# Patient Record
Sex: Male | Born: 1935 | Race: Black or African American | Hispanic: No | Marital: Married | Smoking: Former smoker
Health system: Southern US, Community
[De-identification: ages and names within clinical notes are randomized; demographics above are authoritative.]

## PROBLEM LIST (undated history)

## (undated) DIAGNOSIS — J309 Allergic rhinitis, unspecified: Secondary | ICD-10-CM

## (undated) DIAGNOSIS — K6389 Other specified diseases of intestine: Secondary | ICD-10-CM

## (undated) DIAGNOSIS — J439 Emphysema, unspecified: Secondary | ICD-10-CM

## (undated) DIAGNOSIS — I10 Essential (primary) hypertension: Secondary | ICD-10-CM

## (undated) DIAGNOSIS — G629 Polyneuropathy, unspecified: Secondary | ICD-10-CM

## (undated) DIAGNOSIS — R269 Unspecified abnormalities of gait and mobility: Secondary | ICD-10-CM

## (undated) DIAGNOSIS — I4510 Unspecified right bundle-branch block: Secondary | ICD-10-CM

## (undated) HISTORY — DX: Essential (primary) hypertension: I10

## (undated) HISTORY — DX: Polyneuropathy, unspecified: G62.9

## (undated) HISTORY — DX: Unspecified abnormalities of gait and mobility: R26.9

---
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
## 2011-08-25 ENCOUNTER — Encounter (HOSPITAL_COMMUNITY): Payer: Self-pay | Admitting: Emergency Medicine

## 2011-08-25 ENCOUNTER — Emergency Department (INDEPENDENT_AMBULATORY_CARE_PROVIDER_SITE_OTHER)
Admission: EM | Admit: 2011-08-25 | Discharge: 2011-08-25 | Disposition: A | Discharge status: Home Health | Payer: Medicare Other | Source: Home / Self Care | Attending: Family Medicine | Admitting: Family Medicine

## 2011-08-25 DIAGNOSIS — H6980 Other specified disorders of Eustachian tube, unspecified ear: Secondary | ICD-10-CM

## 2011-08-25 DIAGNOSIS — J309 Allergic rhinitis, unspecified: Secondary | ICD-10-CM

## 2011-08-25 MED ORDER — LORATADINE 10 MG PO TABS: 10.0000 mg | ORAL_TABLET | Freq: Every day | ORAL | Status: DC

## 2011-08-25 MED ORDER — FLUTICASONE PROPIONATE 50 MCG/ACT NA SUSP: 2.0000 | Freq: Every day | NASAL | Status: DC

## 2011-08-25 NOTE — ED Provider Notes (Signed)
History     CSN: [id number]  Arrival date & time 08/25/11  0904   First MD Initiated Contact with Patient 08/25/11 1003      Chief Complaint  Patient presents with  . Dizziness  . Allergies    (Consider location/radiation/quality/duration/timing/severity/associated sxs/prior treatment) HPI Comments: Darryl Johns presents for evaluation of nasal congestion, right ear fullness, and red, watery eyes. He reports a symptoms over the last month they have worsened over the last week. He denies any history of seasonal allergies. But under further review. He does admit to be symptoms recurring with changes of season or temperature. He denies any fever. He denies any cough.  Patient is a 77 y.o. male presenting with URI. The history is provided by the patient.  URI The primary symptoms include headaches and ear pain. Primary symptoms do not include fever, sore throat, cough, nausea or vomiting. The current episode started more than 1 week ago. The problem has not changed since onset. The ear pain began more than 2 days ago. Ear pain is a new problem. The right ear is affected.  Symptoms associated with the illness include plugged ear sensation, sinus pressure and congestion. The illness is not associated with chills.    History reviewed. No pertinent past medical history.  History reviewed. No pertinent past surgical history.  No family history on file.  History  Substance Use Topics  . Smoking status: Current Everyday Smoker  . Smokeless tobacco: Not on file  . Alcohol Use: Yes      Review of Systems  Constitutional: Negative.  Negative for fever and chills.  HENT: Positive for ear pain, congestion and sinus pressure. Negative for hearing loss and sore throat.   Eyes: Positive for redness.  Respiratory: Negative.  Negative for cough.   Cardiovascular: Negative.   Gastrointestinal: Negative.  Negative for nausea and vomiting.  Genitourinary: Negative.   Musculoskeletal: Negative.     Skin: Negative.   Neurological: Positive for headaches.    Allergies  Review of patient's allergies indicates no known allergies.  Home Medications   Current Outpatient Rx  Name Route Sig Dispense Refill  . FLUTICASONE PROPIONATE 50 MCG/ACT NA SUSP Nasal Place 2 sprays into the nose daily. 16 g 2  . LORATADINE 10 MG PO TABS Oral Take 1 tablet (10 mg total) by mouth daily. 30 tablet 0    BP 146/67  Pulse 84  Temp(Src) 97.5 F (36.4 C) (Oral)  Resp 16  SpO2 100%  Physical Exam  Nursing note and vitals reviewed. Constitutional: He is oriented to person, place, and time. He appears well-developed and well-nourished.  HENT:  Head: Normocephalic and atraumatic.  Right Ear: Tympanic membrane is retracted.  Left Ear: Tympanic membrane is retracted.  Mouth/Throat: Uvula is midline, oropharynx is clear and moist and mucous membranes are normal.  Eyes: EOM are normal.  Neck: Normal range of motion.  Cardiovascular: Normal rate and regular rhythm.   Pulmonary/Chest: Effort normal and breath sounds normal. He has no wheezes. He has no rales.  Musculoskeletal: Normal range of motion.  Neurological: He is alert and oriented to person, place, and time.  Skin: Skin is warm and dry.  Psychiatric: His behavior is normal.    ED Course  Procedures (including critical care time)  Labs Reviewed - No data to display No results found.   1. Eustachian tube dysfunction   2. Allergic rhinitis       MDM  rx given for fluticasone and loratadine  Richardo Priest, MD 08/25/11 1123

## 2011-08-25 NOTE — ED Notes (Signed)
PT HERE WITH SX RIGHT EAR THROB,SHARP PAINS WITH WATERY RED EYES AND DIZZINESS THAT HAS BEEN ONGOING BUT HAS  WORSENED IN THE LAST WEEK.DENIES N/V.PT WENT TO PCP 6 MNTHS AGO AND HAD EAR IRRIGATION.Marland Kitchen

## 2011-08-25 NOTE — Discharge Instructions (Signed)
Use the prescribed nasal spray as directed and continue loratadine daily for the next week or so. Use an over the counter saline solution or eye moisturizer such as Natural Tears. Also, stay hydrated with clear liquids. Return to care should your symptoms not improve, or worsen in any way, such as fever.

## 2014-12-15 ENCOUNTER — Telehealth: Payer: Self-pay | Admitting: *Deleted

## 2014-12-15 ENCOUNTER — Telehealth: Payer: Self-pay | Admitting: Internal Medicine

## 2014-12-15 NOTE — Telephone Encounter (Signed)
Received records from [facility] for appointment with Dr Debara Pickett on 12/22/14.  Records given to [facility] (medical records) for Dr Lysbeth Penner schedule on 12/22/14. lp

## 2014-12-15 NOTE — Telephone Encounter (Signed)
Incoming call from Dr. Kristie Cowman for new patient referral.  New patient for Dr. Ronnald Ramp, presented today w/ c/o dizziness upon standing. Pt has no medical history.   Dr. Ronnald Ramp noted concern for elevated BP (172/72). Did not initiate any meds today - also concerned for abnormal EKG and wanted to have cardiology evaluate. Requested visit here w/in 1 week if possible.  Reached out to patient, confirmed name/DOB/address/contact info - noted already has chart w/in our system d/t historical urgent care visit.  Spoke w/ Eliezer Lofts and Abe People, patient was added to Dr. Lysbeth Penner calendar for 6/27 for new patient visit.  Fax received today from [facility], will bring to medical records for scanning to electronic record.

## 2014-12-22 ENCOUNTER — Encounter: Payer: Self-pay | Admitting: Internal Medicine

## 2014-12-22 ENCOUNTER — Ambulatory Visit (INDEPENDENT_AMBULATORY_CARE_PROVIDER_SITE_OTHER): Payer: Medicare HMO | Admitting: Internal Medicine

## 2014-12-22 VITALS — BP 173/79 | HR 96 | Ht 74.0 in | Wt 173.0 lb

## 2014-12-22 DIAGNOSIS — I10 Essential (primary) hypertension: Secondary | ICD-10-CM | POA: Diagnosis not present

## 2014-12-22 DIAGNOSIS — R42 Dizziness and giddiness: Secondary | ICD-10-CM

## 2014-12-22 DIAGNOSIS — I4510 Unspecified right bundle-branch block: Secondary | ICD-10-CM

## 2014-12-22 MED ORDER — VALSARTAN-HYDROCHLOROTHIAZIDE 160-12.5 MG PO TABS: 1.0000 | ORAL_TABLET | Freq: Every day | ORAL | Status: DC

## 2014-12-22 NOTE — Patient Instructions (Addendum)
Your physician has requested that you have an echocardiogram. Echocardiography is a painless test that uses sound waves to create images of your heart. It provides your doctor with information about the size and shape of your heart and how well your heart's chambers and valves are working. This procedure takes approximately one hour. There are no restrictions for this procedure.  Your physician has recommended you make the following change in your medication: START valsartan-hctz - for blood pressure - take in the morning  Your physician recommends that you schedule a follow-up appointment after your test.

## 2014-12-23 ENCOUNTER — Encounter: Payer: Self-pay | Admitting: Internal Medicine

## 2014-12-23 DIAGNOSIS — R42 Dizziness and giddiness: Secondary | ICD-10-CM | POA: Insufficient documentation

## 2014-12-23 DIAGNOSIS — I10 Essential (primary) hypertension: Secondary | ICD-10-CM | POA: Insufficient documentation

## 2014-12-23 DIAGNOSIS — I4510 Unspecified right bundle-branch block: Secondary | ICD-10-CM | POA: Insufficient documentation

## 2014-12-23 NOTE — Progress Notes (Signed)
OFFICE NOTE  Chief Complaint:  Dizziness, abnormal EKG  Primary Care Physician: Gennette Pac, MD  HPI:  Darryl Johns is a pleasant 80 year old male who is coming referred for evaluation of an abnormal EKG and dizziness. Recently he's been noted to be hypertensive with blood pressures the 833A to 250N systolic. He is also reported dizziness when standing and difficulty with a normal gait 1 walking. He reports he is actually more dizzy when laying down in bed at night rather than sitting up. Orthostatic blood pressures were obtained and they were 189/94 sitting, 194/77 lying and 173/79 standing. There is no significant difference in pulse change. He did not become dizzy when sitting up. He does report however standing up and walking for a few steps he becomes dizzy. He denies any chest pain or worsening shortness of breath. He occasionally has headache and does take Advil. He was also noted on a recent EKG to have an RSR pattern which is suggestive of right bundle branch block.  PMHx:  Past Medical History  Diagnosis Date  . Hypertension     No past surgical history on file.  FAMHx:  Family History  Problem Relation Age of Onset  . Stroke Mother     SOCHx:   reports that he has been smoking.  He does not have any smokeless tobacco history on file. He reports that he drinks alcohol. He reports that he does not use illicit drugs.  ALLERGIES:  No Known Allergies  ROS: A comprehensive review of systems was negative except for: Neurological: positive for dizziness and gait problems  HOME MEDS: Current Outpatient Prescriptions  Medication Sig Dispense Refill  . Ibuprofen (ADVIL) 200 MG CAPS Take by mouth as needed.    . valsartan-hydrochlorothiazide (DIOVAN-HCT) 160-12.5 MG per tablet Take 1 tablet by mouth daily. 30 tablet 6   No current facility-administered medications for this visit.    LABS/IMAGING: No results found for this or any previous visit (from the past  48 hour(s)). No results found.  WEIGHTS: Wt Readings from Last 3 Encounters:  12/22/14 173 lb (78.472 kg)    VITALS: BP 173/79 mmHg  Pulse 96  Ht 6\' 2"  (1.88 m)  Wt 173 lb (78.472 kg)  BMI 22.20 kg/m2  EXAM: General appearance: alert and no distress Neck: no carotid bruit and no JVD Lungs: clear to auscultation bilaterally Heart: regular rate and rhythm, S1, S2 normal, no murmur, click, rub or gallop Abdomen: soft, non-tender; bowel sounds normal; no masses,  no organomegaly Extremities: extremities normal, atraumatic, no cyanosis or edema Pulses: 2+ and symmetric Skin: Skin color, texture, turgor normal. No rashes or lesions Neurologic: Mental status: Alert, oriented, thought content appropriate Coordination: Romberg test abnormal Gait: Antalgic Psych: Pleasant  EKG: Normal sinus rhythm at 88, right bundle branch block  ASSESSMENT: 1. Abnormal gait and dizziness with an abnormal Romberg test, suggesting possible posterior column symptoms/neuropathy as a cause 2. History of long-term moderate alcohol use 3. Right bundle branch block 4. Uncontrolled hypertension  PLAN: 1.   Mr. Teo has uncontrolled hypertension which could be playing a role in some of his symptoms. I recommend starting him on Diovan HCTZ 160/12.5 mg daily. Neurologically, his exam is consistent with a possible posterior column or neuropathic cause of his dizziness. He also reports difficulty with gait. He does have a history of moderate alcohol use and may have some neuropathy that is leading to his imbalance. I suggest a neurologic evaluation for his dizziness and gait instability. He  is not describing orthostatic hypotension, in fact he is more dizzy when laying down rather than sitting up. His orthostatics in the office were negative. I've also recommend an echocardiogram to rule out a cardiomyopathy, especially given a history of alcohol use and his abnormal EKG.  Plan to see him back to discuss the  results of the echo and recheck his blood pressure and I will adjust his medication accordingly. Thanks as always for the kind referral.  Pixie Casino, MD, [facility] Attending Cardiologist [place] 12/23/2014, 6:19 PM

## 2015-01-01 ENCOUNTER — Ambulatory Visit (HOSPITAL_COMMUNITY)
Admission: RE | Admit: 2015-01-01 | Discharge: 2015-01-01 | Disposition: A | Discharge status: Home / Self Care | Payer: Medicare HMO | Source: Ambulatory Visit | Attending: Cardiology | Admitting: Cardiology

## 2015-01-01 DIAGNOSIS — I4510 Unspecified right bundle-branch block: Secondary | ICD-10-CM | POA: Diagnosis not present

## 2015-01-01 DIAGNOSIS — I10 Essential (primary) hypertension: Secondary | ICD-10-CM | POA: Diagnosis not present

## 2015-01-01 DIAGNOSIS — R42 Dizziness and giddiness: Secondary | ICD-10-CM

## 2015-01-16 ENCOUNTER — Ambulatory Visit (INDEPENDENT_AMBULATORY_CARE_PROVIDER_SITE_OTHER): Payer: Medicare HMO | Admitting: Internal Medicine

## 2015-01-16 ENCOUNTER — Encounter: Payer: Self-pay | Admitting: Internal Medicine

## 2015-01-16 VITALS — BP 148/74 | HR 88 | Ht 74.0 in | Wt 174.3 lb

## 2015-01-16 DIAGNOSIS — I4510 Unspecified right bundle-branch block: Secondary | ICD-10-CM

## 2015-01-16 DIAGNOSIS — I10 Essential (primary) hypertension: Secondary | ICD-10-CM | POA: Diagnosis not present

## 2015-01-16 DIAGNOSIS — R42 Dizziness and giddiness: Secondary | ICD-10-CM

## 2015-01-16 MED ORDER — VALSARTAN-HYDROCHLOROTHIAZIDE 160-12.5 MG PO TABS: 1.0000 | ORAL_TABLET | Freq: Every day | ORAL | Status: DC

## 2015-01-16 NOTE — Patient Instructions (Signed)
Your physician recommends that you schedule a follow-up appointment as needed  

## 2015-01-16 NOTE — Progress Notes (Signed)
OFFICE NOTE  Chief Complaint:  Dizziness, abnormal EKG  Primary Care Physician: Andria Frames, MD  HPI:  Darryl Johns is a pleasant 80 year old male who is coming referred for evaluation of an abnormal EKG and dizziness. Recently he's been noted to be hypertensive with blood pressures the 335K to 562B systolic. He is also reported dizziness when standing and difficulty with a normal gait 1 walking. He reports he is actually more dizzy when laying down in bed at night rather than sitting up. Orthostatic blood pressures were obtained and they were 189/94 sitting, 194/77 lying and 173/79 standing. There is no significant difference in pulse change. He did not become dizzy when sitting up. He does report however standing up and walking for a few steps he becomes dizzy. He denies any chest pain or worsening shortness of breath. He occasionally has headache and does take Advil. He was also noted on a recent EKG to have an RSR pattern which is suggestive of right bundle branch block.  Darryl Johns returns today with an improvement in his blood pressure on Diovan HCTZ. The blood pressure is 148/74. He's had no chest pain or shortness of breath. He still has some positional dizziness. There is no evidence for orthostatic hypotension. Echocardiogram shows normal systolic function mild diastolic dysfunction without any significant valvular disease. I suspect that his dizziness may be neurologic in origin.  PMHx:  Past Medical History  Diagnosis Date  . Hypertension     No past surgical history on file.  FAMHx:  Family History  Problem Relation Age of Onset  . Stroke Mother     SOCHx:   reports that he has been smoking.  He does not have any smokeless tobacco history on file. He reports that he drinks alcohol. He reports that he does not use illicit drugs.  ALLERGIES:  No Known Allergies  ROS: A comprehensive review of systems was negative except for: Neurological: positive for  dizziness and gait problems  HOME MEDS: Current Outpatient Prescriptions  Medication Sig Dispense Refill  . Ibuprofen (ADVIL) 200 MG CAPS Take by mouth as needed.    . valsartan-hydrochlorothiazide (DIOVAN-HCT) 160-12.5 MG per tablet Take 1 tablet by mouth daily. 30 tablet 11   No current facility-administered medications for this visit.    LABS/IMAGING: No results found for this or any previous visit (from the past 48 hour(s)). No results found.  WEIGHTS: Wt Readings from Last 3 Encounters:  01/16/15 174 lb 4.8 oz (79.062 kg)  12/22/14 173 lb (78.472 kg)    VITALS: BP 148/74 mmHg  Pulse 88  Ht 6\' 2"  (1.88 m)  Wt 174 lb 4.8 oz (79.062 kg)  BMI 22.37 kg/m2  EXAM: Deferred  EKG: Deferred  ASSESSMENT: 1. Abnormal gait and dizziness with an abnormal Romberg test, suggesting possible posterior column symptoms/neuropathy as a cause 2. History of long-term moderate alcohol use 3. Right bundle branch block - normal LVEF on echo with mild diastolic dysfunction 4. Hypertension - controlled  PLAN: 1.   Darryl Johns has normal systolic function mild diastolic dysfunction on his echo. There is no evidence for ischemia and he said no chest pain. His blood pressure now is better controlled on Diovan HCTZ. We have given a prescription to continue this. Follow-up with [place] can be on an as-needed basis. His primary care provider could continue to prescribe him his high blood pressure medication. He may likely need a referral to neurology for dizziness and I offered to do that today however  he would like Dr. Ronnald Ramp to do that form.  Thanks again for allowing me to participate in his care. I'm happy to see him back as necessary.  Pixie Casino, MD, [facility] Attending Cardiologist [place] 01/16/2015, 3:59 PM

## 2016-01-18 ENCOUNTER — Other Ambulatory Visit: Payer: Self-pay | Admitting: Internal Medicine

## 2016-12-19 ENCOUNTER — Other Ambulatory Visit: Payer: Self-pay | Admitting: Internal Medicine

## 2016-12-19 NOTE — Telephone Encounter (Signed)
REFILL 

## 2017-01-21 ENCOUNTER — Other Ambulatory Visit: Payer: Self-pay | Admitting: Internal Medicine

## 2017-01-23 MED ORDER — IRBESARTAN 150 MG PO TABS: 150.0000 mg | ORAL_TABLET | Freq: Every day | ORAL | 1 refills | Status: DC

## 2017-01-23 MED ORDER — HYDROCHLOROTHIAZIDE 12.5 MG PO CAPS: 12.5000 mg | ORAL_CAPSULE | Freq: Every day | ORAL | 3 refills | Status: DC

## 2017-01-23 NOTE — Telephone Encounter (Signed)
Due to recall pt changed to irbesartan 150mg  HCTZ 12.5

## 2017-01-23 NOTE — Telephone Encounter (Signed)
Pt notified refill sent Patient does not have home cuff, patient come into office in 2 weeks for a BP check- 8-14 nurse visit pt notified appt scheduled

## 2017-01-24 ENCOUNTER — Telehealth: Payer: Self-pay | Admitting: Internal Medicine

## 2017-01-24 NOTE — Telephone Encounter (Signed)
Left message for pt to call back-BP appt and called pharmacy to cancel rx and informed them to have him call PCP since he is no longer a pt here.

## 2017-01-24 NOTE — Telephone Encounter (Signed)
New Message  Darryl Johns call from [facility] stating they did not receive the new prescription for pts Valsartan. Please call back to discuss

## 2017-01-25 NOTE — Telephone Encounter (Signed)
S/w pt notified to call PCP and discuss valsartan since last OV note states f/u PRN.

## 2017-02-07 ENCOUNTER — Ambulatory Visit: Payer: Self-pay

## 2017-11-24 ENCOUNTER — Telehealth: Payer: Self-pay

## 2017-11-24 NOTE — Telephone Encounter (Signed)
Phone call placed to patient's daughter, Jonelle Sidle to offer to schedule a visit with Palliative care. VM left.

## 2018-05-14 ENCOUNTER — Encounter (HOSPITAL_COMMUNITY): Payer: Self-pay | Admitting: Emergency Medicine

## 2018-05-14 ENCOUNTER — Ambulatory Visit (HOSPITAL_COMMUNITY)
Admission: EM | Admit: 2018-05-14 | Discharge: 2018-05-14 | Disposition: A | Discharge status: Home / Self Care | Payer: Medicare HMO | Attending: Emergency Medicine | Admitting: Emergency Medicine

## 2018-05-14 DIAGNOSIS — I4510 Unspecified right bundle-branch block: Secondary | ICD-10-CM

## 2018-05-14 DIAGNOSIS — R42 Dizziness and giddiness: Secondary | ICD-10-CM | POA: Diagnosis not present

## 2018-05-14 DIAGNOSIS — I951 Orthostatic hypotension: Secondary | ICD-10-CM | POA: Diagnosis not present

## 2018-05-14 DIAGNOSIS — Z8679 Personal history of other diseases of the circulatory system: Secondary | ICD-10-CM

## 2018-05-14 LAB — POCT I-STAT, CHEM 8
BUN: 16 mg/dL (ref 8–23)
CREATININE: 1.2 mg/dL (ref 0.61–1.24)
Calcium, Ion: 1.29 mmol/L (ref 1.15–1.40)
Chloride: 99 mmol/L (ref 98–111)
Glucose, Bld: 142 mg/dL — ABNORMAL HIGH (ref 70–99)
HEMATOCRIT: 47 % (ref 39.0–52.0)
HEMOGLOBIN: 16 g/dL (ref 13.0–17.0)
Potassium: 4.4 mmol/L (ref 3.5–5.1)
SODIUM: 137 mmol/L (ref 135–145)
TCO2: 31 mmol/L (ref 22–32)

## 2018-05-14 NOTE — ED Triage Notes (Signed)
Pt here for dizziness x greater than a month; pt has seen PCP and was to follow up with neurologist which pt didn't keep appt due to his brother passing away

## 2018-05-14 NOTE — Discharge Instructions (Signed)
Please call and make appointment with your cardiologist for follow up on your Orthostatic Hypotension and tachycardia.  Increase how much fluids you are drinking as dehydration can contribute.  Please follow up with your primary care provider for continued management.  If develop worsening of symptoms, worsening of dizziness, headache, chest pain , palpitations, feel like you are going to pass out please go to the Er.

## 2018-05-14 NOTE — ED Provider Notes (Signed)
[facility]    CSN: [id number] Arrival date & time: 05/14/18  1309     History   Chief Complaint Chief Complaint  Patient presents with  . Dizziness    HPI Darryl Johns is a 83 y.o. male.   Darryl Johns presents with complaints of dizziness. Worse if he transitions from laying to sitting or sitting to standing. Once he is up ambulating the symptoms improve. Doesn't have a spinning sensation but just feels dizzy. Symptoms started at least in September, saw his PCP and was referred to neurology. He missed that appointment as he was caring for his twin brother who ultimately passed away. His symptoms had improved. Over the past week the symptoms have returned, although have somewhat improved. No fall, no head injury. Denies  Any URI symptoms. No nausea, vomiting or diarrhea. Sometimes has right ear pain. No chest pain  Or palpitations. He does take allergy medication and a blood pressure medication. No other medications for symptoms. Never has symptoms if laying flat and minimal symptoms when sitting at rest. Hx of htn, RBBB.     ROS per HPI.      Past Medical History:  Diagnosis Date  . Hypertension     Patient Active Problem List   Diagnosis Date Noted  . Essential hypertension 12/23/2014  . RBBB 12/23/2014  . Dizziness 12/23/2014    History reviewed. No pertinent surgical history.     Home Medications    Prior to Admission medications   Medication Sig Start Date End Date Taking? Authorizing Provider  Ibuprofen (ADVIL) 200 MG CAPS Take by mouth as needed.    [provider]    Family History Family History  Problem Relation Age of Onset  . Stroke Mother     Social History Social History   Tobacco Use  . Smoking status: Current Every Day Smoker  Substance Use Topics  . Alcohol use: Yes  . Drug use: No     Allergies   Patient has no known allergies.   Review of Systems Review of Systems   Physical Exam Triage Vital  Signs ED Triage Vitals [05/14/18 1417]  Enc Vitals Group     BP (!) 156/78     Pulse Rate 94     Resp 18     Temp 98.2 F (36.8 C)     Temp Source Oral     SpO2 99 %     Weight      Height      Head Circumference      Peak Flow      Pain Score 0     Pain Loc      Pain Edu?      Excl. in [place]?    Orthostatic VS for the past 24 hrs:  BP- Lying Pulse- Lying BP- Sitting Pulse- Sitting BP- Standing at 0 minutes Pulse- Standing at 0 minutes  05/14/18 1600 168/89 104 142/66 103 139/75 115    Updated Vital Signs BP (!) 156/78 (BP Location: Left Arm)   Pulse 94   Temp 98.2 F (36.8 C) (Oral)   Resp 18   SpO2 99%    Physical Exam  Constitutional: He is oriented to person, place, and time. He appears well-developed and well-nourished.  HENT:  Head: Normocephalic and atraumatic.  Right Ear: Tympanic membrane, external ear and ear canal normal.  Left Ear: Tympanic membrane, external ear and ear canal normal.  Nose: Nose normal. Right sinus exhibits no maxillary sinus tenderness and no  frontal sinus tenderness. Left sinus exhibits no maxillary sinus tenderness and no frontal sinus tenderness.  Mouth/Throat: Uvula is midline, oropharynx is clear and moist and mucous membranes are normal.  Eyes: Pupils are equal, round, and reactive to light. Conjunctivae are normal.  Neck: Normal range of motion.  Cardiovascular: Regular rhythm. Tachycardia present.  Pulmonary/Chest: Effort normal and breath sounds normal.  Lymphadenopathy:    He has no cervical adenopathy.  Neurological: He is alert and oriented to person, place, and time. No cranial nerve deficit or sensory deficit. Coordination normal.  Skin: Skin is warm and dry.  Vitals reviewed.  EKG with RBBB and sinus tach, reviewed with Dr. Meda Coffee and compared to previous EKG from 11/2014.  UC Treatments / Results  Labs (all labs ordered are listed, but only abnormal results are displayed) Labs Reviewed  POCT I-STAT, CHEM 8 - Abnormal;  Notable for the following components:      Result Value   Glucose, Bld 142 (*)    All other components within normal limits    EKG None  Radiology No results found.  Procedures Procedures (including critical care time)  Medications Ordered in UC Medications - No data to display  Initial Impression / Assessment and Plan / UC Course  I have reviewed the triage vital signs and the nursing notes.  Pertinent labs & imaging results that were available during my care of the patient were reviewed by me and considered in my medical decision making (see chart for details).     Chem 8 reassuring. Physical exam is reassuring. No acute neurological findings. ekg and orthostatics with tachycardia and orthostatic hypotension noted. Pharmacy contacted and patient does take losartan hct, question some dehydration? Patient states he sucks on hard candies regularly. Encouraged increased fluid intake. No chest pain, states that symptoms have been stable if not improving for the past month. Hx of RBBB noted on previous EKGs as well. Has not seen cardiology since 2016. Per chart review did have somewhat similar symptoms even then. Missed neurology appointment. Encouraged close follow up with PCP and cardiology. Strict return precautions provided. Patient and wife verbalized understanding and agreeable to plan.  Ambulatory out of clinic without difficulty.   Final Clinical Impressions(s) / UC Diagnoses   Final diagnoses:  Orthostatic hypotension  RBBB  Dizziness     Discharge Instructions     Please call and make appointment with your cardiologist for follow up on your Orthostatic Hypotension and tachycardia.  Increase how much fluids you are drinking as dehydration can contribute.  Please follow up with your primary care provider for continued management.  If develop worsening of symptoms, worsening of dizziness, headache, chest pain , palpitations, feel like you are going to pass out please go to  the Er.     ED Prescriptions    None     Controlled Substance Prescriptions [place] Controlled Substance Registry consulted? Not Applicable   Zigmund Gottron, NP 05/14/18 1650

## 2018-05-17 ENCOUNTER — Encounter: Payer: Self-pay | Admitting: Internal Medicine

## 2018-05-17 ENCOUNTER — Ambulatory Visit: Payer: Medicare HMO | Admitting: Internal Medicine

## 2018-05-17 VITALS — BP 135/71 | HR 89 | Wt 169.0 lb

## 2018-05-17 DIAGNOSIS — I4510 Unspecified right bundle-branch block: Secondary | ICD-10-CM | POA: Diagnosis not present

## 2018-05-17 DIAGNOSIS — I951 Orthostatic hypotension: Secondary | ICD-10-CM | POA: Diagnosis not present

## 2018-05-17 DIAGNOSIS — I959 Hypotension, unspecified: Secondary | ICD-10-CM | POA: Insufficient documentation

## 2018-05-17 MED ORDER — LOSARTAN POTASSIUM 50 MG PO TABS: 50.0000 mg | ORAL_TABLET | Freq: Every day | ORAL | 3 refills | Status: AC

## 2018-05-17 NOTE — Progress Notes (Signed)
OFFICE NOTE  Chief Complaint:  Dizziness, orthostatic hypotension  Primary Care Physician: Kristie Cowman, MD  HPI:  Darryl Johns is a pleasant 83 year old male who is coming referred for evaluation of an abnormal EKG and dizziness. Recently he's been noted to be hypertensive with blood pressures the 474Q to 595G systolic. He is also reported dizziness when standing and difficulty with a normal gait 1 walking. He reports he is actually more dizzy when laying down in bed at night rather than sitting up. Orthostatic blood pressures were obtained and they were 189/94 sitting, 194/77 lying and 173/79 standing. There is no significant difference in pulse change. He did not become dizzy when sitting up. He does report however standing up and walking for a few steps he becomes dizzy. He denies any chest pain or worsening shortness of breath. He occasionally has headache and does take Advil. He was also noted on a recent EKG to have an RSR pattern which is suggestive of right bundle branch block.  01/16/2015 Darryl Johns returns today with an improvement in his blood pressure on Diovan HCTZ. The blood pressure is 148/74. He's had no chest pain or shortness of breath. He still has some positional dizziness. There is no evidence for orthostatic hypotension. Echocardiogram shows normal systolic function mild diastolic dysfunction without any significant valvular disease. I suspect that his dizziness may be neurologic in origin.  05/17/2018 Darryl Johns returns today for follow-up.  I last saw him more than 3 years ago therefore is considered a new patient.  He was seen at the time for orthostatic hypotension and dizziness.  He had a newly found right bundle branch block.  He was also hypertensive and had been started on Diovan HCTZ.  Subsequently switched probably to losartan HCTZ due to recalls.  He has had some worsening orthostasis and recently was seen in the ER for this.  He was orthostatic positive.   It could be that is related to his diuretic.  There may also be some autonomic dysfunction.  He is apparently going to be sent to neurology.  I had also noted the time he had abnormal gait and a positive Romberg test.  PMHx:  Past Medical History:  Diagnosis Date  . Hypertension     No past surgical history on file.  FAMHx:  Family History  Problem Relation Age of Onset  . Stroke Mother     SOCHx:   reports that he has been smoking. He has never used smokeless tobacco. He reports that he drinks alcohol. He reports that he does not use drugs.  ALLERGIES:  No Known Allergies  ROS: Pertinent items noted in HPI and remainder of comprehensive ROS otherwise negative.  HOME MEDS: Current Outpatient Medications  Medication Sig Dispense Refill  . Ibuprofen (ADVIL) 200 MG CAPS Take by mouth as needed.    Marland Kitchen losartan-hydrochlorothiazide (HYZAAR) 50-12.5 MG tablet Take 1 tablet by mouth daily.  0   No current facility-administered medications for this visit.     LABS/IMAGING: No results found for this or any previous visit (from the past 48 hour(s)). No results found.  WEIGHTS: Wt Readings from Last 3 Encounters:  05/17/18 169 lb (76.7 kg)  01/16/15 174 lb 4.8 oz (79.1 kg)  12/22/14 173 lb (78.5 kg)    VITALS: Wt 169 lb (76.7 kg)   BMI 21.70 kg/m   EXAM: General appearance: alert, no distress and thin Neck: no carotid bruit, no JVD and thyroid not enlarged, symmetric, no tenderness/mass/nodules Lungs:  clear to auscultation bilaterally Heart: regular rate and rhythm, S1, S2 normal, no murmur, click, rub or gallop Abdomen: soft, non-tender; bowel sounds normal; no masses,  no organomegaly Extremities: extremities normal, atraumatic, no cyanosis or edema Pulses: 2+ and symmetric Skin: Skin color, texture, turgor normal. No rashes or lesions Neurologic: Grossly normal Psych: Pleasant  EKG: Sinus tachycardia with right bundle branch block-ER EKG (personally  reviewed)  ASSESSMENT: 1. Orthostatic hypotension 2. Abnormal gait and dizziness with an abnormal Romberg test, suggesting possible posterior column symptoms/neuropathy as a cause 3. History of long-term moderate alcohol use 4. Right bundle branch block - normal LVEF on echo with mild diastolic dysfunction 5. Hypertension - controlled  PLAN: 1.   Darryl Johns has had similar symptoms to when I saw him more than 3 years ago.  This is orthostasis with change in blood pressure that is positional.  He has instability related to this.  He does have a history of long-term moderate alcohol use and may have neuropathy which is contributing to his symptoms.  He had some posterior column symptoms on exam and would benefit from formal neurologic evaluation.  We did do an echocardiogram in 2016 which showed normal LV systolic function mild diastolic dysfunction.  Family says that his alcohol use has declined significantly.  His right bundle branch block appears stable and blood pressure is controlled.  I recommended stopping his HCTZ today and will provide a prescription for losartan 50 mg daily.  Plan repeat echo and follow-up with me in a month.  Darryl Casino, MD, [facility], [place] Director of the Advanced Lipid Disorders &  Cardiovascular Risk Reduction Clinic Diplomate of the American Board of Clinical Lipidology Attending Cardiologist  Direct Dial: [phone]  Fax: [phone]  Website:  [email or web address] 05/17/2018, 3:19 PM

## 2018-05-17 NOTE — Patient Instructions (Signed)
Medication Instructions:  STOP losartan-hydrochlorothiazide START losartan 50mg  daily If you need a refill on your cardiac medications before your next appointment, please call your pharmacy.   Lab work: NONE  Testing/Procedures: Your physician has requested that you have an echocardiogram. Echocardiography is a painless test that uses sound waves to create images of your heart. It provides your doctor with information about the size and shape of your heart and how well your heart's chambers and valves are working. This procedure takes approximately one hour. There are no restrictions for this procedure. -- done at [address] - [address]  Follow-Up: At [facility], you and your health needs are our priority.  As part of our continuing mission to provide you with exceptional heart care, we have created designated Provider Care Teams.  These Care Teams include your primary Cardiologist (physician) and Advanced Practice Providers (APPs -  Physician Assistants and Nurse Practitioners) who all work together to provide you with the care you need, when you need it. You will need a follow up appointment in 4 weeks.  You may see Dr. Debara Pickett or one of the following Advanced Practice Providers on your designated Care Team: Almyra Deforest, [place] . Fabian Sharp, PA-C  Any Other Special Instructions Will Be Listed Below (If Applicable).

## 2018-05-28 ENCOUNTER — Other Ambulatory Visit: Payer: Self-pay

## 2018-05-28 ENCOUNTER — Ambulatory Visit (HOSPITAL_COMMUNITY): Payer: Medicare HMO | Attending: Cardiovascular Disease

## 2018-05-28 DIAGNOSIS — I4510 Unspecified right bundle-branch block: Secondary | ICD-10-CM | POA: Insufficient documentation

## 2018-05-28 DIAGNOSIS — I951 Orthostatic hypotension: Secondary | ICD-10-CM | POA: Diagnosis not present

## 2018-06-11 ENCOUNTER — Ambulatory Visit: Payer: Medicare HMO | Admitting: Internal Medicine

## 2018-06-11 ENCOUNTER — Encounter: Payer: Self-pay | Admitting: Internal Medicine

## 2018-06-11 VITALS — BP 121/66 | HR 98 | Ht 74.0 in | Wt 166.4 lb

## 2018-06-11 DIAGNOSIS — I4510 Unspecified right bundle-branch block: Secondary | ICD-10-CM

## 2018-06-11 DIAGNOSIS — I951 Orthostatic hypotension: Secondary | ICD-10-CM | POA: Diagnosis not present

## 2018-06-11 NOTE — Patient Instructions (Signed)
Your physician recommends that you schedule a follow-up appointment as needed with Dr. Hilty.  

## 2018-06-11 NOTE — Progress Notes (Signed)
OFFICE NOTE  Chief Complaint:  Dizziness, orthostatic hypotension  Primary Care Physician: Kristie Cowman, MD  HPI:  Darryl Johns is a pleasant 83 year old male who is coming referred for evaluation of an abnormal EKG and dizziness. Recently he's been noted to be hypertensive with blood pressures the 270J to 500X systolic. He is also reported dizziness when standing and difficulty with a normal gait 1 walking. He reports he is actually more dizzy when laying down in bed at night rather than sitting up. Orthostatic blood pressures were obtained and they were 189/94 sitting, 194/77 lying and 173/79 standing. There is no significant difference in pulse change. He did not become dizzy when sitting up. He does report however standing up and walking for a few steps he becomes dizzy. He denies any chest pain or worsening shortness of breath. He occasionally has headache and does take Advil. He was also noted on a recent EKG to have an RSR pattern which is suggestive of right bundle branch block.  01/16/2015  Darryl Johns returns today with an improvement in his blood pressure on Diovan HCTZ. The blood pressure is 148/74. He's had no chest pain or shortness of breath. He still has some positional dizziness. There is no evidence for orthostatic hypotension. Echocardiogram shows normal systolic function mild diastolic dysfunction without any significant valvular disease. I suspect that his dizziness may be neurologic in origin.  05/17/2018  Darryl Johns returns today for follow-up.  I last saw him more than 3 years ago therefore is considered a new patient.  He was seen at the time for orthostatic hypotension and dizziness.  He had a newly found right bundle branch block.  He was also hypertensive and had been started on Diovan HCTZ.  Subsequently switched probably to losartan HCTZ due to recalls.  He has had some worsening orthostasis and recently was seen in the ER for this.  He was orthostatic  positive.  It could be that is related to his diuretic.  There may also be some autonomic dysfunction.  He is apparently going to be sent to neurology.  I had also noted the time he had abnormal gait and a positive Romberg test.  06/11/2018  Darryl Johns returns today for follow-up.  Overall he is doing well.  He denies any worsening dizziness or orthostatic hypotension.  His blood pressure has improved now and is well controlled on monotherapy losartan.  At 121/66.  He does have a referral for neurology but will be seen there in follow-up in February.  It may be that his hypotension was orthostasis.  Echo was reassuring with EF that was normal.  PMHx:  Past Medical History:  Diagnosis Date  . Hypertension     No past surgical history on file.  FAMHx:  Family History  Problem Relation Age of Onset  . Stroke Mother     SOCHx:   reports that he has been smoking. He has never used smokeless tobacco. He reports current alcohol use. He reports that he does not use drugs.  ALLERGIES:  No Known Allergies  ROS: Pertinent items noted in HPI and remainder of comprehensive ROS otherwise negative.  HOME MEDS: Current Outpatient Medications  Medication Sig Dispense Refill  . Ibuprofen (ADVIL) 200 MG CAPS Take by mouth as needed.    Marland Kitchen losartan (COZAAR) 50 MG tablet Take 1 tablet (50 mg total) by mouth daily. 90 tablet 3   No current facility-administered medications for this visit.     LABS/IMAGING: No results  found for this or any previous visit (from the past 48 hour(s)). No results found.  WEIGHTS: Wt Readings from Last 3 Encounters:  06/11/18 166 lb 6.4 oz (75.5 kg)  05/17/18 169 lb (76.7 kg)  01/16/15 174 lb 4.8 oz (79.1 kg)    VITALS: BP 121/66   Pulse 98   Ht 6\' 2"  (1.88 m)   Wt 166 lb 6.4 oz (75.5 kg)   BMI 21.36 kg/m   EXAM: Deferred  EKG: Deferred  ASSESSMENT: 1. Orthostatic hypotension 2. Abnormal gait and dizziness with an abnormal Romberg test,  suggesting possible posterior column symptoms/neuropathy as a cause 3. History of long-term moderate alcohol use 4. Right bundle branch block - normal LVEF on echo with mild diastolic dysfunction 5. Hypertension - controlled  PLAN: 1.   Darryl Johns has had improvement in his orthostatic hypotension.  I suspect this may be the main reason for his dizziness, but he does have a neurology appointment in February.  I encouraged him to keep that.  His blood pressure seems to be well-controlled at this point.  I will defer further management to his PCP.  Echo is assuredly normal despite right bundle branch block.  Follow-up with me as needed.  Pixie Casino, MD, [facility], [place] Director of the Advanced Lipid Disorders &  Cardiovascular Risk Reduction Clinic Diplomate of the American Board of Clinical Lipidology Attending Cardiologist  Direct Dial: [phone]  Fax: [phone]  Website:  [email or web address] 06/11/2018, 11:54 AM

## 2018-07-30 ENCOUNTER — Encounter: Payer: Self-pay | Admitting: Neurology

## 2018-07-30 ENCOUNTER — Ambulatory Visit: Payer: Medicare HMO | Admitting: Neurology

## 2018-07-30 VITALS — BP 110/64 | HR 92 | Ht 74.0 in | Wt 163.0 lb

## 2018-07-30 DIAGNOSIS — R5382 Chronic fatigue, unspecified: Secondary | ICD-10-CM | POA: Diagnosis not present

## 2018-07-30 DIAGNOSIS — R269 Unspecified abnormalities of gait and mobility: Secondary | ICD-10-CM | POA: Diagnosis not present

## 2018-07-30 DIAGNOSIS — H814 Vertigo of central origin: Secondary | ICD-10-CM

## 2018-07-30 DIAGNOSIS — E538 Deficiency of other specified B group vitamins: Secondary | ICD-10-CM | POA: Diagnosis not present

## 2018-07-30 DIAGNOSIS — R42 Dizziness and giddiness: Secondary | ICD-10-CM | POA: Diagnosis not present

## 2018-07-30 MED ORDER — ALPRAZOLAM 0.5 MG PO TABS: ORAL_TABLET | ORAL | 0 refills | Status: DC

## 2018-07-30 NOTE — Progress Notes (Addendum)
Reason for visit: Dizziness  Referring physician: Dr. Gerome Sam Kiner is a 84 y.o. male  History of present illness:  Darryl Johns is an 84 year old right-handed black male with a history of hypertension.  The patient has a 1 year history of some dizziness problems.  The patient himself reports that he has a sensation of being jittery when he is in a large group of people.  When he goes to church, he starts becoming nervous and jittery, he will have to get up and leave, if he is by himself he feels much better.  His family is also noted that he has had some problems with walking with his feet wide apart over the last year or year and a half, he has not had any falls.  The patient feels uncertain about walking over slick surfaces.  The patient has been touching walls frequently when he tries to walk about the house.  The patient reports some occasional numbness in the heels, no discomfort in the feet.  He denies any headaches, he has not had any vision changes.  He reports no true vertigo.  The patient has not had any falls, he denies issues controlling the bowels or the bladder.  The patient is sent to this office for an evaluation.  A CT scan of the brain was ordered with a carotid Doppler, this has not been done.  Past Medical History:  Diagnosis Date  . Hypertension     History reviewed. No pertinent surgical history.  Family History  Problem Relation Age of Onset  . Stroke Mother   . Lung cancer Brother   . Ovarian cancer Sister   . Cancer Brother     Social history:  reports that he has been smoking. He has never used smokeless tobacco. He reports current alcohol use. He reports that he does not use drugs.  Medications:  Prior to Admission medications   Medication Sig Start Date End Date Taking? Authorizing Provider  ARTIFICIAL TEAR OP Apply to eye.   Yes [provider]  fluticasone (FLONASE) 50 MCG/ACT nasal spray Place into both nostrils daily.   Yes  [provider]  Ibuprofen (ADVIL) 200 MG CAPS Take by mouth as needed.   Yes [provider]  losartan (COZAAR) 50 MG tablet Take 1 tablet (50 mg total) by mouth daily. 05/17/18  Yes Darryl Johns, Darryl Corwin, MD     No Known Allergies  ROS:  Out of a complete 14 system review of symptoms, the patient complains only of the following symptoms, and all other reviewed systems are negative.  Fevers and chills, fatigue Hearing loss, spinning sensations Blurred vision, eye pain Feeling hot, cold Muscle cramps Allergies, runny nose Numbness in the hands, difficulty swallowing, dizziness Anxiety Restless legs  Blood pressure 110/64, pulse 92, height 6\' 2"  (1.88 m), weight 163 lb (73.9 kg), SpO2 96 %.  Physical Exam  General: The patient is alert and cooperative at the time of the examination.  Eyes: Pupils are equal, round, and reactive to light. Discs are flat bilaterally.  Neck: The neck is supple, no carotid bruits are noted.  Respiratory: The respiratory examination is clear.  Cardiovascular: The cardiovascular examination reveals a regular rate and rhythm, no obvious murmurs or rubs are noted.  Skin: Extremities are without significant edema.  Neurologic Exam  Mental status: The patient is alert and oriented x 3 at the time of the examination. The patient has apparent normal recent and remote memory, with an  apparently normal attention span and concentration ability.  Cranial nerves: Facial symmetry is present. There is good sensation of the face to pinprick and soft touch bilaterally. The strength of the facial muscles and the muscles to head turning and shoulder shrug are normal bilaterally. Speech is well enunciated, no aphasia or dysarthria is noted. Extraocular movements are full. Visual fields are full. The tongue is midline, and the patient has symmetric elevation of the soft palate. No obvious hearing deficits are noted.  Motor: The motor testing reveals 5  over 5 strength of all 4 extremities. Good symmetric motor tone is noted throughout.  Sensory: Sensory testing is intact to pinprick, soft touch, vibration sensation, and position sense on all 4 extremities, with exception of decreased vibration sensation in both feet. No evidence of extinction is noted.  Coordination: Cerebellar testing reveals good finger-nose-finger and heel-to-shin bilaterally.  Gait and station: Gait is wide-based, slightly unsteady.  Tandem gait was not performed well by the patient, he appears to have some component of apraxia.  Romberg is negative but slightly unsteady.  Reflexes: Deep tendon reflexes are symmetric, but are depressed bilaterally. Toes are downgoing bilaterally.   Assessment/Plan:  1.  Reports of dizziness  2.  Probable anxiety disorder  3.  Gait disorder  The patient does appear to have a wide-based gait, he may have some component of peripheral neuropathy that is resulting in a sensation of imbalance with walking.  The patient also reports what sounds like an anxiety issue when around crowds of people.  We may in the future consider addition of propranolol, he has been noted to have elevated heart rate in the 90s to around 115.  We will check a thyroid studies today.  He will follow-up otherwise in about 4 months.  MRI of the brain will be done, nerve conduction studies will be done on both legs, and EMG on one leg.  Jill Alexanders MD 07/30/2018 11:48 AM  [facility] [address] [place], [place] [address]  Phone [phone] Fax [phone]

## 2018-07-31 ENCOUNTER — Telehealth: Payer: Self-pay | Admitting: Neurology

## 2018-07-31 NOTE — Telephone Encounter (Signed)
Spoke to the patient he is aware.  °

## 2018-07-31 NOTE — Telephone Encounter (Signed)
[employer] medicare order sent to [facility]. They will reach out to the pt to schedule.

## 2018-08-02 ENCOUNTER — Telehealth: Payer: Self-pay | Admitting: Neurology

## 2018-08-02 LAB — MULTIPLE MYELOMA PANEL, SERUM
ALPHA2 GLOB SERPL ELPH-MCNC: 1 g/dL (ref 0.4–1.0)
Albumin SerPl Elph-Mcnc: 3.3 g/dL (ref 2.9–4.4)
Albumin/Glob SerPl: 0.7 (ref 0.7–1.7)
Alpha 1: 0.3 g/dL (ref 0.0–0.4)
B-GLOBULIN SERPL ELPH-MCNC: 1.8 g/dL — AB (ref 0.7–1.3)
GAMMA GLOB SERPL ELPH-MCNC: 1.9 g/dL — AB (ref 0.4–1.8)
GLOBULIN, TOTAL: 5.1 g/dL — AB (ref 2.2–3.9)
IgA/Immunoglobulin A, Serum: 1044 mg/dL — ABNORMAL HIGH (ref 61–437)
IgG (Immunoglobin G), Serum: 2310 mg/dL — ABNORMAL HIGH (ref 700–1600)
IgM (Immunoglobulin M), Srm: 144 mg/dL — ABNORMAL HIGH (ref 15–143)
Total Protein: 8.4 g/dL (ref 6.0–8.5)

## 2018-08-02 LAB — ENA+DNA/DS+SJORGEN'S
ENA RNP AB: 1 AI — AB (ref 0.0–0.9)
ENA SM Ab Ser-aCnc: <0.2 AI (ref 0.0–0.9)
ENA SSA (RO) Ab: >8 AI — ABNORMAL HIGH (ref 0.0–0.9)
ENA SSB (LA) Ab: <0.2 AI (ref 0.0–0.9)
dsDNA Ab: 4 IU/mL (ref 0–9)

## 2018-08-02 LAB — TSH: TSH: 3.47 u[IU]/mL (ref 0.450–4.500)

## 2018-08-02 LAB — ANA W/REFLEX: Anti Nuclear Antibody(ANA): POSITIVE — AB

## 2018-08-02 LAB — VITAMIN B12: Vitamin B-12: 454 pg/mL (ref 232–1245)

## 2018-08-02 LAB — B. BURGDORFI ANTIBODIES

## 2018-08-02 LAB — SEDIMENTATION RATE: Sed Rate: 33 mm/hr — ABNORMAL HIGH (ref 0–30)

## 2018-08-02 LAB — RPR: RPR: NONREACTIVE

## 2018-08-02 NOTE — Telephone Encounter (Signed)
I called the patient.  The blood work was notable for a minimally elevated sedimentation rate, ANA was positive, on antibody panel there was a fairly significant elevation of the SSA antibody and a minimal elevation of the RNP antibody, the patient could potentially have Sjogren's syndrome.  EMG nerve conduction study will be done, Sjogren's syndrome can lead to a sensory neuropathy.

## 2018-08-16 ENCOUNTER — Other Ambulatory Visit: Payer: Self-pay

## 2018-08-21 ENCOUNTER — Ambulatory Visit
Admission: RE | Admit: 2018-08-21 | Discharge: 2018-08-21 | Disposition: A | Discharge status: Home / Self Care | Payer: Medicare HMO | Source: Ambulatory Visit | Attending: Neurology | Admitting: Neurology

## 2018-08-21 DIAGNOSIS — H814 Vertigo of central origin: Secondary | ICD-10-CM

## 2018-08-28 ENCOUNTER — Encounter: Payer: Self-pay | Admitting: Neurology

## 2018-08-28 ENCOUNTER — Telehealth: Payer: Self-pay | Admitting: Neurology

## 2018-08-28 NOTE — Telephone Encounter (Signed)
This patient did not show for an EMG and nerve conduction study appointment today. 

## 2018-08-29 ENCOUNTER — Encounter: Payer: Self-pay | Admitting: Neurology

## 2018-09-04 ENCOUNTER — Encounter: Payer: Self-pay | Admitting: Neurology

## 2018-09-04 ENCOUNTER — Ambulatory Visit (INDEPENDENT_AMBULATORY_CARE_PROVIDER_SITE_OTHER): Payer: Medicare HMO | Admitting: Neurology

## 2018-09-04 ENCOUNTER — Ambulatory Visit: Payer: Medicare HMO | Admitting: Neurology

## 2018-09-04 ENCOUNTER — Telehealth: Payer: Self-pay | Admitting: Neurology

## 2018-09-04 DIAGNOSIS — G609 Hereditary and idiopathic neuropathy, unspecified: Secondary | ICD-10-CM | POA: Diagnosis not present

## 2018-09-04 DIAGNOSIS — G629 Polyneuropathy, unspecified: Secondary | ICD-10-CM

## 2018-09-04 DIAGNOSIS — R42 Dizziness and giddiness: Secondary | ICD-10-CM

## 2018-09-04 DIAGNOSIS — R269 Unspecified abnormalities of gait and mobility: Secondary | ICD-10-CM

## 2018-09-04 DIAGNOSIS — G3281 Cerebellar ataxia in diseases classified elsewhere: Secondary | ICD-10-CM

## 2018-09-04 HISTORY — DX: Polyneuropathy, unspecified: G62.9

## 2018-09-04 HISTORY — DX: Unspecified abnormalities of gait and mobility: R26.9

## 2018-09-04 NOTE — Telephone Encounter (Signed)
[employer] medicare order sent to [facility]. They will obtain the auth and reach out to the pt to schedule.  °

## 2018-09-04 NOTE — Progress Notes (Addendum)
The patient comes in for EMG nerve conduction studies today.  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  Blood work is shown an elevated SSA antibody, slightly elevated sedimentation rate, he does report a history of dry eyes and dry mouth over the last year, he is using eyedrops regularly.  The patient could have Sjogren's syndrome.  Sjogren's syndrome may cause a peripheral neuropathy but it usually is a mainly sensory neuropathy which he does not have.  The patient was sent for MRI of the brain but he could not go through the study secondary to restless leg syndrome, he was moving too much.  I will get him set up for a CT scan of the brain.  If the patient desires physical therapy for his gait disorder, I will be happy to set this up.  The patient continues to have anxiety when he is out in public with a lot of people around him.      [place]    Nerve / Sites Muscle Latency Ref. Amplitude Ref. Rel Amp Segments Distance Velocity Ref. Area    ms ms mV mV %  cm m/s m/s mVms  R Ulnar - ADM     Wrist ADM 3.1 ?3.3 8.1 ?6.0 100 Wrist - ADM 7   25.2     B.Elbow ADM 7.4  6.7  82.5 B.Elbow - Wrist 23 53 ?49 24.7     A.Elbow ADM 9.4  6.1  90.6 A.Elbow - B.Elbow 10 51 ?49 24.4         A.Elbow - Wrist      R Peroneal - EDB     Ankle EDB 4.5 ?6.5 0.8 ?2.0 100 Ankle - EDB 9   2.5     Fib head EDB 13.4  0.5  60.2 Fib head - Ankle 32 36 ?44 1.4     Pop fossa EDB 16.3  0.4  93.3 Pop fossa - Fib head 10 35 ?44 1.3         Pop fossa - Ankle      L Peroneal - EDB     Ankle EDB 5.1 ?6.5 1.7 ?2.0 100 Ankle - EDB 9   4.1     Fib head EDB 13.4  1.4  82.5 Fib head - Ankle 32 39 ?44 3.7     Pop fossa EDB 16.0  1.2  87.4 Pop fossa - Fib head 10 38 ?44 3.5         Pop fossa - Ankle      R Tibial - AH     Ankle AH 2.5 ?5.8 0.8 ?4.0 100 Ankle - AH 9   3.0     Pop fossa AH NR  NR  NR Pop fossa - Ankle 42 NR ?41 NR  L Tibial - AH     Ankle AH 5.3 ?5.8 0.4 ?4.0 100  Ankle - AH 9   0.9     Pop fossa AH 15.4  0.3  81.2 Pop fossa - Ankle 41 41 ?41 0.9               SNC    Nerve / Sites Rec. Site Peak Lat Ref.  Amp Ref. Segments Distance    ms ms V V  cm  R Radial - Anatomical snuff box (Forearm)     Forearm Wrist 2.7 ?2.9 12 ?15 Forearm - Wrist 10  R Sural - Ankle (Calf)     Calf Ankle NR ?  4.4 NR ?6 Calf - Ankle 14  L Sural - Ankle (Calf)     Calf Ankle NR ?4.4 NR ?6 Calf - Ankle 14  R Superficial peroneal - Ankle     Lat leg Ankle NR ?4.4 NR ?6 Lat leg - Ankle 14  L Superficial peroneal - Ankle     Lat leg Ankle NR ?4.4 NR ?6 Lat leg - Ankle 14  R Ulnar - Orthodromic, (Dig V, Mid palm)     Dig V Wrist 3.8 ?3.1 3 ?5 Dig V - Wrist 34                  F  Wave    Nerve F Lat Ref.   ms ms  R Tibial - AH 78.8 ?56.0  L Tibial - AH 71.8 ?56.0  R Ulnar - ADM 33.2 ?32.0

## 2018-09-04 NOTE — Procedures (Signed)
     HISTORY:  Darryl Johns is an 84 year old gentleman with a history of a gait disorder.  The patient does report some numbness primarily in the left foot.  He is being evaluated for possible peripheral neuropathy.  He reports little discomfort in the feet, he is sleeping well at night.  NERVE CONDUCTION STUDIES:  The study of the right upper extremity was performed, this revealed a normal distal motor latency and motor amplitudes for the right ulnar nerve within normal nerve conduction velocity above and below the elbow.  The right ulnar sensory latency was prolonged.  The right radial sensory latency was normal.  The right ulnar F-wave latency was prolonged.  Nerve conduction studies were performed on both lower extremities.  The distal motor latencies for the peroneal and posterior tibial nerves were normal bilaterally with low motor amplitudes seen for these nerves bilaterally.  The nerve conduction velocities for the peroneal nerves bilaterally were slowed, no response was seen for the right posterior tibial nerve following popliteal fossa stimulation, the nerve conduction velocity for the left posterior tibial nerve was borderline normal.  The sural and peroneal sensory latencies were unobtainable bilaterally.  The F-wave latencies for the posterior tibial nerves were prolonged bilaterally.  EMG STUDIES:  EMG study was performed on the right lower extremity:  The tibialis anterior muscle reveals 2 to 4K motor units with decreased recruitment. No fibrillations or positive waves were seen. The peroneus tertius muscle reveals 2 to 5K motor units with decreased recruitment. No fibrillations or positive waves were seen. The medial gastrocnemius muscle reveals 1 to 3K motor units with slightly decreased recruitment. 2+ fibrillations and positive waves were seen. The vastus lateralis muscle reveals 2 to 4K motor units with full recruitment. No fibrillations or positive waves were seen. The  iliopsoas muscle reveals 2 to 4K motor units with full recruitment. No fibrillations or positive waves were seen. The biceps femoris muscle (long head) reveals 2 to 4K motor units with full recruitment. No fibrillations or positive waves were seen. The lumbosacral paraspinal muscles were tested at 3 levels, and revealed no abnormalities of insertional activity at all 3 levels tested. There was good relaxation.   IMPRESSION:  Nerve conduction studies done on the right upper extremity and both lower extremities shows evidence of a primarily axonal peripheral neuropathy of moderate severity.  EMG evaluation of the right lower extremity shows distal acute and chronic changes below the knee consistent with a diagnosis of peripheral neuropathy.  No clear evidence of an overlying lumbosacral radiculopathy was seen.  Jill Alexanders MD 09/04/2018 11:28 AM  [facility] [address] [place], [place] [address]  Phone [phone] Fax [phone]

## 2018-09-04 NOTE — Progress Notes (Signed)
Please refer to EMG and nerve conduction procedure note.  

## 2019-01-25 ENCOUNTER — Ambulatory Visit: Payer: Self-pay | Admitting: Neurology

## 2019-02-21 ENCOUNTER — Ambulatory Visit: Payer: Self-pay | Admitting: Neurology

## 2019-03-19 ENCOUNTER — Ambulatory Visit: Payer: Self-pay | Admitting: Surgery

## 2019-03-19 NOTE — H&P (Signed)
Darryl Johns Documented: 03/19/2019 11:57 AM Location: [facility] Patient #: [id number] DOB: June 25, 1935 Married / Language: English / Race: Black or African American Male  History of Present Illness Darryl Hector MD; 03/19/2019 1:04 PM) The patient is a 84 year old male who presents with a colonic mass. Note for "Colonic mass": ` ` ` Patient sent for surgical consultation at the request of Dr. Ronnald Ramp with [facility] medical group.  Chief Complaint: Upper abdominal mass with failure to thrive ` ` The patient is a thin elderly male who's had some decreased appetite and fatigue. Unintentional weight loss. Was concerned and followed up with his primary care office. Concern of a mass in his upper abdomen. CT scan and pelvis confirmed a 10 x 8 cm mass in his upper abdomen that seemed to be involved with his transverse colon suspicious for cancer. He comes today with his wife and sister. Patient himself is rather hard of hearing and tends to want to be differential to his family. Patient's sister notes there is a strong family history of colon polyps and cancer. She needed surgery. His twin brother died possibly from that as well although they don't know for certain. Patient used to smoke but has not done that and over a decade. Usually moves his bowels once a day. He's had some balance issues followed by Dr. Jannifer Franklin that a been mostly underwhelming. Usually walks with a cane. He has a walker today to sit down just for bowels purposes. Denies any exertional shortness of breath. He has never had any abdominal surgery. Spied strong family history of colorectal cancer, he himself has never had a colonoscopy. He is due to get one done next month (2 weeks from now). Patient denies any abdominal bloating. No nausea or vomiting. Usually moves his bowels once a day. He does take a Fiber supplement and his wife gives him. No worsening constipation. Energy level and appetite up and  down especially this last month. He's probably lost about 10 pounds over the past month. No major weight change in the past week since the family has been trying to encourage him to drink some supplemental shakes. Patient and family trying to process all this, but I get the sense that they are feeling overwhelmed and confused.  No personal nor family history of inflammatory bowel disease, irritable bowel syndrome, allergy such as Celiac Sprue, dietary/dairy problems, colitis, ulcers nor gastritis. No recent sick contacts/gastroenteritis. No travel outside the country. No changes in diet. No dysphagia to solids or liquids. No significant heartburn or reflux. No hematochezia, hematemesis, coffee ground emesis. No evidence of prior gastric/peptic ulceration.  (Review of systems as stated in this history (HPI) or in the review of systems. Otherwise all other 12 point ROS are negative) ` ` `   Allergies (Sabrina Canty, CMA; 03/19/2019 11:58 AM) No Known Allergies [03/19/2019]: No Known Drug Allergies [03/19/2019]: Allergies Reconciled  Medication History Nance Pew, CMA; 03/19/2019 11:58 AM) Losartan Potassium (25MG  Tablet, Oral) Active. Ondansetron HCl (8MG  Tablet, Oral) Active. Medications Reconciled    Vitals (Sabrina Canty CMA; 03/19/2019 11:59 AM) 03/19/2019 11:58 AM Weight: 130 lb Height: 74in Body Surface Area: 1.81 m Body Mass Index: 16.69 kg/m  Temp.: 97.62F(Temporal)  Pulse: 88 (Regular)  BP: 104/60 (Sitting, Left Arm, Standard)        Physical Exam Darryl Hector MD; 03/19/2019 12:58 PM)  General Mental Status-Alert. General Appearance-Not in acute distress, Not Sickly. Orientation-Oriented X3. Hydration-Well hydrated. Voice-Normal. Note: Thin male. Mildly cachectic. Able  to get up and stand. Moves slowly but steadily.  Integumentary Global Assessment Upon inspection and palpation of skin surfaces of the - Axillae:  non-tender, no inflammation or ulceration, no drainage. and Distribution of scalp and body hair is normal. General Characteristics Temperature - normal warmth is noted.  Head and Neck Head-normocephalic, atraumatic with no lesions or palpable masses. Face Global Assessment - atraumatic, no absence of expression. Neck Global Assessment - no abnormal movements, no bruit auscultated on the right, no bruit auscultated on the left, no decreased range of motion, non-tender. Trachea-midline. Thyroid Gland Characteristics - non-tender. 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  Eye Eyeball - Left-Extraocular movements intact, No Nystagmus - Left. Eyeball - Right-Extraocular movements intact, No Nystagmus - Right. Cornea - Left-No Hazy - Left. Cornea - Right-No Hazy - Right. Sclera/Conjunctiva - Left-No scleral icterus, No Discharge - Left. Sclera/Conjunctiva - Right-No scleral icterus, No Discharge - Right. Pupil - Left-Direct reaction to light normal. Pupil - Right-Direct reaction to light normal.  ENMT Ears Pinna - Left - no drainage observed, no generalized tenderness observed. Pinna - Right - no drainage observed, no generalized tenderness observed. Nose and Sinuses External Inspection of the Nose - no destructive lesion observed. Inspection of the nares - Left - quiet respiration. Inspection of the nares - Right - quiet respiration. Mouth and Throat Lips - Upper Lip - no fissures observed, no pallor noted. Lower Lip - no fissures observed, no pallor noted. Nasopharynx - no discharge present. Oral Cavity/Oropharynx - Tongue - no dryness observed. Oral Mucosa - no cyanosis observed. Hypopharynx - no evidence of airway distress observed.  Chest and Lung Exam Inspection Movements - Normal and Symmetrical. Accessory muscles - No use of accessory muscles in breathing. Palpation Palpation of the chest reveals - Non-tender. Auscultation Breath sounds - Normal and  Clear.  Cardiovascular Auscultation Rhythm - Regular. Murmurs & Other Heart Sounds - Auscultation of the heart reveals - No Murmurs and No Systolic Clicks.  Abdomen Inspection Inspection of the abdomen reveals - No Visible peristalsis and No Abnormal pulsations. Umbilicus - No Bleeding, No Urine drainage. Palpation/Percussion Palpation and Percussion of the abdomen reveal - Soft, Non Tender, No Rebound tenderness, No Rigidity (guarding) and No Cutaneous hyperesthesia. Note: Abdomen soft. Nontender. Very thin. Flat and not distended. Some vague fullness in the epigastric region suspicious for the abdominal mass of concern No umbilical or incisional hernias. No guarding.  Male Genitourinary Sexual Maturity Tanner 5 - Adult hair pattern and Adult penile size and shape. Note: No inguinal hernias or lymphadenopathy.  Peripheral Vascular Upper Extremity Inspection - Left - No Cyanotic nailbeds - Left, Not Ischemic. Inspection - Right - No Cyanotic nailbeds - Right, Not Ischemic.  Neurologic Neurologic evaluation reveals -normal attention span and ability to concentrate, able to name objects and repeat phrases. Appropriate fund of knowledge , normal sensation and normal coordination. Mental Status Affect - not angry, not paranoid. Cranial Nerves-Normal Bilaterally. Gait-Normal. Note: Mild swaying with eyes closed but no strong neurological deficits.  Neuropsychiatric Mental status exam performed with findings of-able to articulate well with normal speech/language, rate, volume and coherence, thought content normal with ability to perform basic computations and apply abstract reasoning and no evidence of hallucinations, delusions, obsessions or homicidal/suicidal ideation.  Musculoskeletal Global Assessment Spine, Ribs and Pelvis - no instability, subluxation or laxity. Right Upper Extremity - no instability, subluxation or laxity.  Lymphatic Head & Neck  General Head &  Neck Lymphatics: Bilateral - Description - No Localized  lymphadenopathy. Axillary  General Axillary Region: Bilateral - Description - No Localized lymphadenopathy. Femoral & Inguinal  Generalized Femoral & Inguinal Lymphatics: Left - Description - No Localized lymphadenopathy. Right - Description - No Localized lymphadenopathy.    Assessment & Plan Darryl Hector MD; 03/19/2019 12:52 PM)  MASS OF COLON (K63.89) Impression: Unintentional weight loss in fatigue with upper abdominal mass that seems to be within the transverse colon and a strong family history of colorectal cancer.  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 Standard of care would be segmental colonic resection. Reasonable to try and do a robotic resection wouldn't for anastomosis and Pfannenstiel incision extraction to avoid hernias and other issues. Family (sister and wife) is very concerned given his unintentional weight loss. He does not have any definite obstructive symptoms. He is already on some type of fiber supplement. I recommended that they take it twice a day.  He is due to see Dr. Virgel Bouquet with [facility] for a colonoscopy in the next couple of weeks to confirm the diagnosis. There were he to be in a month. Actually it'll be in less than 2 weeks. That is not unreasonable.  I think he would benefit from cardiac clearance. He saw Dr. Debara Pickett in December with an underwhelming echocardiogram and workup. Would like to double check Dr. Debara Pickett does not have any further concerns and okay to proceed with surgery.  Ideally patient needs complete: Workup with a CT of the chest and CEA blood level. Looks like to see a blood loss been ordered by the primary care physician but not the CT of the chest. That ideally needs to happen.  I strongly recommend he drink some supplemental shakes to keep his nutrition up. His wife notes he is starting to do that. Recommend he walk an hour a day to help get his energy stable.  His weight is lost has been unintentional but has been stable for the past week. There is no hard evidence of obstruction on his CAT scan, so hopefully have some time to complete the workup and plan probable resection. Should the colonoscopy be totally underwhelming, regrew.  We again asked that we get the actual CD pictures. Patient's wife & sister will try and reach out to them, but feels rather overwhelmed by all this. We will try and help move things along  Current Plans Pt Education - C[facility] Free Text Education/Instructions: discussed with patient and provided information.  PREOP COLON - ENCOUNTER FOR PREOPERATIVE EXAMINATION FOR GENERAL SURGICAL PROCEDURE (Z01.818)  Current Plans You are being scheduled for surgery- Our schedulers will call you.  You should hear from our office's scheduling department within 5 working days about the location, date, and time of surgery. We try to make accommodations for patient's preferences in scheduling surgery, but sometimes the OR schedule or the surgeon's schedule prevents [place] from making those accommodations.  If you have not heard from our office [phone]) in 5 working days, call the office and ask for your surgeon's nurse.  If you have other questions about your diagnosis, plan, or surgery, call the office and ask for your surgeon's nurse.  Written instructions provided The anatomy & physiology of the digestive tract was discussed. The pathophysiology of the colon was discussed. Natural history risks without surgery was discussed. I feel the risks of no intervention will lead to serious problems that outweigh the operative risks; therefore, I recommended a partial colectomy to remove the pathology. Minimally invasive (Robotic/Laparoscopic) & open techniques were  discussed.  Risks such as bleeding, infection, abscess, leak, reoperation, possible ostomy, hernia, heart attack, death, and other risks were discussed. I noted a good likelihood  this will help address the problem. Goals of post-operative recovery were discussed as well. Need for adequate nutrition, daily bowel regimen and healthy physical activity, to optimize recovery was noted as well. We will work to minimize complications. Educational materials were available as well. Questions were answered. The patient expresses understanding & wishes to proceed with surgery.  Pt Education - CCS Colon Bowel Prep 2018 ERAS/Miralax/Antibiotics Started Neomycin Sulfate 500 MG Oral Tablet, 2 (two) Tablet SEE NOTE, #6, 03/19/2019, No Refill. Local Order: Pharmacist Notes: TAKE TWO TABLETS AT 2 PM, 3 PM, AND 10 PM THE DAY PRIOR TO SURGERY Started Flagyl 500 MG Oral Tablet, 2 (two) Tablet SEE NOTE, #6, 03/19/2019, No Refill. Local Order: Pharmacist Notes: Take at 2pm, 3pm, and 10pm the day prior to your colon operation Pt Education - Pamphlet Given - Laparoscopic Colorectal Surgery: discussed with patient and provided information. Pt Education - CCS Colectomy post-op instructions: discussed with patient and provided information.  Darryl Hector, MD, FACS, MASCRS Gastrointestinal and Minimally Invasive Surgery    [address], [place], [place] [address] [phone] Main / Paging [phone] Fax

## 2019-03-28 ENCOUNTER — Other Ambulatory Visit: Payer: Self-pay

## 2019-03-28 ENCOUNTER — Emergency Department (HOSPITAL_COMMUNITY): Payer: Medicare HMO

## 2019-03-28 ENCOUNTER — Inpatient Hospital Stay (HOSPITAL_COMMUNITY)
Admission: EM | Admit: 2019-03-28 | Discharge: 2019-04-28 | DRG: 853 | Disposition: E | Payer: Medicare HMO | Attending: Pulmonary Disease | Admitting: Pulmonary Disease

## 2019-03-28 DIAGNOSIS — R198 Other specified symptoms and signs involving the digestive system and abdomen: Secondary | ICD-10-CM

## 2019-03-28 DIAGNOSIS — Z978 Presence of other specified devices: Secondary | ICD-10-CM

## 2019-03-28 DIAGNOSIS — A419 Sepsis, unspecified organism: Principal | ICD-10-CM | POA: Diagnosis present

## 2019-03-28 DIAGNOSIS — E870 Hyperosmolality and hypernatremia: Secondary | ICD-10-CM | POA: Diagnosis not present

## 2019-03-28 DIAGNOSIS — J432 Centrilobular emphysema: Secondary | ICD-10-CM | POA: Diagnosis present

## 2019-03-28 DIAGNOSIS — D62 Acute posthemorrhagic anemia: Secondary | ICD-10-CM | POA: Diagnosis not present

## 2019-03-28 DIAGNOSIS — I340 Nonrheumatic mitral (valve) insufficiency: Secondary | ICD-10-CM | POA: Diagnosis not present

## 2019-03-28 DIAGNOSIS — I2510 Atherosclerotic heart disease of native coronary artery without angina pectoris: Secondary | ICD-10-CM | POA: Diagnosis present

## 2019-03-28 DIAGNOSIS — I472 Ventricular tachycardia: Secondary | ICD-10-CM | POA: Diagnosis not present

## 2019-03-28 DIAGNOSIS — D638 Anemia in other chronic diseases classified elsewhere: Secondary | ICD-10-CM

## 2019-03-28 DIAGNOSIS — I519 Heart disease, unspecified: Secondary | ICD-10-CM

## 2019-03-28 DIAGNOSIS — J90 Pleural effusion, not elsewhere classified: Secondary | ICD-10-CM | POA: Diagnosis not present

## 2019-03-28 DIAGNOSIS — G629 Polyneuropathy, unspecified: Secondary | ICD-10-CM

## 2019-03-28 DIAGNOSIS — R34 Anuria and oliguria: Secondary | ICD-10-CM | POA: Diagnosis not present

## 2019-03-28 DIAGNOSIS — K921 Melena: Secondary | ICD-10-CM | POA: Diagnosis present

## 2019-03-28 DIAGNOSIS — E162 Hypoglycemia, unspecified: Secondary | ICD-10-CM | POA: Diagnosis not present

## 2019-03-28 DIAGNOSIS — J439 Emphysema, unspecified: Secondary | ICD-10-CM | POA: Diagnosis not present

## 2019-03-28 DIAGNOSIS — E873 Alkalosis: Secondary | ICD-10-CM | POA: Diagnosis not present

## 2019-03-28 DIAGNOSIS — J189 Pneumonia, unspecified organism: Secondary | ICD-10-CM | POA: Diagnosis not present

## 2019-03-28 DIAGNOSIS — R0603 Acute respiratory distress: Secondary | ICD-10-CM | POA: Diagnosis not present

## 2019-03-28 DIAGNOSIS — K922 Gastrointestinal hemorrhage, unspecified: Secondary | ICD-10-CM | POA: Diagnosis not present

## 2019-03-28 DIAGNOSIS — R4182 Altered mental status, unspecified: Secondary | ICD-10-CM

## 2019-03-28 DIAGNOSIS — I9589 Other hypotension: Secondary | ICD-10-CM | POA: Diagnosis present

## 2019-03-28 DIAGNOSIS — N179 Acute kidney failure, unspecified: Secondary | ICD-10-CM

## 2019-03-28 DIAGNOSIS — Z8371 Family history of colonic polyps: Secondary | ICD-10-CM

## 2019-03-28 DIAGNOSIS — R64 Cachexia: Secondary | ICD-10-CM | POA: Diagnosis present

## 2019-03-28 DIAGNOSIS — R6521 Severe sepsis with septic shock: Secondary | ICD-10-CM | POA: Diagnosis present

## 2019-03-28 DIAGNOSIS — Z515 Encounter for palliative care: Secondary | ICD-10-CM

## 2019-03-28 DIAGNOSIS — I361 Nonrheumatic tricuspid (valve) insufficiency: Secondary | ICD-10-CM | POA: Diagnosis not present

## 2019-03-28 DIAGNOSIS — R188 Other ascites: Secondary | ICD-10-CM | POA: Diagnosis present

## 2019-03-28 DIAGNOSIS — C184 Malignant neoplasm of transverse colon: Secondary | ICD-10-CM | POA: Insufficient documentation

## 2019-03-28 DIAGNOSIS — C772 Secondary and unspecified malignant neoplasm of intra-abdominal lymph nodes: Secondary | ICD-10-CM | POA: Diagnosis present

## 2019-03-28 DIAGNOSIS — E7220 Disorder of urea cycle metabolism, unspecified: Secondary | ICD-10-CM | POA: Diagnosis not present

## 2019-03-28 DIAGNOSIS — R578 Other shock: Secondary | ICD-10-CM | POA: Diagnosis not present

## 2019-03-28 DIAGNOSIS — D630 Anemia in neoplastic disease: Secondary | ICD-10-CM | POA: Diagnosis present

## 2019-03-28 DIAGNOSIS — I081 Rheumatic disorders of both mitral and tricuspid valves: Secondary | ICD-10-CM | POA: Diagnosis present

## 2019-03-28 DIAGNOSIS — E860 Dehydration: Secondary | ICD-10-CM

## 2019-03-28 DIAGNOSIS — K6389 Other specified diseases of intestine: Secondary | ICD-10-CM | POA: Diagnosis not present

## 2019-03-28 DIAGNOSIS — Z9911 Dependence on respirator [ventilator] status: Secondary | ICD-10-CM | POA: Diagnosis not present

## 2019-03-28 DIAGNOSIS — E43 Unspecified severe protein-calorie malnutrition: Secondary | ICD-10-CM | POA: Insufficient documentation

## 2019-03-28 DIAGNOSIS — D6489 Other specified anemias: Secondary | ICD-10-CM | POA: Diagnosis not present

## 2019-03-28 DIAGNOSIS — K660 Peritoneal adhesions (postprocedural) (postinfection): Secondary | ICD-10-CM | POA: Diagnosis present

## 2019-03-28 DIAGNOSIS — K562 Volvulus: Secondary | ICD-10-CM

## 2019-03-28 DIAGNOSIS — Z800 Family history of malignant neoplasm of digestive organs: Secondary | ICD-10-CM

## 2019-03-28 DIAGNOSIS — J9601 Acute respiratory failure with hypoxia: Secondary | ICD-10-CM

## 2019-03-28 DIAGNOSIS — Z801 Family history of malignant neoplasm of trachea, bronchus and lung: Secondary | ICD-10-CM

## 2019-03-28 DIAGNOSIS — J690 Pneumonitis due to inhalation of food and vomit: Secondary | ICD-10-CM | POA: Diagnosis not present

## 2019-03-28 DIAGNOSIS — D500 Iron deficiency anemia secondary to blood loss (chronic): Secondary | ICD-10-CM

## 2019-03-28 DIAGNOSIS — R911 Solitary pulmonary nodule: Secondary | ICD-10-CM | POA: Diagnosis present

## 2019-03-28 DIAGNOSIS — E872 Acidosis, unspecified: Secondary | ICD-10-CM

## 2019-03-28 DIAGNOSIS — Z932 Ileostomy status: Secondary | ICD-10-CM

## 2019-03-28 DIAGNOSIS — G6289 Other specified polyneuropathies: Secondary | ICD-10-CM | POA: Diagnosis present

## 2019-03-28 DIAGNOSIS — R1906 Epigastric swelling, mass or lump: Secondary | ICD-10-CM | POA: Diagnosis not present

## 2019-03-28 DIAGNOSIS — K920 Hematemesis: Secondary | ICD-10-CM | POA: Diagnosis present

## 2019-03-28 DIAGNOSIS — Z79899 Other long term (current) drug therapy: Secondary | ICD-10-CM

## 2019-03-28 DIAGNOSIS — L8990 Pressure ulcer of unspecified site, unspecified stage: Secondary | ICD-10-CM | POA: Insufficient documentation

## 2019-03-28 DIAGNOSIS — I700 Atherosclerosis of aorta: Secondary | ICD-10-CM | POA: Diagnosis present

## 2019-03-28 DIAGNOSIS — K55011 Focal (segmental) acute (reversible) ischemia of small intestine: Secondary | ICD-10-CM | POA: Diagnosis not present

## 2019-03-28 DIAGNOSIS — K55019 Acute (reversible) ischemia of small intestine, extent unspecified: Secondary | ICD-10-CM | POA: Diagnosis not present

## 2019-03-28 DIAGNOSIS — H9190 Unspecified hearing loss, unspecified ear: Secondary | ICD-10-CM

## 2019-03-28 DIAGNOSIS — Z20828 Contact with and (suspected) exposure to other viral communicable diseases: Secondary | ICD-10-CM | POA: Diagnosis present

## 2019-03-28 DIAGNOSIS — M3500 Sicca syndrome, unspecified: Secondary | ICD-10-CM | POA: Diagnosis present

## 2019-03-28 DIAGNOSIS — R627 Adult failure to thrive: Secondary | ICD-10-CM | POA: Diagnosis present

## 2019-03-28 DIAGNOSIS — R579 Shock, unspecified: Secondary | ICD-10-CM

## 2019-03-28 DIAGNOSIS — Z7951 Long term (current) use of inhaled steroids: Secondary | ICD-10-CM

## 2019-03-28 DIAGNOSIS — R970 Elevated carcinoembryonic antigen [CEA]: Secondary | ICD-10-CM

## 2019-03-28 DIAGNOSIS — D689 Coagulation defect, unspecified: Secondary | ICD-10-CM | POA: Diagnosis present

## 2019-03-28 DIAGNOSIS — R42 Dizziness and giddiness: Secondary | ICD-10-CM

## 2019-03-28 DIAGNOSIS — R634 Abnormal weight loss: Secondary | ICD-10-CM

## 2019-03-28 DIAGNOSIS — Z8041 Family history of malignant neoplasm of ovary: Secondary | ICD-10-CM

## 2019-03-28 DIAGNOSIS — R269 Unspecified abnormalities of gait and mobility: Secondary | ICD-10-CM

## 2019-03-28 DIAGNOSIS — I4891 Unspecified atrial fibrillation: Secondary | ICD-10-CM | POA: Diagnosis not present

## 2019-03-28 DIAGNOSIS — I4510 Unspecified right bundle-branch block: Secondary | ICD-10-CM | POA: Diagnosis present

## 2019-03-28 DIAGNOSIS — Y95 Nosocomial condition: Secondary | ICD-10-CM | POA: Diagnosis not present

## 2019-03-28 DIAGNOSIS — I10 Essential (primary) hypertension: Secondary | ICD-10-CM | POA: Diagnosis present

## 2019-03-28 DIAGNOSIS — J9690 Respiratory failure, unspecified, unspecified whether with hypoxia or hypercapnia: Secondary | ICD-10-CM

## 2019-03-28 DIAGNOSIS — I9581 Postprocedural hypotension: Secondary | ICD-10-CM | POA: Diagnosis not present

## 2019-03-28 DIAGNOSIS — I452 Bifascicular block: Secondary | ICD-10-CM | POA: Diagnosis present

## 2019-03-28 DIAGNOSIS — Z87891 Personal history of nicotine dependence: Secondary | ICD-10-CM

## 2019-03-28 DIAGNOSIS — K567 Ileus, unspecified: Secondary | ICD-10-CM

## 2019-03-28 DIAGNOSIS — J9600 Acute respiratory failure, unspecified whether with hypoxia or hypercapnia: Secondary | ICD-10-CM

## 2019-03-28 DIAGNOSIS — IMO0001 Reserved for inherently not codable concepts without codable children: Secondary | ICD-10-CM

## 2019-03-28 DIAGNOSIS — G9341 Metabolic encephalopathy: Secondary | ICD-10-CM | POA: Diagnosis not present

## 2019-03-28 DIAGNOSIS — Z681 Body mass index (BMI) 19 or less, adult: Secondary | ICD-10-CM

## 2019-03-28 DIAGNOSIS — Z66 Do not resuscitate: Secondary | ICD-10-CM | POA: Diagnosis not present

## 2019-03-28 DIAGNOSIS — E876 Hypokalemia: Secondary | ICD-10-CM | POA: Diagnosis not present

## 2019-03-28 DIAGNOSIS — F411 Generalized anxiety disorder: Secondary | ICD-10-CM

## 2019-03-28 DIAGNOSIS — J438 Other emphysema: Secondary | ICD-10-CM | POA: Diagnosis present

## 2019-03-28 DIAGNOSIS — I959 Hypotension, unspecified: Secondary | ICD-10-CM

## 2019-03-28 DIAGNOSIS — R918 Other nonspecific abnormal finding of lung field: Secondary | ICD-10-CM

## 2019-03-28 DIAGNOSIS — I5189 Other ill-defined heart diseases: Secondary | ICD-10-CM

## 2019-03-28 DIAGNOSIS — T17908A Unspecified foreign body in respiratory tract, part unspecified causing other injury, initial encounter: Secondary | ICD-10-CM

## 2019-03-28 DIAGNOSIS — K7689 Other specified diseases of liver: Secondary | ICD-10-CM | POA: Diagnosis present

## 2019-03-28 DIAGNOSIS — T884XXA Failed or difficult intubation, initial encounter: Secondary | ICD-10-CM

## 2019-03-28 HISTORY — DX: Other specified diseases of intestine: K63.89

## 2019-03-28 HISTORY — DX: Allergic rhinitis, unspecified: J30.9

## 2019-03-28 HISTORY — DX: Unspecified right bundle-branch block: I45.10

## 2019-03-28 LAB — POCT I-STAT EG7
Acid-base deficit: 22 mmol/L — ABNORMAL HIGH (ref 0.0–2.0)
Acid-base deficit: 8 mmol/L — ABNORMAL HIGH (ref 0.0–2.0)
Bicarbonate: 10.8 mmol/L — ABNORMAL LOW (ref 20.0–28.0)
Bicarbonate: 18.5 mmol/L — ABNORMAL LOW (ref 20.0–28.0)
Calcium, Ion: 1.25 mmol/L (ref 1.15–1.40)
Calcium, Ion: 1.21 mmol/L (ref 1.15–1.40)
HCT: 30 % — ABNORMAL LOW (ref 39.0–52.0)
HCT: 31 % — ABNORMAL LOW (ref 39.0–52.0)
Hemoglobin: 10.2 g/dL — ABNORMAL LOW (ref 13.0–17.0)
Hemoglobin: 10.5 g/dL — ABNORMAL LOW (ref 13.0–17.0)
O2 Saturation: 93 %
O2 Saturation: 53 %
Potassium: 3.8 mmol/L (ref 3.5–5.1)
Potassium: 3.7 mmol/L (ref 3.5–5.1)
Sodium: 133 mmol/L — ABNORMAL LOW (ref 135–145)
Sodium: 134 mmol/L — ABNORMAL LOW (ref 135–145)
TCO2: 13 mmol/L — ABNORMAL LOW (ref 22–32)
TCO2: 20 mmol/L — ABNORMAL LOW (ref 22–32)
pCO2, Ven: 59 mmHg (ref 44.0–60.0)
pCO2, Ven: 41.9 mmHg — ABNORMAL LOW (ref 44.0–60.0)
pH, Ven: 6.869 — CL (ref 7.250–7.430)
pH, Ven: 7.252 (ref 7.250–7.430)
pO2, Ven: 117 mmHg — ABNORMAL HIGH (ref 32.0–45.0)
pO2, Ven: 32 mmHg (ref 32.0–45.0)

## 2019-03-28 LAB — I-STAT CHEM 8, ED
BUN: 26 mg/dL — ABNORMAL HIGH (ref 8–23)
BUN: 25 mg/dL — ABNORMAL HIGH (ref 8–23)
Calcium, Ion: 1.24 mmol/L (ref 1.15–1.40)
Calcium, Ion: 1.18 mmol/L (ref 1.15–1.40)
Chloride: 101 mmol/L (ref 98–111)
Chloride: 99 mmol/L (ref 98–111)
Creatinine, Ser: 1.2 mg/dL (ref 0.61–1.24)
Creatinine, Ser: 1.1 mg/dL (ref 0.61–1.24)
Glucose, Bld: 280 mg/dL — ABNORMAL HIGH (ref 70–99)
Glucose, Bld: 165 mg/dL — ABNORMAL HIGH (ref 70–99)
HCT: 29 % — ABNORMAL LOW (ref 39.0–52.0)
HCT: 32 % — ABNORMAL LOW (ref 39.0–52.0)
Hemoglobin: 9.9 g/dL — ABNORMAL LOW (ref 13.0–17.0)
Hemoglobin: 10.9 g/dL — ABNORMAL LOW (ref 13.0–17.0)
Potassium: 3.8 mmol/L (ref 3.5–5.1)
Potassium: 3.6 mmol/L (ref 3.5–5.1)
Sodium: 133 mmol/L — ABNORMAL LOW (ref 135–145)
Sodium: 134 mmol/L — ABNORMAL LOW (ref 135–145)
TCO2: 13 mmol/L — ABNORMAL LOW (ref 22–32)
TCO2: 19 mmol/L — ABNORMAL LOW (ref 22–32)

## 2019-03-28 LAB — COMPREHENSIVE METABOLIC PANEL
ALT: <5 U/L (ref 0–44)
AST: 38 U/L (ref 15–41)
Albumin: 2.2 g/dL — ABNORMAL LOW (ref 3.5–5.0)
Alkaline Phosphatase: 77 U/L (ref 38–126)
Anion gap: 27 — ABNORMAL HIGH (ref 5–15)
BUN: 26 mg/dL — ABNORMAL HIGH (ref 8–23)
CO2: 10 mmol/L — ABNORMAL LOW (ref 22–32)
Calcium: 9.6 mg/dL (ref 8.9–10.3)
Chloride: 97 mmol/L — ABNORMAL LOW (ref 98–111)
Creatinine, Ser: 1.64 mg/dL — ABNORMAL HIGH (ref 0.61–1.24)
GFR calc Af Amer: 44 mL/min — ABNORMAL LOW (ref >60)
GFR calc non Af Amer: 38 mL/min — ABNORMAL LOW (ref >60)
Glucose, Bld: 301 mg/dL — ABNORMAL HIGH (ref 70–99)
Potassium: 3.9 mmol/L (ref 3.5–5.1)
Sodium: 134 mmol/L — ABNORMAL LOW (ref 135–145)
Total Bilirubin: 0.4 mg/dL (ref 0.3–1.2)
Total Protein: 7 g/dL (ref 6.5–8.1)

## 2019-03-28 LAB — CBC WITH DIFFERENTIAL/PLATELET
Abs Immature Granulocytes: 0.6 10*3/uL — ABNORMAL HIGH (ref 0.00–0.07)
Basophils Absolute: 0 10*3/uL (ref 0.0–0.1)
Basophils Relative: 0 %
Eosinophils Absolute: 0 10*3/uL (ref 0.0–0.5)
Eosinophils Relative: 0 %
HCT: 30.8 % — ABNORMAL LOW (ref 39.0–52.0)
Hemoglobin: 8.6 g/dL — ABNORMAL LOW (ref 13.0–17.0)
Immature Granulocytes: 5 %
Lymphocytes Relative: 18 %
Lymphs Abs: 2.2 10*3/uL (ref 0.7–4.0)
MCH: 24.6 pg — ABNORMAL LOW (ref 26.0–34.0)
MCHC: 27.9 g/dL — ABNORMAL LOW (ref 30.0–36.0)
MCV: 88.3 fL (ref 80.0–100.0)
Monocytes Absolute: 0.6 10*3/uL (ref 0.1–1.0)
Monocytes Relative: 5 %
Neutro Abs: 9 10*3/uL — ABNORMAL HIGH (ref 1.7–7.7)
Neutrophils Relative %: 72 %
Platelets: 485 10*3/uL — ABNORMAL HIGH (ref 150–400)
RBC: 3.49 MIL/uL — ABNORMAL LOW (ref 4.22–5.81)
RDW: 17.4 % — ABNORMAL HIGH (ref 11.5–15.5)
WBC: 12.4 10*3/uL — ABNORMAL HIGH (ref 4.0–10.5)
nRBC: 0 % (ref 0.0–0.2)

## 2019-03-28 LAB — APTT: aPTT: 32 seconds (ref 24–36)

## 2019-03-28 LAB — TROPONIN I (HIGH SENSITIVITY)
Troponin I (High Sensitivity): 13 ng/L (ref <18)
Troponin I (High Sensitivity): 40 ng/L — ABNORMAL HIGH (ref <18)

## 2019-03-28 LAB — PROTIME-INR
INR: 1.5 — ABNORMAL HIGH (ref 0.8–1.2)
Prothrombin Time: 17.7 seconds — ABNORMAL HIGH (ref 11.4–15.2)

## 2019-03-28 LAB — LACTIC ACID, PLASMA
Lactic Acid, Venous: >11 mmol/L (ref 0.5–1.9)
Lactic Acid, Venous: 7.8 mmol/L (ref 0.5–1.9)

## 2019-03-28 LAB — ETHANOL: Alcohol, Ethyl (B): <10 mg/dL (ref <10)

## 2019-03-28 LAB — TSH: TSH: 4.662 u[IU]/mL — ABNORMAL HIGH (ref 0.350–4.500)

## 2019-03-28 LAB — SARS CORONAVIRUS 2 BY RT PCR (HOSPITAL ORDER, PERFORMED IN ~~LOC~~ HOSPITAL LAB): SARS Coronavirus 2: NEGATIVE

## 2019-03-28 LAB — CBG MONITORING, ED: Glucose-Capillary: 268 mg/dL — ABNORMAL HIGH (ref 70–99)

## 2019-03-28 LAB — BRAIN NATRIURETIC PEPTIDE: B Natriuretic Peptide: 128.2 pg/mL — ABNORMAL HIGH (ref 0.0–100.0)

## 2019-03-28 LAB — POC OCCULT BLOOD, ED: Fecal Occult Bld: POSITIVE — AB

## 2019-03-28 LAB — AMMONIA: Ammonia: 244 umol/L — ABNORMAL HIGH (ref 9–35)

## 2019-03-28 LAB — ABO/RH: ABO/RH(D): B POS

## 2019-03-28 MED ORDER — METRONIDAZOLE IN NACL 5-0.79 MG/ML-% IV SOLN
500.0000 mg | Freq: Once | INTRAVENOUS | Status: AC
  Administered 2019-03-28: 18:00:00 500 mg via INTRAVENOUS
  Filled 2019-03-28: qty 100

## 2019-03-28 MED ORDER — VANCOMYCIN HCL 10 G IV SOLR
1500.0000 mg | Freq: Once | INTRAVENOUS | Status: AC
  Administered 2019-03-28: 20:00:00 1500 mg via INTRAVENOUS
  Filled 2019-03-28: qty 1500

## 2019-03-28 MED ORDER — LACTATED RINGERS IV BOLUS (SEPSIS)
250.0000 mL | Freq: Once | INTRAVENOUS | Status: AC
  Administered 2019-03-28: 21:00:00 250 mL via INTRAVENOUS

## 2019-03-28 MED ORDER — IOHEXOL 350 MG/ML SOLN
100.0000 mL | Freq: Once | INTRAVENOUS | Status: AC | PRN
  Administered 2019-03-28: 100 mL via INTRAVENOUS

## 2019-03-28 MED ORDER — VANCOMYCIN HCL 10 G IV SOLR
1250.0000 mg | INTRAVENOUS | Status: DC
  Filled 2019-03-28: qty 1250

## 2019-03-28 MED ORDER — SODIUM CHLORIDE 0.9 % IV SOLN
2.0000 g | Freq: Two times a day (BID) | INTRAVENOUS | Status: DC
  Administered 2019-03-29 – 2019-04-03 (×11): 2 g via INTRAVENOUS
  Filled 2019-03-28 (×11): qty 2

## 2019-03-28 MED ORDER — ACETAMINOPHEN 650 MG RE SUPP: 650.0000 mg | Freq: Four times a day (QID) | RECTAL | Status: DC | PRN

## 2019-03-28 MED ORDER — ACETAMINOPHEN 325 MG PO TABS: 650.0000 mg | ORAL_TABLET | Freq: Four times a day (QID) | ORAL | Status: DC | PRN

## 2019-03-28 MED ORDER — ONDANSETRON HCL 4 MG PO TABS: 4.0000 mg | ORAL_TABLET | Freq: Four times a day (QID) | ORAL | Status: DC | PRN

## 2019-03-28 MED ORDER — LACTATED RINGERS IV BOLUS (SEPSIS)
1000.0000 mL | Freq: Once | INTRAVENOUS | Status: AC
  Administered 2019-03-28: 1000 mL via INTRAVENOUS

## 2019-03-28 MED ORDER — LACTATED RINGERS IV BOLUS
1000.0000 mL | Freq: Once | INTRAVENOUS | Status: AC
  Administered 2019-03-28: 17:00:00 1000 mL via INTRAVENOUS

## 2019-03-28 MED ORDER — VANCOMYCIN HCL IN DEXTROSE 1-5 GM/200ML-% IV SOLN: 1000.0000 mg | Freq: Once | INTRAVENOUS | Status: DC

## 2019-03-28 MED ORDER — ONDANSETRON HCL 4 MG/2ML IJ SOLN
4.0000 mg | Freq: Four times a day (QID) | INTRAMUSCULAR | Status: DC | PRN
  Administered 2019-03-29: 4 mg via INTRAVENOUS
  Filled 2019-03-28: qty 2

## 2019-03-28 MED ORDER — SODIUM CHLORIDE 0.9 % IV SOLN
2.0000 g | Freq: Once | INTRAVENOUS | Status: AC
  Administered 2019-03-28: 2 g via INTRAVENOUS
  Filled 2019-03-28: qty 2

## 2019-03-28 NOTE — Progress Notes (Addendum)
Pharmacy Antibiotic Note  Darryl Johns is a 84 y.o. male admitted on 04/19/2019 with sepsis after arriving to the ED with AMS s/p fall.  Pharmacy has been consulted for vancomycin and cefepime dosing.  Pt's calculated CrCl is ~ 50 mL/min. WBC is 12.4. Pt's lactate is > 11 with a pH of 6.87. He is also hypothermic and hypotensive.  Plan: Vancomycin 1500 mg IV X 1 as loading dose Vancomycin 1250 mg IV Q 24 hrs as maintenance dose. Goal AUC 400-550. Expected AUC:514 SCr used: 1.2 Cefepime 2 g IV Q 12 hous Metronidazole 500 x 1 Follow cultures, renal function, clinical status, WBC, and Temp    Temp (24hrs), Avg:94.8 F (34.9 C), Min:94.8 F (34.9 C), Max:94.8 F (34.9 C)  Recent Labs  Lab 04/06/2019 1542 04/14/2019 1557  WBC 12.4*  --   CREATININE  --  1.20    CrCl cannot be calculated (Unknown ideal weight.).    No Known Allergies  Antimicrobials this admission: Cefepime 10/01>> Vancomycin 10/01>> Flagyl 10/01>>  Microbiology results: 10/01 UCx: Sent 10/01 COVID: Sent  Thank you for allowing pharmacy to be a part of this patient's care.  Sherren Kerns, PharmD PGY1 Acute Care Pharmacy Resident 04/15/2019 4:29 PM

## 2019-03-28 NOTE — ED Provider Notes (Signed)
[facility] EMERGENCY DEPARTMENT Provider Note   CSN: [id number] Arrival date & time: 03/31/2019  1538     History   Chief Complaint Chief Complaint  Patient presents with   Altered Mental Status    HPI Darryl Johns is a 84 y.o. male.     HPI  The patient is an 84 year old male with a history of colon cancer (plans for robotic proximal colectomy on Apr 20, 2019) and HTN who presents to the ED with altered mental status.  Per EMS, the patient was found down at home after an unwitnessed fall. Wife arrived bedside after arrival and mentioned that the patient is scheduled for a colonoscopy on 04/02/2019 and robotic proximal colectomy on 2019/04/20. She states that the patient has had diarrhea for the past few days.   On arrival, the patient was hypotensive and tachycardic. He was AAOx3 and GCS 14. Code sepsis was initiated. The patient had initially been GCS of 5 with low RR and BP 60/40 with EMS but improved some following IVF administration (400cc with EMS).   Following IVF resuscitation, the patient was more alert and able to answer more HPI questions. He fell in his bathroom, did not hit his head but did think he lost consciousness. Total time down possibly 10-15 minutes. Family found him and called EMS and he was transported to [facility] for further management.  Past Medical History:  Diagnosis Date   Gait abnormality 09/04/2018   Hypertension    Peripheral neuropathy 09/04/2018    Patient Active Problem List   Diagnosis Date Noted   Sepsis ([place]) 04/05/2019   Peripheral neuropathy 09/04/2018   Gait abnormality 09/04/2018   Orthostatic hypotension 05/17/2018   Essential hypertension 12/23/2014   RBBB 12/23/2014   Dizziness 12/23/2014    No past surgical history on file.      Home Medications    Prior to Admission medications   Medication Sig Start Date End Date Taking? Authorizing Provider  acetaminophen (TYLENOL) 500 MG tablet Take 500 mg by  mouth every 6 (six) hours as needed for mild pain.    Yes [provider]  ARTIFICIAL TEAR OP Place 1 drop into both eyes daily.    Yes [provider]  fluticasone (FLONASE) 50 MCG/ACT nasal spray Place 1 spray into both nostrils daily as needed for allergies.    Yes [provider]  Ibuprofen (ADVIL) 200 MG CAPS Take 1 capsule by mouth daily as needed (for pain).    Yes [provider]  losartan (COZAAR) 50 MG tablet Take 1 tablet (50 mg total) by mouth daily. 05/17/18  Yes Hilty, Nadean Corwin, MD    Family History Family History  Problem Relation Age of Onset   Stroke Mother    Lung cancer Brother    Ovarian cancer Sister    Cancer Brother     Social History Social History   Tobacco Use   Smoking status: Current Every Day Smoker   Smokeless tobacco: Never Used  Substance Use Topics   Alcohol use: Yes   Drug use: No     Allergies   Patient has no known allergies.   Review of Systems Review of Systems  Unable to perform ROS: Mental status change     Physical Exam Updated Vital Signs BP (!) 87/55    Pulse (!) 113    Temp 99.5 F (37.5 C) (Rectal)    Resp (!) 26    Ht 6\' 2"  (1.88 m)    Wt  70 kg    SpO2 100%    BMI 19.81 kg/m   Physical Exam Vitals signs and nursing note reviewed.  Constitutional:      Appearance: He is well-developed.     Comments: Alert but somewhat confused, AAOx3, GCS14.   HENT:     Head: Normocephalic and atraumatic.  Eyes:     Conjunctiva/sclera: Conjunctivae normal.  Neck:     Musculoskeletal: Neck supple.  Cardiovascular:     Rate and Rhythm: Regular rhythm. Tachycardia present.     Heart sounds: No murmur.  Pulmonary:     Effort: Tachypnea present. No respiratory distress.     Breath sounds: Examination of the right-lower field reveals decreased breath sounds. Examination of the left-lower field reveals decreased breath sounds. Decreased breath sounds present.  Abdominal:     Palpations:  Abdomen is soft.     Tenderness: There is no abdominal tenderness.  Skin:    General: Skin is warm and dry.  Neurological:     Mental Status: He is alert. He is confused.     GCS: GCS eye subscore is 4. GCS verbal subscore is 4. GCS motor subscore is 6.     Cranial Nerves: Cranial nerves are intact.     Sensory: Sensation is intact.     Motor: Motor function is intact.      ED Treatments / Results  Labs (all labs ordered are listed, but only abnormal results are displayed) Labs Reviewed  CBC WITH DIFFERENTIAL/PLATELET - Abnormal; Notable for the following components:      Result Value   WBC 12.4 (*)    RBC 3.49 (*)    Hemoglobin 8.6 (*)    HCT 30.8 (*)    MCH 24.6 (*)    MCHC 27.9 (*)    RDW 17.4 (*)    Platelets 485 (*)    Neutro Abs 9.0 (*)    Abs Immature Granulocytes 0.60 (*)    All other components within normal limits  COMPREHENSIVE METABOLIC PANEL - Abnormal; Notable for the following components:   Sodium 134 (*)    Chloride 97 (*)    CO2 10 (*)    Glucose, Bld 301 (*)    BUN 26 (*)    Creatinine, Ser 1.64 (*)    Albumin 2.2 (*)    GFR calc non Af Amer 38 (*)    GFR calc Af Amer 44 (*)    Anion gap 27 (*)    All other components within normal limits  LACTIC ACID, PLASMA - Abnormal; Notable for the following components:   Lactic Acid, Venous >11.0 (*)    All other components within normal limits  LACTIC ACID, PLASMA - Abnormal; Notable for the following components:   Lactic Acid, Venous 7.8 (*)    All other components within normal limits  AMMONIA - Abnormal; Notable for the following components:   Ammonia 244 (*)    All other components within normal limits  TSH - Abnormal; Notable for the following components:   TSH 4.662 (*)    All other components within normal limits  BRAIN NATRIURETIC PEPTIDE - Abnormal; Notable for the following components:   B Natriuretic Peptide 128.2 (*)    All other components within normal limits  PROTIME-INR - Abnormal;  Notable for the following components:   Prothrombin Time 17.7 (*)    INR 1.5 (*)    All other components within normal limits  URINALYSIS, ROUTINE W REFLEX MICROSCOPIC - Abnormal; Notable for the following  components:   APPearance HAZY (*)    Specific Gravity, Urine 1.032 (*)    Hgb urine dipstick SMALL (*)    Protein, ur 100 (*)    All other components within normal limits  BASIC METABOLIC PANEL - Abnormal; Notable for the following components:   CO2 19 (*)    Glucose, Bld 124 (*)    BUN 34 (*)    Creatinine, Ser 1.43 (*)    Calcium 8.5 (*)    GFR calc non Af Amer 45 (*)    GFR calc Af Amer 52 (*)    All other components within normal limits  CBG MONITORING, ED - Abnormal; Notable for the following components:   Glucose-Capillary 268 (*)    All other components within normal limits  I-STAT CHEM 8, ED - Abnormal; Notable for the following components:   Sodium 133 (*)    BUN 26 (*)    Glucose, Bld 280 (*)    TCO2 13 (*)    Hemoglobin 9.9 (*)    HCT 29.0 (*)    All other components within normal limits  POCT I-STAT EG7 - Abnormal; Notable for the following components:   pH, Ven 6.869 (*)    pO2, Ven 117.0 (*)    Bicarbonate 10.8 (*)    TCO2 13 (*)    Acid-base deficit 22.0 (*)    Sodium 133 (*)    HCT 30.0 (*)    Hemoglobin 10.2 (*)    All other components within normal limits  POC OCCULT BLOOD, ED - Abnormal; Notable for the following components:   Fecal Occult Bld POSITIVE (*)    All other components within normal limits  POCT I-STAT EG7 - Abnormal; Notable for the following components:   pCO2, Ven 41.9 (*)    Bicarbonate 18.5 (*)    TCO2 20 (*)    Acid-base deficit 8.0 (*)    Sodium 134 (*)    HCT 31.0 (*)    Hemoglobin 10.5 (*)    All other components within normal limits  I-STAT CHEM 8, ED - Abnormal; Notable for the following components:   Sodium 134 (*)    BUN 25 (*)    Glucose, Bld 165 (*)    TCO2 19 (*)    Hemoglobin 10.9 (*)    HCT 32.0 (*)    All  other components within normal limits  TROPONIN I (HIGH SENSITIVITY) - Abnormal; Notable for the following components:   Troponin I (High Sensitivity) 40 (*)    All other components within normal limits  SARS CORONAVIRUS 2 (HOSPITAL ORDER, [facility])  URINE CULTURE  CULTURE, BLOOD (ROUTINE X 2)  CULTURE, BLOOD (ROUTINE X 2)  EXPECTORATED SPUTUM ASSESSMENT W REFEX TO RESP CULTURE  APTT  ETHANOL  OCCULT BLOOD X 1 CARD TO LAB, STOOL  CBC  COMPREHENSIVE METABOLIC PANEL  HEMOGLOBIN A1C  I-STAT VENOUS BLOOD GAS, ED  I-STAT VENOUS BLOOD GAS, ED  TYPE AND SCREEN  ABO/RH  TROPONIN I (HIGH SENSITIVITY)    EKG EKG Interpretation  Date/Time:  Thursday March 28 2019 15:42:49 EDT Ventricular Rate:  114 PR Interval:    QRS Duration: 117 QT Interval:  351 QTC Calculation: 484 R Axis:   -111 Text Interpretation:  Sinus tachycardia RBBB and LAFB Borderline low voltage, extremity leads ST abnormalities, likely rate related No significant change since last tracing Confirmed by Gareth Morgan [phone]) on 03/30/2019 4:11:42 PM   Radiology Ct Head Wo Contrast  Result  Date: 04/24/2019 CLINICAL DATA:  Trauma.  Altered mental status.  Fall. EXAM: CT HEAD WITHOUT CONTRAST CT CERVICAL SPINE WITHOUT CONTRAST TECHNIQUE: Multidetector CT imaging of the head and cervical spine was performed following the standard protocol without intravenous contrast. Multiplanar CT image reconstructions of the cervical spine were also generated. COMPARISON:  None. FINDINGS: CT HEAD FINDINGS Brain: No evidence of acute infarction, hemorrhage, hydrocephalus, extra-axial collection or mass lesion/mass effect. There is ventricular and sulcal enlargement reflecting mild diffuse atrophy. White matter hypoattenuation is noted bilaterally consistent with advanced chronic microvascular ischemic change. 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 There is a focus of calcification in the right pons.  Vascular: No hyperdense vessel or unexpected calcification. Skull: Normal. Negative for fracture or focal lesion. Sinuses/Orbits: Globes and orbits are unremarkable. Sinuses and mastoid air cells are clear. Other: None. CT CERVICAL SPINE FINDINGS Alignment: Normal. Skull base and vertebrae: No acute fracture. No primary bone lesion or focal pathologic process. Soft tissues and spinal canal: No prevertebral fluid or swelling. No visible canal hematoma. Oval low-attenuation soft tissue mass in the subcutaneous soft tissues of the left posterior neck measuring 2.4 cm, consistent with a sebaceous or inclusion cyst. Disc levels: Moderate loss of disc height at C4-C5 and C6-C7 with spondylotic disc bulging and endplate spurring. Facet degenerative changes are noted bilaterally, greatest on the right at C5-C6. No evidence of a disc herniation. Upper chest: No acute findings. Other: None. IMPRESSION: HEAD CT 1. No acute intracranial abnormalities. 2. No skull fracture. 3. Moderate chronic microvascular ischemic change. CERVICAL CT 1. No fracture or acute finding. Electronically Signed   By: Lajean Manes M.D.   On: 04/06/2019 18:18   Ct Angio Chest Pe W And/or Wo Contrast  Result Date: 04/14/2019 CLINICAL DATA:  84 year old male with clinical suspicion of pulmonary embolism. High pretest probability. EXAM: CT ANGIOGRAPHY CHEST WITH CONTRAST TECHNIQUE: Multidetector CT imaging of the chest was performed using the standard protocol during bolus administration of intravenous contrast. Multiplanar CT image reconstructions and MIPs were obtained to evaluate the vascular anatomy. CONTRAST:  115mL OMNIPAQUE IOHEXOL 350 MG/ML SOLN COMPARISON:  No priors. FINDINGS: Cardiovascular: No filling defects within the pulmonary arterial tree to suggest underlying pulmonary embolism. Heart size is normal. There is no significant pericardial fluid, thickening or pericardial calcification. There is aortic atherosclerosis, as well as  atherosclerosis of the great vessels of the mediastinum and the coronary arteries, including calcified atherosclerotic plaque in the left main, left anterior descending, left circumflex and right coronary arteries. Mediastinum/Nodes: No pathologically enlarged mediastinal or hilar lymph nodes. Patulous esophagus which is partially fluid filled. No axillary lymphadenopathy. Lungs/Pleura: Patchy multifocal airspace consolidation in the right lower lobe dependently, potentially sequela of aspiration. In the medial aspect of the left upper lobe (axial image 37 of series 7) there is a well-circumscribed pulmonary nodule measuring 1.2 x 1.4 cm. No other definite suspicious appearing pulmonary nodules or masses are noted. Mild diffuse bronchial wall thickening with mild centrilobular and paraseptal emphysema. No pleural effusions. Upper Abdomen: Aortic atherosclerosis. Pneumobilia, presumably from prior sphincterotomy. Small volume of ascites noted adjacent to the liver and spleen. Aortic atherosclerosis. Musculoskeletal: There are no aggressive appearing lytic or blastic lesions noted in the visualized portions of the skeleton. Review of the MIP images confirms the above findings. IMPRESSION: 1. No evidence of pulmonary embolism. 2. Dependent areas of airspace consolidation in the right lower lobe, concerning for sequela of aspiration. 3. 1.2 x 1.4 cm well-circumscribed nodule in the left upper  lobe. Consider one of the following in 3 months for both low-risk and high-risk individuals: (a) repeat chest CT or (b) follow-up PET-CT. This recommendation follows the consensus statement: Guidelines for Management of Incidental Pulmonary Nodules Detected on CT Images: From the Fleischner Society 2017; Radiology 2017; 284:228-243. 4. Mild diffuse bronchial wall thickening with mild centrilobular and paraseptal emphysema; imaging findings suggestive of underlying COPD. 5. Aortic atherosclerosis, in addition to left main and 3  vessel coronary artery disease. 6. Small volume of ascites. Aortic Atherosclerosis (ICD10-I70.0) and Emphysema (ICD10-J43.9). Electronically Signed   By: Vinnie Langton M.D.   On: 04/01/2019 18:21   Ct Cervical Spine Wo Contrast  Result Date: 04/08/2019 CLINICAL DATA:  Trauma.  Altered mental status.  Fall. EXAM: CT HEAD WITHOUT CONTRAST CT CERVICAL SPINE WITHOUT CONTRAST TECHNIQUE: Multidetector CT imaging of the head and cervical spine was performed following the standard protocol without intravenous contrast. Multiplanar CT image reconstructions of the cervical spine were also generated. COMPARISON:  None. FINDINGS: CT HEAD FINDINGS Brain: No evidence of acute infarction, hemorrhage, hydrocephalus, extra-axial collection or mass lesion/mass effect. There is ventricular and sulcal enlargement reflecting mild diffuse atrophy. White matter hypoattenuation is noted bilaterally consistent with advanced chronic microvascular ischemic change. 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 There is a focus of calcification in the right pons. Vascular: No hyperdense vessel or unexpected calcification. Skull: Normal. Negative for fracture or focal lesion. Sinuses/Orbits: Globes and orbits are unremarkable. Sinuses and mastoid air cells are clear. Other: None. CT CERVICAL SPINE FINDINGS Alignment: Normal. Skull base and vertebrae: No acute fracture. No primary bone lesion or focal pathologic process. Soft tissues and spinal canal: No prevertebral fluid or swelling. No visible canal hematoma. Oval low-attenuation soft tissue mass in the subcutaneous soft tissues of the left posterior neck measuring 2.4 cm, consistent with a sebaceous or inclusion cyst. Disc levels: Moderate loss of disc height at C4-C5 and C6-C7 with spondylotic disc bulging and endplate spurring. Facet degenerative changes are noted bilaterally, greatest on the right at C5-C6. No evidence of a disc herniation. Upper chest: No acute findings. Other:  None. IMPRESSION: HEAD CT 1. No acute intracranial abnormalities. 2. No skull fracture. 3. Moderate chronic microvascular ischemic change. CERVICAL CT 1. No fracture or acute finding. Electronically Signed   By: Lajean Manes M.D.   On: 04/09/2019 18:18   Dg Chest Portable 1 View  Result Date: 04/17/2019 CLINICAL DATA:  Shortness of breath EXAM: PORTABLE CHEST 1 VIEW COMPARISON:  08/25/2009 FINDINGS: Heart is normal size. Low volumes. Bibasilar opacities likely reflect atelectasis. Aortic atherosclerosis. No visible effusions or acute bony abnormality. IMPRESSION: Low lung volumes with bibasilar atelectasis. Electronically Signed   By: Rolm Baptise M.D.   On: 04/16/2019 16:05    Procedures Procedures (including critical care time)  Medications Ordered in ED Medications  vancomycin (VANCOCIN) 1,250 mg in sodium chloride 0.9 % 250 mL IVPB (has no administration in time range)  ceFEPIme (MAXIPIME) 2 g in sodium chloride 0.9 % 100 mL IVPB (0 g Intravenous Stopped 04/27/2019 0557)  acetaminophen (TYLENOL) tablet 650 mg (has no administration in time range)    Or  acetaminophen (TYLENOL) suppository 650 mg (has no administration in time range)  ondansetron (ZOFRAN) tablet 4 mg ( Oral See Alternative 04/24/2019 [id number])    Or  ondansetron (ZOFRAN) injection 4 mg (4 mg Intravenous Given 04/21/2019 0635)  pantoprazole (PROTONIX) injection 40 mg (has no administration in time range)  lactated ringers infusion ( Intravenous New Bag/Given 04/27/2019  0123)  acetaminophen (TYLENOL) tablet 1,000 mg (has no administration in time range)  alvimopan (ENTEREG) capsule 12 mg (has no administration in time range)  bisacodyl (DULCOLAX) EC tablet 20 mg (has no administration in time range)  bupivacaine liposome (EXPAREL) 1.3 % injection 266 mg (has no administration in time range)  cefoTEtan (CEFOTAN) 2 g in sodium chloride 0.9 % 100 mL IVPB (has no administration in time range)  clindamycin (CLEOCIN) 900 mg, gentamicin  (GARAMYCIN) 240 mg in sodium chloride 0.9 % 1,000 mL for intraperitoneal lavage (has no administration in time range)  gabapentin (NEURONTIN) capsule 300 mg (has no administration in time range)  neomycin (MYCIFRADIN) tablet 1,000 mg (has no administration in time range)    And  metroNIDAZOLE (FLAGYL) tablet 1,000 mg (has no administration in time range)  polyethylene glycol powder (GLYCOLAX/MIRALAX) container 255 g (has no administration in time range)  enoxaparin (LOVENOX) injection 40 mg (has no administration in time range)  lactated ringers bolus 1,000 mL (0 mLs Intravenous Stopped 04/07/2019 1804)    And  lactated ringers bolus 250 mL (0 mLs Intravenous Stopped 04/25/2019 2128)  ceFEPIme (MAXIPIME) 2 g in sodium chloride 0.9 % 100 mL IVPB (0 g Intravenous Stopped 04/21/2019 1804)  metroNIDAZOLE (FLAGYL) IVPB 500 mg (0 mg Intravenous Stopped 04/05/2019 1928)  vancomycin (VANCOCIN) 1,500 mg in sodium chloride 0.9 % 500 mL IVPB (0 mg Intravenous Stopped 04/09/2019 2157)  lactated ringers bolus 1,000 mL (0 mLs Intravenous Stopped 04/19/2019 1917)  iohexol (OMNIPAQUE) 350 MG/ML injection 100 mL (100 mLs Intravenous Contrast Given 04/19/2019 1758)     Initial Impression / Assessment and Plan / ED Course  I have reviewed the triage vital signs and the nursing notes.  Pertinent labs & imaging results that were available during my care of the patient were reviewed by me and considered in my medical decision making (see chart for details).  Clinical Course as of Mar 28 718  Thu Mar 28, 2019  1909 Ammonia(!): 244 [JL]  1909 Lactic Acid, Venous(!!): >11.0 [JL]  1909 Creatinine(!): 1.64 [JL]  1909 Anion gap(!): 27 [JL]  1909 CO2(!): 10 [JL]  1909 pH, Ven(!!): 6.869 [JL]  1909 Bicarbonate(!): 10.8 [JL]  1909 SARS Coronavirus 2: NEGATIVE [JL]  1910 BP: 120/70 [JL]  2159 Lactic Acid, Venous(!!): 7.8 [JL]    Clinical Course User Index [JL] Regan Lemming, MD       On arrival, the patient was mildly  hypothermic (T 96.48F rectal), AAOx3, confused, GCS14, BP 97/54, P 118. CODE SEPSIS initiated. Initial labs drawn to include BCx2, VBG, Type and Screen, CBC, CMP, Troponin, INR, Lactic acid, Ammonia, TSH, EtOH, COVID swab, POC occult blood. Urine studies ordered.   The patient was administered ABX following BC collection to include Vancomycin, Cefepime, and Flagyl given presumed sepsis of unknown source. He was administered 30cc/kg LR.  Initial imaging of CTH and C-spine negative for acute abnormality. CXR with low lung volumes and bibasilar atelectasis. CTA Chest without PE but concerning for RLL airspace consolidation concerning for sequelae of aspiration.  Initial labs concerning for a pH of 6.87, lactic acid of >11 (7.8 following IVF on recheck), BG 268, WBC 12.4, CO2 10, Cr 1.64, Trop 13->40, Ammonia 244, TSH 4.66. COVID swab resulted negative. Fecal occult blood resulted positive. A repeat VBG following 30cc/kg LR boluses revealed improvement at 7.25, pCO2 42, bicarb 18.5, Hgb 10.5. The patient's mental status also improved following IVF administration. Urine studies still pending. Source of possible sepsis at this time presumed  respiratory from aspiration pneumonia. Additional consideration given to possible hypotension from hypovolemia due to diarrhea for the past few days. Further consideration given to possible GI bleed in the setting of colon cancer with a positive FOBT and an anemia on labs. Internal Medicine consulted for admission and further management.   Final Clinical Impressions(s) / ED Diagnoses   Final diagnoses:  Altered mental status, unspecified altered mental status type  Dehydration  Hyperammonemia (HCC)  Lactic acidosis  Hypotension, unspecified hypotension type    ED Discharge Orders    None       Regan Lemming, MD 04/11/2019 [id number]    Gareth Morgan, MD 03/30/2019 2236

## 2019-03-28 NOTE — Progress Notes (Signed)
Secure message sent to provider to request order for blood cultures to be drawn per Code Sepsis.

## 2019-03-28 NOTE — ED Notes (Signed)
Wife at bedside-- states scheduled for colonoscopy 04/02/2019-- and surgery this month for colon cancer

## 2019-03-28 NOTE — ED Triage Notes (Signed)
Pt arrived via ems; c/o altered mental status after a fall.

## 2019-03-28 NOTE — ED Notes (Signed)
Attempted to In and out cath patient, Unsuccessful.

## 2019-03-28 NOTE — ED Notes (Signed)
I-Stat Venous blood gas abnormal results (PO2 of 32) reported to Dr. Billy Fischer.

## 2019-03-28 NOTE — H&P (Addendum)
Date: 04/24/2019               Patient Name:  Darryl Johns MRN: [id number]  DOB: 04-24-1935 Age / Sex: 84 y.o., male   PCP: Kristie Cowman, MD         Medical Service: Internal Medicine Teaching Service         Attending Physician: Dr. Velna Ochs, MD    First Contact: Darrick Meigs, MD, Rylee Pager: [place] [phone])  Second Contact: Sharon Seller DO, Jaimie PagerJari Pigg [phone])       After Hours (After 5p/  First Contact Pager: [phone]  weekends / holidays): Second Contact Pager: [phone]   Chief Complaint: AMS after fall  History of Present Illness: 84 y.o. yo male w/ PMH significant for RBBB, peripheral neuropathy, orthostatic hypotension and suspected transverse colon cancer presenting to the ED via EMS for AMS after a fall. History obtained by patient, chart review, and spouse over the phone. Patient is a poor historian.   Per wife, patient has been having 3-4 weeks of hiccups and spitting up phlegm. He did not sleep well overnight due to the hiccups. He was in his usual state of health until approximately 2:30pm when he fell in the bathroom. Patient reports that he was trying to get up from the toilet when he fell to the ground. He denies hitting his head but does endorse loss of consciousness. His granddaughter then called the wife and EMS. Patient was down and unresponsive for approximately 15 minutes. He reports one week history of mild abdominal pain and loose stools that may have blood in them. He endorses multiple episodes per day of spitting up blood. He denies any fever, chills, cough, headache, dizziness, chest pain, shortness of breath or recent sick contacts.   In the ED, patient was hypotensive and tachycardic. He received IV fluids. Patient with lactic acidosis, leukocytosis and anemia. Negative for PE. CTA with possible aspiration pneumonia. Concerns for sepsis. Patient given cefepime, flagyl and vancomycin. Patient admitted for further work up.    Meds:   Current Meds  Medication Sig  . acetaminophen (TYLENOL) 500 MG tablet Take 500 mg by mouth every 6 (six) hours as needed for mild pain.   Marland Kitchen ARTIFICIAL TEAR OP Place 1 drop into both eyes daily.   . fluticasone (FLONASE) 50 MCG/ACT nasal spray Place 1 spray into both nostrils daily as needed for allergies.   . Ibuprofen (ADVIL) 200 MG CAPS Take 1 capsule by mouth daily as needed (for pain).   Marland Kitchen losartan (COZAAR) 50 MG tablet Take 1 tablet (50 mg total) by mouth daily.    Allergies: Allergies as of 04/23/2019  . (No Known Allergies)   Past Medical History:  Diagnosis Date  . Gait abnormality 09/04/2018  . Hypertension   . Peripheral neuropathy 09/04/2018    Family History:  Family History  Problem Relation Age of Onset  . Stroke Mother   . Lung cancer Brother   . Ovarian cancer Sister   . Cancer Brother      Social History:  Social History   Tobacco Use  . Smoking status: Current Every Day Smoker  . Smokeless tobacco: Never Used  Substance Use Topics  . Alcohol use: Yes  . Drug use: No     Review of Systems: A complete ROS was negative except as per HPI.   Physical Exam: Blood pressure 99/67, pulse (!) 117, temperature 97.7 F (36.5 C), resp. rate (!) 24, height 6\' 2"  (1.88 m), weight  70 kg, SpO2 100 %. Physical Exam Constitutional:      General: He is not in acute distress.    Appearance: He is ill-appearing. He is not diaphoretic.  HENT:     Head: Normocephalic and atraumatic.     Mouth/Throat:     Mouth: Mucous membranes are moist.     Pharynx: Oropharynx is clear. No oropharyngeal exudate or posterior oropharyngeal erythema.  Eyes:     General: No scleral icterus.    Extraocular Movements: Extraocular movements intact.     Conjunctiva/sclera: Conjunctivae normal.     Pupils: Pupils are equal, round, and reactive to light.  Neck:     Musculoskeletal: Normal range of motion and neck supple. No neck rigidity.  Cardiovascular:     Rate and Rhythm: Regular  rhythm. Tachycardia present.     Pulses: Normal pulses.     Heart sounds: Normal heart sounds. No murmur. No friction rub. No gallop.   Pulmonary:     Effort: Pulmonary effort is normal. No respiratory distress.     Breath sounds: No wheezing or rales.     Comments: Diminished bibasilar lung sounds  Abdominal:     General: Bowel sounds are normal. There is no distension.     Palpations: Abdomen is soft.     Tenderness: There is abdominal tenderness. There is no guarding.     Comments: Mild tenderness to palpation of left upper and right upper quadrants   Musculoskeletal: Normal range of motion.        General: No swelling or tenderness.  Lymphadenopathy:     Cervical: No cervical adenopathy.  Skin:    General: Skin is warm and dry.     Capillary Refill: Capillary refill takes less than 2 seconds.     Coloration: Skin is not jaundiced.     Findings: No bruising.  Neurological:     General: No focal deficit present.     Mental Status: He is alert and oriented to person, place, and time. Mental status is at baseline.     Cranial Nerves: No cranial nerve deficit.     Sensory: No sensory deficit.     Motor: No weakness.    EKG: personally reviewed my interpretation is sinus tachycardia.   CXR: Low lung volumes with bibasilar atelectasis   CT Head and Cervical Spine:  1. No acute intracranial abnormalities. 2. No skull fracture. 3. Moderate chronic microvascular ischemic change. 4. No acute fracture of cervical spine.   CT Angio Chest PE:  1. No evidence of pulmonary embolism. 2. Dependent areas of airspace consolidation in the right lower lobe, concerning for sequela of aspiration. 3. 1.2 x 1.4 cm well-circumscribed nodule in the left upper lobe. Consider one of the following in 3 months for both low-risk and high-risk individuals: (a) repeat chest CT or (b) follow-up PET-CT. (per Radiology recommendations) 4. Mild diffuse bronchial wall thickening with mild centrilobular and  paraseptal emphysema; imaging findings suggestive of underlying COPD. 5. Aortic atherosclerosis, in addition to left main and 3 vessel coronary artery disease. 6. Small volume of ascites.   Assessment & Plan by Problem:  Patient is an 84yo male with suspected colon cancer and history of orthostatic hypotension presenting after a fall at home with vitals and labs concerning for sepsis and CT Chest with RLL consolidation consistent with aspiration pneumonia and a possible GI bleed in setting of ongoing hematemesis for one month, drop in hemoglobin and positive FOBT .   Sepsis possibly secondary to aspiration  pneumonia  Patient presented via EMS after a fall in the bathroom with loss of consciousness for approximately 15 minutes. Patient denies head trauma but does report loss of consciousness. He does not recall any specific events after the fall. He has had a few weeks of hiccups and hematemesis. However, he denies any fevers, chills, cough, or recent sick contacts. Patient found to be severely hypotensive with BP 60/40 at home by EMS following his fall. In the ED, patient hypotensive and tachycardic with leukocytosis and elevated lactic acid. Patient started on IV fluids with slight improvement in BP but patient remains tachycardic. Physical examination remarkable for diminished bibasilar lung sounds and mild tenderness to palpation of the left and right upper quadrants of abdomen.  CT Head negative for any acute intracranial abnormalities. CT Angio of chest negative for PE but does have consolidation in right lower lobe, concerning for sequela of aspiration. UA negative for urinary source of infection. Although patient does have mild abdominal tenderness and history of loose stools, this is likely from the suspected colon cancer vs. Infectious etiology.  BCx pending.  - Continuous IV fluids - Continue cefepime and vancomycin  - Follow up blood cultures  - Sputum assessment/culture - Cardiac  monitoring - Strict I/O monitoring  Suspected colon cancer with possible GI bleed  Patient was evaluated by Dr. Johney Maine on 9/22 at the request of Dr. Ronnald Ramp with [facility] for concerns of an upper abdominal mass with failure to thrive. He had a finding of a 10x8cm mass involving the transverse colon, suspicious for cancer in the setting of unintentional weight loss. Patient has a positive family history of colon polyps and colon cancer. Patient has not had a colonoscopy yet. Per patient's wife, patient is scheduled for colonoscopy on 10/6 and for colectomy with Dr. Johney Maine on 10/16. Patient has had hiccups with hematemesis for the past 3-4 weeks. He also endorses loose stools with suspected blood. Hb/Hct 8.6/30.8 on presentation with subsequent H/H ~10/30. Baseline Hb 16 in 2019. FOBT positive. Consulted GI who will evaluate in the AM.  - Protonix 40mg  IV q12h - Follow up GI recommendations  - Cardiac monitoring - Monitor CBC - Vital signs   Hx of orthostatic hypotension:  Patient has history of orthostatic hypotension for which he has been evaluated by cardiology and neurology. Concerns for Sjogren's syndrome give peripheral neuropathy, elevated SSA antibody and Hx of dry eyes and mouth over last year for which he uses eye drops regularly. 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 No MRI or CT scan completed. Given patient's history of orthostatic hypotension and initial BP of 60/40 at home after fall, could be secondary to his orthostatic hypotension. Patient received IV fluids with improvement in BP to 97/54.  - Continue to monitor  - PT/OT evaluation once hemodynamically stable  Hx of RBBB:  Patient has history of RBBB for which he has followed with Dr. Debara Pickett since 2017. Last evaluation  Was in December 2019. EKG today with sinus tachycardia.  - Cardiac monitoring   Hx of HTN: Patient with Hx of HTN on losartan 50mg  PO  daily at home. On presentation, patient is hypotensive despite fluid resuscitation.  - Hold BP meds - Continue to monitor vital signs   Incidental CT Findings:  1x1cm well-circumscribed lung nodule in LUE on CT Chest. Patient does have history of smoking in the past but does not currently smoke. Recommended for follow up in 3 months with  repeat CT chest or PET-CT.  - Continue to monitor - Recommend for outpatient follow up on discharge   Diet: NPO Code: Full  VTE Prophylaxis: SCDs  Dispo: Admit patient to Inpatient with expected length of stay greater than 2 midnights.  SignedHarvie Heck, MD 04/02/2019, 12:34 AM  Pager: [phone]

## 2019-03-28 NOTE — ED Notes (Signed)
Contact Information (Wife) Jumal Hussain [phone]

## 2019-03-29 ENCOUNTER — Inpatient Hospital Stay (HOSPITAL_COMMUNITY): Payer: Medicare HMO

## 2019-03-29 ENCOUNTER — Encounter (HOSPITAL_COMMUNITY): Payer: Self-pay | Admitting: Internal Medicine

## 2019-03-29 ENCOUNTER — Inpatient Hospital Stay (HOSPITAL_COMMUNITY): Payer: Medicare HMO | Admitting: Anesthesiology

## 2019-03-29 ENCOUNTER — Encounter (HOSPITAL_COMMUNITY): Admission: EM | Disposition: E | Payer: Self-pay | Source: Home / Self Care | Attending: Pulmonary Disease

## 2019-03-29 DIAGNOSIS — R578 Other shock: Secondary | ICD-10-CM

## 2019-03-29 DIAGNOSIS — H9190 Unspecified hearing loss, unspecified ear: Secondary | ICD-10-CM

## 2019-03-29 DIAGNOSIS — R634 Abnormal weight loss: Secondary | ICD-10-CM

## 2019-03-29 DIAGNOSIS — R64 Cachexia: Secondary | ICD-10-CM

## 2019-03-29 DIAGNOSIS — R918 Other nonspecific abnormal finding of lung field: Secondary | ICD-10-CM

## 2019-03-29 DIAGNOSIS — K922 Gastrointestinal hemorrhage, unspecified: Secondary | ICD-10-CM

## 2019-03-29 DIAGNOSIS — K920 Hematemesis: Secondary | ICD-10-CM

## 2019-03-29 DIAGNOSIS — K562 Volvulus: Secondary | ICD-10-CM

## 2019-03-29 DIAGNOSIS — R1906 Epigastric swelling, mass or lump: Secondary | ICD-10-CM

## 2019-03-29 DIAGNOSIS — R579 Shock, unspecified: Secondary | ICD-10-CM

## 2019-03-29 DIAGNOSIS — N179 Acute kidney failure, unspecified: Secondary | ICD-10-CM

## 2019-03-29 DIAGNOSIS — R970 Elevated carcinoembryonic antigen [CEA]: Secondary | ICD-10-CM

## 2019-03-29 DIAGNOSIS — C184 Malignant neoplasm of transverse colon: Secondary | ICD-10-CM | POA: Insufficient documentation

## 2019-03-29 DIAGNOSIS — E43 Unspecified severe protein-calorie malnutrition: Secondary | ICD-10-CM | POA: Insufficient documentation

## 2019-03-29 HISTORY — PX: APPLICATION OF WOUND VAC: SHX5189

## 2019-03-29 HISTORY — PX: LAPAROTOMY: SHX154

## 2019-03-29 HISTORY — PX: ILEOSTOMY: SHX1783

## 2019-03-29 LAB — POCT I-STAT 7, (LYTES, BLD GAS, ICA,H+H)
Acid-base deficit: 6 mmol/L — ABNORMAL HIGH (ref 0.0–2.0)
Acid-base deficit: 5 mmol/L — ABNORMAL HIGH (ref 0.0–2.0)
Bicarbonate: 19.4 mmol/L — ABNORMAL LOW (ref 20.0–28.0)
Bicarbonate: 20.7 mmol/L (ref 20.0–28.0)
Calcium, Ion: 1.18 mmol/L (ref 1.15–1.40)
Calcium, Ion: 1.1 mmol/L — ABNORMAL LOW (ref 1.15–1.40)
HCT: 23 % — ABNORMAL LOW (ref 39.0–52.0)
HCT: 23 % — ABNORMAL LOW (ref 39.0–52.0)
Hemoglobin: 7.8 g/dL — ABNORMAL LOW (ref 13.0–17.0)
Hemoglobin: 7.8 g/dL — ABNORMAL LOW (ref 13.0–17.0)
O2 Saturation: 100 %
O2 Saturation: 99 %
Patient temperature: 98.6
Patient temperature: 35
Potassium: 3.9 mmol/L (ref 3.5–5.1)
Potassium: 4 mmol/L (ref 3.5–5.1)
Sodium: 137 mmol/L (ref 135–145)
Sodium: 139 mmol/L (ref 135–145)
TCO2: 20 mmol/L — ABNORMAL LOW (ref 22–32)
TCO2: 22 mmol/L (ref 22–32)
pCO2 arterial: 34.8 mmHg (ref 32.0–48.0)
pCO2 arterial: 37.2 mmHg (ref 32.0–48.0)
pH, Arterial: 7.355 (ref 7.350–7.450)
pH, Arterial: 7.343 — ABNORMAL LOW (ref 7.350–7.450)
pO2, Arterial: 288 mmHg — ABNORMAL HIGH (ref 83.0–108.0)
pO2, Arterial: 136 mmHg — ABNORMAL HIGH (ref 83.0–108.0)

## 2019-03-29 LAB — CBC WITH DIFFERENTIAL/PLATELET
Abs Immature Granulocytes: 0.05 10*3/uL (ref 0.00–0.07)
Basophils Absolute: 0 10*3/uL (ref 0.0–0.1)
Basophils Relative: 0 %
Eosinophils Absolute: 0 10*3/uL (ref 0.0–0.5)
Eosinophils Relative: 0 %
HCT: 28.2 % — ABNORMAL LOW (ref 39.0–52.0)
Hemoglobin: 9.2 g/dL — ABNORMAL LOW (ref 13.0–17.0)
Immature Granulocytes: 0 %
Lymphocytes Relative: 7 %
Lymphs Abs: 0.9 10*3/uL (ref 0.7–4.0)
MCH: 25.1 pg — ABNORMAL LOW (ref 26.0–34.0)
MCHC: 32.6 g/dL (ref 30.0–36.0)
MCV: 77 fL — ABNORMAL LOW (ref 80.0–100.0)
Monocytes Absolute: 0.7 10*3/uL (ref 0.1–1.0)
Monocytes Relative: 6 %
Neutro Abs: 11 10*3/uL — ABNORMAL HIGH (ref 1.7–7.7)
Neutrophils Relative %: 87 %
Platelets: 390 10*3/uL (ref 150–400)
RBC: 3.66 MIL/uL — ABNORMAL LOW (ref 4.22–5.81)
RDW: 16.9 % — ABNORMAL HIGH (ref 11.5–15.5)
WBC: 12.8 10*3/uL — ABNORMAL HIGH (ref 4.0–10.5)
nRBC: 0 % (ref 0.0–0.2)

## 2019-03-29 LAB — COMPREHENSIVE METABOLIC PANEL
ALT: 21 U/L (ref 0–44)
AST: 43 U/L — ABNORMAL HIGH (ref 15–41)
Albumin: 1.9 g/dL — ABNORMAL LOW (ref 3.5–5.0)
Alkaline Phosphatase: 61 U/L (ref 38–126)
Anion gap: 12 (ref 5–15)
BUN: 38 mg/dL — ABNORMAL HIGH (ref 8–23)
CO2: 19 mmol/L — ABNORMAL LOW (ref 22–32)
Calcium: 8.4 mg/dL — ABNORMAL LOW (ref 8.9–10.3)
Chloride: 104 mmol/L (ref 98–111)
Creatinine, Ser: 1.52 mg/dL — ABNORMAL HIGH (ref 0.61–1.24)
GFR calc Af Amer: 49 mL/min — ABNORMAL LOW (ref >60)
GFR calc non Af Amer: 42 mL/min — ABNORMAL LOW (ref >60)
Glucose, Bld: 110 mg/dL — ABNORMAL HIGH (ref 70–99)
Potassium: 4 mmol/L (ref 3.5–5.1)
Sodium: 135 mmol/L (ref 135–145)
Total Bilirubin: 0.5 mg/dL (ref 0.3–1.2)
Total Protein: 6.1 g/dL — ABNORMAL LOW (ref 6.5–8.1)

## 2019-03-29 LAB — BASIC METABOLIC PANEL
Anion gap: 15 (ref 5–15)
BUN: 34 mg/dL — ABNORMAL HIGH (ref 8–23)
CO2: 19 mmol/L — ABNORMAL LOW (ref 22–32)
Calcium: 8.5 mg/dL — ABNORMAL LOW (ref 8.9–10.3)
Chloride: 101 mmol/L (ref 98–111)
Creatinine, Ser: 1.43 mg/dL — ABNORMAL HIGH (ref 0.61–1.24)
GFR calc Af Amer: 52 mL/min — ABNORMAL LOW (ref >60)
GFR calc non Af Amer: 45 mL/min — ABNORMAL LOW (ref >60)
Glucose, Bld: 124 mg/dL — ABNORMAL HIGH (ref 70–99)
Potassium: 4.1 mmol/L (ref 3.5–5.1)
Sodium: 135 mmol/L (ref 135–145)

## 2019-03-29 LAB — URINE CULTURE: Culture: NO GROWTH

## 2019-03-29 LAB — MRSA PCR SCREENING: MRSA by PCR: NEGATIVE

## 2019-03-29 LAB — PREPARE RBC (CROSSMATCH)

## 2019-03-29 LAB — URINALYSIS, ROUTINE W REFLEX MICROSCOPIC
Bacteria, UA: NONE SEEN
Bilirubin Urine: NEGATIVE
Glucose, UA: NEGATIVE mg/dL
Ketones, ur: NEGATIVE mg/dL
Leukocytes,Ua: NEGATIVE
Nitrite: NEGATIVE
Protein, ur: 100 mg/dL — AB
Specific Gravity, Urine: 1.032 — ABNORMAL HIGH (ref 1.005–1.030)
pH: 6 (ref 5.0–8.0)

## 2019-03-29 LAB — GLUCOSE, CAPILLARY
Glucose-Capillary: 74 mg/dL (ref 70–99)
Glucose-Capillary: 74 mg/dL (ref 70–99)
Glucose-Capillary: 89 mg/dL (ref 70–99)

## 2019-03-29 LAB — HEMOGLOBIN A1C
Hgb A1c MFr Bld: 5.6 % (ref 4.8–5.6)
Mean Plasma Glucose: 114.02 mg/dL

## 2019-03-29 LAB — PROTIME-INR
INR: 1.4 — ABNORMAL HIGH (ref 0.8–1.2)
Prothrombin Time: 17.4 seconds — ABNORMAL HIGH (ref 11.4–15.2)

## 2019-03-29 LAB — LACTIC ACID, PLASMA: Lactic Acid, Venous: 2.2 mmol/L (ref 0.5–1.9)

## 2019-03-29 SURGERY — LAPAROTOMY, EXPLORATORY
Anesthesia: General | Site: Abdomen

## 2019-03-29 MED ORDER — BISACODYL 5 MG PO TBEC: 20.0000 mg | DELAYED_RELEASE_TABLET | Freq: Once | ORAL | Status: DC

## 2019-03-29 MED ORDER — LACTATED RINGERS IV SOLN
INTRAVENOUS | Status: DC | PRN
  Administered 2019-03-29 (×2): via INTRAVENOUS

## 2019-03-29 MED ORDER — SODIUM CHLORIDE 0.9% IV SOLUTION
Freq: Once | INTRAVENOUS | Status: AC
  Administered 2019-03-29: 10:00:00 via INTRAVENOUS

## 2019-03-29 MED ORDER — PANTOPRAZOLE SODIUM 40 MG IV SOLR: 40.0000 mg | Freq: Two times a day (BID) | INTRAVENOUS | Status: DC

## 2019-03-29 MED ORDER — POLYETHYLENE GLYCOL 3350 17 GM/SCOOP PO POWD: 1.0000 | Freq: Once | ORAL | Status: DC

## 2019-03-29 MED ORDER — EPHEDRINE 5 MG/ML INJ
INTRAVENOUS | Status: AC
  Filled 2019-03-29: qty 10

## 2019-03-29 MED ORDER — MENTHOL 3 MG MT LOZG: 1.0000 | LOZENGE | OROMUCOSAL | Status: DC | PRN

## 2019-03-29 MED ORDER — ROCURONIUM BROMIDE 50 MG/5ML IV SOLN
50.0000 mg | Freq: Once | INTRAVENOUS | Status: AC
  Administered 2019-03-29: 50 mg via INTRAVENOUS

## 2019-03-29 MED ORDER — DEXMEDETOMIDINE HCL IN NACL 200 MCG/50ML IV SOLN
INTRAVENOUS | Status: AC
  Filled 2019-03-29: qty 50

## 2019-03-29 MED ORDER — LIDOCAINE 2% (20 MG/ML) 5 ML SYRINGE
INTRAMUSCULAR | Status: AC
  Filled 2019-03-29: qty 5

## 2019-03-29 MED ORDER — MIDAZOLAM HCL 2 MG/2ML IJ SOLN
INTRAMUSCULAR | Status: AC
  Filled 2019-03-29: qty 2

## 2019-03-29 MED ORDER — HYDROCORTISONE (PERIANAL) 2.5 % EX CREA
1.0000 "application " | TOPICAL_CREAM | Freq: Four times a day (QID) | CUTANEOUS | Status: DC | PRN
  Filled 2019-03-29: qty 28.35

## 2019-03-29 MED ORDER — ALBUMIN HUMAN 5 % IV SOLN
INTRAVENOUS | Status: DC | PRN
  Administered 2019-03-29 (×3): via INTRAVENOUS

## 2019-03-29 MED ORDER — VITAMIN K1 10 MG/ML IJ SOLN
10.0000 mg | Freq: Once | INTRAVENOUS | Status: AC
  Administered 2019-03-29: 10 mg via INTRAVENOUS
  Filled 2019-03-29: qty 1

## 2019-03-29 MED ORDER — PROPOFOL 10 MG/ML IV BOLUS
INTRAVENOUS | Status: AC
  Filled 2019-03-29: qty 20

## 2019-03-29 MED ORDER — CHLORHEXIDINE GLUCONATE CLOTH 2 % EX PADS
6.0000 | MEDICATED_PAD | Freq: Every day | CUTANEOUS | Status: DC
  Administered 2019-03-29 – 2019-04-12 (×14): 6 via TOPICAL

## 2019-03-29 MED ORDER — ORAL CARE MOUTH RINSE
15.0000 mL | OROMUCOSAL | Status: DC
  Administered 2019-03-29 – 2019-04-12 (×138): 15 mL via OROMUCOSAL

## 2019-03-29 MED ORDER — FENTANYL CITRATE (PF) 100 MCG/2ML IJ SOLN
25.0000 ug | INTRAMUSCULAR | Status: AC | PRN
  Administered 2019-03-29 – 2019-03-30 (×3): 25 ug via INTRAVENOUS
  Filled 2019-03-29: qty 2

## 2019-03-29 MED ORDER — MIDAZOLAM HCL 2 MG/2ML IJ SOLN
2.0000 mg | Freq: Once | INTRAMUSCULAR | Status: AC
  Administered 2019-03-29: 2 mg via INTRAVENOUS

## 2019-03-29 MED ORDER — FENTANYL CITRATE (PF) 100 MCG/2ML IJ SOLN
25.0000 ug | INTRAMUSCULAR | Status: DC | PRN
  Administered 2019-03-30: 50 ug via INTRAVENOUS
  Administered 2019-03-30: 100 ug via INTRAVENOUS
  Administered 2019-03-30: 25 ug via INTRAVENOUS
  Administered 2019-03-31: 50 ug via INTRAVENOUS
  Administered 2019-03-31: 100 ug via INTRAVENOUS

## 2019-03-29 MED ORDER — ALVIMOPAN 12 MG PO CAPS
12.0000 mg | ORAL_CAPSULE | ORAL | Status: DC
  Filled 2019-03-29: qty 1

## 2019-03-29 MED ORDER — ROCURONIUM BROMIDE 10 MG/ML (PF) SYRINGE
PREFILLED_SYRINGE | INTRAVENOUS | Status: DC | PRN
  Administered 2019-03-29: 100 mg via INTRAVENOUS

## 2019-03-29 MED ORDER — ALUM & MAG HYDROXIDE-SIMETH 200-200-20 MG/5ML PO SUSP: 30.0000 mL | Freq: Four times a day (QID) | ORAL | Status: DC | PRN

## 2019-03-29 MED ORDER — CHLORHEXIDINE GLUCONATE 0.12% ORAL RINSE (MEDLINE KIT)
15.0000 mL | Freq: Two times a day (BID) | OROMUCOSAL | Status: DC
  Administered 2019-03-29 – 2019-04-12 (×29): 15 mL via OROMUCOSAL

## 2019-03-29 MED ORDER — LACTATED RINGERS IV BOLUS
1000.0000 mL | Freq: Once | INTRAVENOUS | Status: AC
  Administered 2019-03-29: 14:00:00 1000 mL via INTRAVENOUS

## 2019-03-29 MED ORDER — LACTATED RINGERS IV BOLUS
1000.0000 mL | Freq: Once | INTRAVENOUS | Status: AC
  Administered 2019-03-29: 1000 mL via INTRAVENOUS

## 2019-03-29 MED ORDER — SODIUM CHLORIDE 0.9 % IV SOLN
80.0000 mg | Freq: Once | INTRAVENOUS | Status: AC
  Administered 2019-03-29: 80 mg via INTRAVENOUS
  Filled 2019-03-29: qty 80

## 2019-03-29 MED ORDER — NOREPINEPHRINE 4 MG/250ML-% IV SOLN
0.0000 ug/min | INTRAVENOUS | Status: DC
  Administered 2019-03-29: 8 ug/min via INTRAVENOUS
  Administered 2019-03-30: 22:00:00 10 ug/min via INTRAVENOUS
  Administered 2019-03-30: 06:00:00 8 ug/min via INTRAVENOUS
  Administered 2019-03-30: 16:00:00 7 ug/min via INTRAVENOUS
  Administered 2019-03-31: 20:00:00 9 ug/min via INTRAVENOUS
  Administered 2019-03-31: 09:00:00 5 ug/min via INTRAVENOUS
  Administered 2019-03-31: 14:00:00 14 ug/min via INTRAVENOUS
  Administered 2019-04-01: 05:00:00 6 ug/min via INTRAVENOUS
  Filled 2019-03-29 (×6): qty 250

## 2019-03-29 MED ORDER — VANCOMYCIN VARIABLE DOSE PER UNSTABLE RENAL FUNCTION (PHARMACIST DOSING): Status: DC

## 2019-03-29 MED ORDER — PHENYLEPHRINE 40 MCG/ML (10ML) SYRINGE FOR IV PUSH (FOR BLOOD PRESSURE SUPPORT)
PREFILLED_SYRINGE | INTRAVENOUS | Status: AC
  Filled 2019-03-29: qty 10

## 2019-03-29 MED ORDER — FENTANYL 2500MCG IN NS 250ML (10MCG/ML) PREMIX INFUSION
0.0000 ug/h | INTRAVENOUS | Status: DC
  Administered 2019-03-29: 25 ug/h via INTRAVENOUS
  Administered 2019-03-30 – 2019-03-31 (×2): 200 ug/h via INTRAVENOUS
  Administered 2019-03-31 – 2019-04-01 (×2): 300 ug/h via INTRAVENOUS
  Administered 2019-04-02: 125 ug/h via INTRAVENOUS
  Administered 2019-04-03: 175 ug/h via INTRAVENOUS
  Filled 2019-03-29 (×9): qty 250

## 2019-03-29 MED ORDER — ALBUTEROL SULFATE (2.5 MG/3ML) 0.083% IN NEBU: 2.5000 mg | INHALATION_SOLUTION | RESPIRATORY_TRACT | Status: DC | PRN

## 2019-03-29 MED ORDER — CHLORHEXIDINE GLUCONATE 0.12 % MT SOLN
15.0000 mL | Freq: Two times a day (BID) | OROMUCOSAL | Status: DC
  Administered 2019-03-29: 13:00:00 15 mL via OROMUCOSAL

## 2019-03-29 MED ORDER — IOHEXOL 300 MG/ML  SOLN
100.0000 mL | Freq: Once | INTRAMUSCULAR | Status: AC | PRN
  Administered 2019-03-29: 80 mL via INTRAVENOUS

## 2019-03-29 MED ORDER — SODIUM CHLORIDE 0.9 % IV SOLN
INTRAVENOUS | Status: DC | PRN
  Administered 2019-03-29 (×2): 250 mL via INTRAVENOUS
  Administered 2019-03-31 – 2019-04-07 (×2): 500 mL via INTRAVENOUS

## 2019-03-29 MED ORDER — SODIUM CHLORIDE 0.9 % IV SOLN
2.0000 g | Freq: Two times a day (BID) | INTRAVENOUS | Status: AC
  Administered 2019-03-29: 2 g via INTRAVENOUS
  Filled 2019-03-29: qty 2

## 2019-03-29 MED ORDER — 0.9 % SODIUM CHLORIDE (POUR BTL) OPTIME
TOPICAL | Status: DC | PRN
  Administered 2019-03-29 (×5): 1000 mL

## 2019-03-29 MED ORDER — PHENOL 1.4 % MT LIQD: 1.0000 | OROMUCOSAL | Status: DC | PRN

## 2019-03-29 MED ORDER — METRONIDAZOLE 500 MG PO TABS: 1000.0000 mg | ORAL_TABLET | ORAL | Status: DC

## 2019-03-29 MED ORDER — SODIUM CHLORIDE 0.9% IV SOLUTION: Freq: Once | INTRAVENOUS | Status: DC

## 2019-03-29 MED ORDER — ALBUTEROL SULFATE (2.5 MG/3ML) 0.083% IN NEBU: 2.5000 mg | INHALATION_SOLUTION | RESPIRATORY_TRACT | Status: DC

## 2019-03-29 MED ORDER — SODIUM CHLORIDE 0.9 % IV SOLN
2.0000 g | INTRAVENOUS | Status: AC
  Administered 2019-03-29: 2 g via INTRAVENOUS
  Filled 2019-03-29: qty 2

## 2019-03-29 MED ORDER — ETOMIDATE 2 MG/ML IV SOLN
20.0000 mg | Freq: Once | INTRAVENOUS | Status: AC
  Administered 2019-03-29: 20 mg via INTRAVENOUS

## 2019-03-29 MED ORDER — MAGIC MOUTHWASH
15.0000 mL | Freq: Four times a day (QID) | ORAL | Status: DC | PRN
  Filled 2019-03-29: qty 15

## 2019-03-29 MED ORDER — SODIUM CHLORIDE 0.9 % IV SOLN
2.0000 g | INTRAVENOUS | Status: DC
  Filled 2019-03-29: qty 2

## 2019-03-29 MED ORDER — LACTATED RINGERS IV BOLUS
1000.0000 mL | Freq: Three times a day (TID) | INTRAVENOUS | Status: AC | PRN
  Administered 2019-03-30: 1000 mL via INTRAVENOUS

## 2019-03-29 MED ORDER — NEOMYCIN SULFATE 500 MG PO TABS: 1000.0000 mg | ORAL_TABLET | ORAL | Status: DC

## 2019-03-29 MED ORDER — SODIUM CHLORIDE 0.9 % IV SOLN
8.0000 mg/h | INTRAVENOUS | Status: DC
  Administered 2019-03-29 – 2019-04-01 (×7): 8 mg/h via INTRAVENOUS
  Filled 2019-03-29 (×8): qty 80

## 2019-03-29 MED ORDER — FENTANYL CITRATE (PF) 250 MCG/5ML IJ SOLN
INTRAMUSCULAR | Status: AC
  Filled 2019-03-29: qty 5

## 2019-03-29 MED ORDER — LIP MEDEX EX OINT
1.0000 "application " | TOPICAL_OINTMENT | Freq: Two times a day (BID) | CUTANEOUS | Status: DC
  Administered 2019-03-29 – 2019-04-12 (×28): 1 via TOPICAL
  Filled 2019-03-29 (×2): qty 7

## 2019-03-29 MED ORDER — DEXMEDETOMIDINE HCL IN NACL 200 MCG/50ML IV SOLN
INTRAVENOUS | Status: DC | PRN
  Administered 2019-03-29: 0.7 ug/kg/h via INTRAVENOUS

## 2019-03-29 MED ORDER — BUPIVACAINE LIPOSOME 1.3 % IJ SUSP
20.0000 mL | Freq: Once | INTRAMUSCULAR | Status: DC
  Filled 2019-03-29: qty 20

## 2019-03-29 MED ORDER — MIDAZOLAM HCL 2 MG/2ML IJ SOLN
INTRAMUSCULAR | Status: AC
  Filled 2019-03-29: qty 4

## 2019-03-29 MED ORDER — GUAIFENESIN-DM 100-10 MG/5ML PO SYRP
10.0000 mL | ORAL_SOLUTION | ORAL | Status: DC | PRN
  Filled 2019-03-29: qty 10

## 2019-03-29 MED ORDER — ROCURONIUM BROMIDE 10 MG/ML (PF) SYRINGE
PREFILLED_SYRINGE | INTRAVENOUS | Status: AC
  Filled 2019-03-29: qty 10

## 2019-03-29 MED ORDER — MIDAZOLAM HCL 2 MG/2ML IJ SOLN
INTRAMUSCULAR | Status: DC | PRN
  Administered 2019-03-29: 4 mg via INTRAVENOUS

## 2019-03-29 MED ORDER — HYDROCORTISONE 1 % EX CREA
1.0000 "application " | TOPICAL_CREAM | Freq: Three times a day (TID) | CUTANEOUS | Status: DC | PRN
  Filled 2019-03-29: qty 28

## 2019-03-29 MED ORDER — GABAPENTIN 300 MG PO CAPS: 300.0000 mg | ORAL_CAPSULE | ORAL | Status: DC

## 2019-03-29 MED ORDER — FENTANYL CITRATE (PF) 100 MCG/2ML IJ SOLN
INTRAMUSCULAR | Status: AC
  Filled 2019-03-29: qty 2

## 2019-03-29 MED ORDER — ACETAMINOPHEN 500 MG PO TABS: 1000.0000 mg | ORAL_TABLET | ORAL | Status: DC

## 2019-03-29 MED ORDER — ENOXAPARIN SODIUM 40 MG/0.4ML ~~LOC~~ SOLN: 40.0000 mg | Freq: Once | SUBCUTANEOUS | Status: DC

## 2019-03-29 MED ORDER — NOREPINEPHRINE 4 MG/250ML-% IV SOLN
INTRAVENOUS | Status: AC
  Administered 2019-03-29: 15:00:00 4 mg
  Filled 2019-03-29: qty 250

## 2019-03-29 MED ORDER — CHLORHEXIDINE GLUCONATE CLOTH 2 % EX PADS
6.0000 | MEDICATED_PAD | Freq: Once | CUTANEOUS | Status: AC
  Administered 2019-03-29: 6 via TOPICAL

## 2019-03-29 MED ORDER — FENTANYL CITRATE (PF) 100 MCG/2ML IJ SOLN
50.0000 ug | Freq: Once | INTRAMUSCULAR | Status: AC
  Administered 2019-03-29: 15:00:00 50 ug via INTRAVENOUS

## 2019-03-29 MED ORDER — PROPOFOL 1000 MG/100ML IV EMUL: 5.0000 ug/kg/min | INTRAVENOUS | Status: DC

## 2019-03-29 MED ORDER — METRONIDAZOLE IN NACL 5-0.79 MG/ML-% IV SOLN
500.0000 mg | Freq: Three times a day (TID) | INTRAVENOUS | Status: DC
  Administered 2019-03-29 – 2019-04-01 (×9): 500 mg via INTRAVENOUS
  Filled 2019-03-29 (×9): qty 100

## 2019-03-29 MED ORDER — SODIUM BICARBONATE 4.2 % IV SOLN
INTRAVENOUS | Status: DC | PRN
  Administered 2019-03-29: 50 meq via INTRAVENOUS

## 2019-03-29 MED ORDER — SODIUM CHLORIDE 0.9 % IV SOLN: INTRAVENOUS | Status: DC | PRN

## 2019-03-29 MED ORDER — ORAL CARE MOUTH RINSE
15.0000 mL | Freq: Two times a day (BID) | OROMUCOSAL | Status: DC
  Administered 2019-03-29 (×2): 15 mL via OROMUCOSAL

## 2019-03-29 MED ORDER — CEFOTETAN DISODIUM-DEXTROSE 2-2.08 GM-%(50ML) IV SOLR
INTRAVENOUS | Status: AC
  Filled 2019-03-29: qty 50

## 2019-03-29 MED ORDER — SODIUM CHLORIDE 0.9 % IV SOLN
INTRAVENOUS | Status: DC | PRN
  Administered 2019-03-29 (×2): via INTRAVENOUS

## 2019-03-29 MED ORDER — LACTATED RINGERS IV SOLN
INTRAVENOUS | Status: DC
  Administered 2019-03-29 – 2019-03-30 (×2): via INTRAVENOUS

## 2019-03-29 MED ORDER — VASOPRESSIN 20 UNIT/ML IV SOLN
0.0300 [IU]/min | INTRAVENOUS | Status: DC
  Administered 2019-03-30 – 2019-03-31 (×2): 0.03 [IU]/min via INTRAVENOUS
  Filled 2019-03-29 (×3): qty 2

## 2019-03-29 MED ORDER — SODIUM CHLORIDE 0.9 % IV SOLN
INTRAVENOUS | Status: DC
  Filled 2019-03-29: qty 6

## 2019-03-29 MED ORDER — FENTANYL CITRATE (PF) 250 MCG/5ML IJ SOLN
INTRAMUSCULAR | Status: DC | PRN
  Administered 2019-03-29 (×3): 50 ug via INTRAVENOUS
  Administered 2019-03-29: 100 ug via INTRAVENOUS

## 2019-03-29 SURGICAL SUPPLY — 48 items
BLADE CLIPPER SURG (BLADE) IMPLANT
CANISTER SUCT 3000ML PPV (MISCELLANEOUS) ×4 IMPLANT
CANISTER WOUND CARE 500ML ATS (WOUND CARE) ×2 IMPLANT
CHLORAPREP W/TINT 26 (MISCELLANEOUS) ×4 IMPLANT
COVER SURGICAL LIGHT HANDLE (MISCELLANEOUS) ×4 IMPLANT
COVER WAND RF STERILE (DRAPES) ×4 IMPLANT
DRAPE LAPAROSCOPIC ABDOMINAL (DRAPES) ×4 IMPLANT
DRAPE WARM FLUID 44X44 (DRAPES) ×4 IMPLANT
DRSG OPSITE POSTOP 4X10 (GAUZE/BANDAGES/DRESSINGS) IMPLANT
DRSG OPSITE POSTOP 4X8 (GAUZE/BANDAGES/DRESSINGS) IMPLANT
DRSG VAC ATS MED SENSATRAC (GAUZE/BANDAGES/DRESSINGS) ×2 IMPLANT
ELECT BLADE 6.5 EXT (BLADE) IMPLANT
ELECT REM PT RETURN 9FT ADLT (ELECTROSURGICAL) ×4
ELECTRODE REM PT RTRN 9FT ADLT (ELECTROSURGICAL) ×2 IMPLANT
GLOVE ECLIPSE 8.0 STRL XLNG CF (GLOVE) ×4 IMPLANT
GLOVE INDICATOR 8.0 STRL GRN (GLOVE) ×4 IMPLANT
GOWN STRL REUS W/ TWL LRG LVL3 (GOWN DISPOSABLE) ×2 IMPLANT
GOWN STRL REUS W/ TWL XL LVL3 (GOWN DISPOSABLE) ×2 IMPLANT
GOWN STRL REUS W/TWL LRG LVL3 (GOWN DISPOSABLE) ×2
GOWN STRL REUS W/TWL XL LVL3 (GOWN DISPOSABLE) ×2
HANDLE SUCTION POOLE (INSTRUMENTS) ×2 IMPLANT
KIT BASIN OR (CUSTOM PROCEDURE TRAY) ×4 IMPLANT
KIT OSTOMY DRAINABLE 2.75 STR (WOUND CARE) ×2 IMPLANT
KIT TURNOVER KIT B (KITS) ×4 IMPLANT
LIGASURE IMPACT 36 18CM CVD LR (INSTRUMENTS) ×2 IMPLANT
NS IRRIG 1000ML POUR BTL (IV SOLUTION) ×8 IMPLANT
PACK GENERAL/GYN (CUSTOM PROCEDURE TRAY) ×4 IMPLANT
PAD ARMBOARD 7.5X6 YLW CONV (MISCELLANEOUS) ×4 IMPLANT
PENCIL SMOKE EVACUATOR (MISCELLANEOUS) ×4 IMPLANT
RELOAD PROXIMATE 75MM BLUE (ENDOMECHANICALS) ×16 IMPLANT
RELOAD STAPLE 75 3.8 BLU REG (ENDOMECHANICALS) IMPLANT
SPECIMEN JAR LARGE (MISCELLANEOUS) IMPLANT
SPONGE LAP 18X18 RF (DISPOSABLE) ×2 IMPLANT
SPONGE LAP 18X18 X RAY DECT (DISPOSABLE) ×4 IMPLANT
STAPLER 90 3.5 STAND SLIM (STAPLE) ×4
STAPLER 90 3.5 STD SLIM (STAPLE) IMPLANT
STAPLER PROXIMATE 75MM BLUE (STAPLE) ×4 IMPLANT
STAPLER VISISTAT 35W (STAPLE) ×4 IMPLANT
SUCTION POOLE HANDLE (INSTRUMENTS) ×4
SUT PDS AB 1 TP1 96 (SUTURE) ×8 IMPLANT
SUT PDS AB 3-0 SH 27 (SUTURE) ×4 IMPLANT
SUT PROLENE 2 0 SH DA (SUTURE) ×2 IMPLANT
SUT SILK 2 0 SH CR/8 (SUTURE) ×6 IMPLANT
SUT SILK 2 0 TIES 10X30 (SUTURE) ×4 IMPLANT
SUT SILK 3 0 SH CR/8 (SUTURE) ×4 IMPLANT
SUT SILK 3 0 TIES 10X30 (SUTURE) ×4 IMPLANT
SUT VIC AB 3-0 SH 18 (SUTURE) ×6 IMPLANT
TOWEL GREEN STERILE (TOWEL DISPOSABLE) ×4 IMPLANT

## 2019-03-29 NOTE — Consult Note (Addendum)
Consultation  Referring Provider:      Primary Care Physician:  Kristie Cowman, MD Primary Gastroenterologist:         Reason for Consultation:          Impression / Plan:  Patient presents with coffee-ground emesis some minor hematochezia and epigastric pain and mass.  He has a huge mass thought to be a colon cancer.  I am not sure what is going on at this point, I do not think he is perforated but he could have some sort of sepsis related to this mass.  He does not seem to be having a major upper GI bleed based upon all the available data.  He is getting excellent supportive care and we will treat with PPI and have a CT of the abdomen and pelvis.  He could need some sort of urgent surgery.  He could need an endoscopy versus colonoscopy.  It is too soon to make that judgment.   At this point he could have a small bowel obstruction, he could have a low-grade perforation of his cancer that we suspect he has.  He has pneumobilia that they think might be related to prior intervention though we know of no history of prior ERCP so that could be a bad sign of portal venous gas.  NH3 244 - ? undx liver dz but not suggested by imaging  We will see what the CT shows is and go from there.  I discussed with Drs. Mannam and Dr. gross.        Gatha Mayer, MD, [facility] 04/17/2019 9:46 AM Pager [phone]   HPI:   Darryl Johns is a 84 y.o. male who presented to the emergency department overnight in shock, he has a history of recently diagnosed 10 x 8 cm mass in the upper abdominal that seen suspicious for cancer on CT scan evaluation by Dr. gross.  He was to have a colonoscopy at [facility], his primary care site.  That has not been done.  Yesterday late he was weak he fell he vomited sounds like it is been coffee grounds, no red hematemesis.  He has been hypotensive and shocky here and has been treated with antibiotics.  Pulmonary embolism was ruled out but it  does show a lung nodule which we were aware of.  He has had a lot of unintentional weight loss and appetite loss.  Peripheral neuropathy has been an issue.  Apparently his twin brother died recently perhaps from colon cancer though his wife does not know for sure.  He had been moving his bowels okay.  He is having some epigastric pain now.  Nurse in the ER reports that he has had some streaky red blood with his stool.   History from Dr. Clyda Greener recent evaluation The patient is a thin elderly male who's had some decreased appetite and fatigue. Unintentional weight loss. Was concerned and followed up with his primary care office. Concern of a mass in his upper abdomen. CT scan and pelvis confirmed a 10 x 8 cm mass in his upper abdomen that seemed to be involved with his transverse colon suspicious for cancer. He comes today with his wife and sister. Patient himself is rather hard of hearing and tends to want to be differential to his family. Patient's sister notes there is a strong family history of colon polyps and cancer. She needed surgery. His twin brother died possibly from that as well although they don't  know for certain. Patient used to smoke but has not done that and over a decade. Usually moves his bowels once a day. He's had some balance issues followed by Dr. Jannifer Franklin that a been mostly underwhelming. Usually walks with a cane. He has a walker today to sit down just for bowels purposes. Denies any exertional shortness of breath. He has never had any abdominal surgery. Spied strong family history of colorectal cancer, he himself has never had a colonoscopy. He is due to get one done next month (2 weeks from now). Patient denies any abdominal bloating. No nausea or vomiting. Usually moves his bowels once a day. He does take a Fiber supplement and his wife gives him. No worsening constipation. Energy level and appetite up and down especially this last month. He's probably lost about  10 pounds over the past month. No major weight change in the past week since the family has been trying to encourage him to drink some supplemental shakes. Patient and family trying to process all this, but I get the sense that they are feeling overwhelmed and confused.  Past Medical History:  Diagnosis Date   Allergic rhinitis    Colonic mass    Gait abnormality 09/04/2018   Hypertension    Peripheral neuropathy 09/04/2018   RBBB     No past surgical history on file.  Family History  Problem Relation Age of Onset   Stroke Mother    Lung cancer Brother    Ovarian cancer Sister    Cancer Brother        Possibly colon cancer    Social History   Tobacco Use   Smoking status: Former Smoker   Smokeless tobacco: Never Used  Substance Use Topics   Alcohol use: Yes   Drug use: No   Social History   Social History Narrative   Right handed   2 cups daily of caffeine    Lives at home with wife    Has children.   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  Former smoker no alcohol now    Prior to Admission medications   Medication Sig Start Date End Date Taking? Authorizing Provider  acetaminophen (TYLENOL) 500 MG tablet Take 500 mg by mouth every 6 (six) hours as needed for mild pain.    Yes [provider]  ARTIFICIAL TEAR OP Place 1 drop into both eyes daily.    Yes [provider]  fluticasone (FLONASE) 50 MCG/ACT nasal spray Place 1 spray into both nostrils daily as needed for allergies.    Yes [provider]  Ibuprofen (ADVIL) 200 MG CAPS Take 1 capsule by mouth daily as needed (for pain).    Yes [provider]  losartan (COZAAR) 50 MG tablet Take 1 tablet (50 mg total) by mouth daily. 05/17/18  Yes Hilty, Nadean Corwin, MD    Current Facility-Administered Medications  Medication Dose Route Frequency Provider Last Rate Last Dose   0.9 %  sodium chloride infusion (Manually program via Guardrails IV Fluids)   Intravenous Once  Seawell, Jaimie A, DO       0.9 %  sodium chloride infusion (Manually program via Guardrails IV Fluids)   Intravenous Once Seawell, Jaimie A, DO       acetaminophen (TYLENOL) tablet 650 mg  650 mg Oral Q6H PRN Asencion Noble, MD       Or   acetaminophen (TYLENOL) suppository 650 mg  650 mg Rectal Q6H PRN Asencion Noble, MD  ceFEPIme (MAXIPIME) 2 g in sodium chloride 0.9 % 100 mL IVPB  2 g Intravenous Q12H Asencion Noble, MD   Stopped at 03/28/2019 [id number]   lactated ringers infusion   Intravenous Continuous Asencion Noble, MD   Stopped at 04/01/2019 [phone]   metroNIDAZOLE (FLAGYL) IVPB 500 mg  500 mg Intravenous Q8H Mannam, Praveen, MD       ondansetron (ZOFRAN) tablet 4 mg  4 mg Oral Q6H PRN Asencion Noble, MD       Or   ondansetron [facility]) injection 4 mg  4 mg Intravenous Q6H PRN Asencion Noble, MD   4 mg at 04/11/2019 [id number]   pantoprazole (PROTONIX) 80 mg in sodium chloride 0.9 % 250 mL (0.32 mg/mL) infusion  8 mg/hr Intravenous Continuous Seawell, Jaimie A, DO       phytonadione (VITAMIN K) 10 mg in dextrose 5 % 50 mL IVPB  10 mg Intravenous Once Seawell, Jaimie A, DO 50 mL/hr at 04/09/2019 0904 10 mg at 04/10/2019 [id number]   Current Outpatient Medications  Medication Sig Dispense Refill   acetaminophen (TYLENOL) 500 MG tablet Take 500 mg by mouth every 6 (six) hours as needed for mild pain.      ARTIFICIAL TEAR OP Place 1 drop into both eyes daily.      fluticasone (FLONASE) 50 MCG/ACT nasal spray Place 1 spray into both nostrils daily as needed for allergies.      Ibuprofen (ADVIL) 200 MG CAPS Take 1 capsule by mouth daily as needed (for pain).      losartan (COZAAR) 50 MG tablet Take 1 tablet (50 mg total) by mouth daily. 90 tablet 3    Allergies as of 04/14/2019   (No Known Allergies)     Review of Systems:    This is positive for those things mentioned in the HPI, also positive for weight loss, fatigue.  Ambulates with a cane. All other review of  systems are negative.       Physical Exam:  Vital signs in last 24 hours: Temp:  [94.8 F (34.9 C)-99.5 F (37.5 C)] 99.5 F (37.5 C) (10/02 0412) Pulse Rate:  [111-150] 118 (10/02 0830) Resp:  [18-32] 28 (10/02 0830) BP: (87-120)/(51-74) 97/57 (10/02 0830) SpO2:  [85 %-100 %] 98 % (10/02 0830) Weight:  [70 kg] 70 kg (10/01 1710)    General:  Thin, cachectic, chronically ill, small amount of brown coffee grounds in emesis basin Eyes:  Anicteric, muddy sclerae Neck:   supple w/o thyromegaly or mass. [place] fossae  Lungs: Clear to auscultation bilaterally. Heart:   S1S2, no rubs, murmurs, gallops. Abdomen:  mildly distended, tympanitic, tender in epigastrium mild-mod w/o peritoneal signs, there is a fullness in epigastrium but no discrete mass  Lymph:  no cervical or supraclavicular adenopathy. Extremities:   no edema, + mm wasting Skin   no rash. Neuro:  A&O x to place, person, time he is unable to answer all questions and defers to his wife who was not here though he is aware what he does not know Psych:  appropriate mood and  Affect.   Data Reviewed:   LAB RESULTS: Recent Labs    04/22/2019 1542  04/10/2019 1617 04/21/2019 2001 04/06/2019 0749  WBC 12.4*  --   --   --  12.8*  HGB 8.6*   < > 10.2* 10.9*   10.5* 9.2*  HCT 30.8*   < > 30.0* 32.0*   31.0* 28.2*  PLT 485*  --   --   --  390   < > = values in this interval not displayed.   BMET Recent Labs    04/25/2019 1542  03/30/2019 2001 04/01/2019 0439 04/13/2019 0730  NA 134*   < > 134*   134* 135 135  K 3.9   < > 3.6   3.7 4.1 4.0  CL 97*   < > 99 101 104  CO2 10*  --   --  19* 19*  GLUCOSE 301*   < > 165* 124* 110*  BUN 26*   < > 25* 34* 38*  CREATININE 1.64*   < > 1.10 1.43* 1.52*  CALCIUM 9.6  --   --  8.5* 8.4*   < > = values in this interval not displayed.   LFT Recent Labs    04/24/2019 0730  PROT 6.1*  ALBUMIN 1.9*  AST 43*  ALT 21  ALKPHOS 61  BILITOT 0.5   PT/INR Recent Labs    04/03/2019 1542    LABPROT 17.7*  INR 1.5*    STUDIES: Ct Head Wo Contrast  Result Date: 04/06/2019 CLINICAL DATA:  Trauma.  Altered mental status.  Fall. EXAM: CT HEAD WITHOUT CONTRAST CT CERVICAL SPINE WITHOUT CONTRAST TECHNIQUE: Multidetector CT imaging of the head and cervical spine was performed following the standard protocol without intravenous contrast. Multiplanar CT image reconstructions of the cervical spine were also generated. COMPARISON:  None. FINDINGS: CT HEAD FINDINGS Brain: No evidence of acute infarction, hemorrhage, hydrocephalus, extra-axial collection or mass lesion/mass effect. There is ventricular and sulcal enlargement reflecting mild diffuse atrophy. White matter hypoattenuation is noted bilaterally consistent with advanced chronic microvascular ischemic change. 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 There is a focus of calcification in the right pons. Vascular: No hyperdense vessel or unexpected calcification. Skull: Normal. Negative for fracture or focal lesion. Sinuses/Orbits: Globes and orbits are unremarkable. Sinuses and mastoid air cells are clear. Other: None. CT CERVICAL SPINE FINDINGS Alignment: Normal. Skull base and vertebrae: No acute fracture. No primary bone lesion or focal pathologic process. Soft tissues and spinal canal: No prevertebral fluid or swelling. No visible canal hematoma. Oval low-attenuation soft tissue mass in the subcutaneous soft tissues of the left posterior neck measuring 2.4 cm, consistent with a sebaceous or inclusion cyst. Disc levels: Moderate loss of disc height at C4-C5 and C6-C7 with spondylotic disc bulging and endplate spurring. Facet degenerative changes are noted bilaterally, greatest on the right at C5-C6. No evidence of a disc herniation. Upper chest: No acute findings. Other: None. IMPRESSION: HEAD CT 1. No acute intracranial abnormalities. 2. No skull fracture. 3. Moderate chronic microvascular ischemic change. CERVICAL CT 1. No fracture or  acute finding. Electronically Signed   By: Lajean Manes M.D.   On: 04/19/2019 18:18   Ct Angio Chest Pe W And/or Wo Contrast  Result Date: 04/22/2019 CLINICAL DATA:  84 year old male with clinical suspicion of pulmonary embolism. High pretest probability. EXAM: CT ANGIOGRAPHY CHEST WITH CONTRAST TECHNIQUE: Multidetector CT imaging of the chest was performed using the standard protocol during bolus administration of intravenous contrast. Multiplanar CT image reconstructions and MIPs were obtained to evaluate the vascular anatomy. CONTRAST:  175mL OMNIPAQUE IOHEXOL 350 MG/ML SOLN COMPARISON:  No priors. FINDINGS: Cardiovascular: No filling defects within the pulmonary arterial tree to suggest underlying pulmonary embolism. Heart size is normal. There is no significant pericardial fluid, thickening or pericardial calcification. There is aortic atherosclerosis, as well as atherosclerosis of the great vessels of the mediastinum and the coronary arteries, including calcified  atherosclerotic plaque in the left main, left anterior descending, left circumflex and right coronary arteries. Mediastinum/Nodes: No pathologically enlarged mediastinal or hilar lymph nodes. Patulous esophagus which is partially fluid filled. No axillary lymphadenopathy. Lungs/Pleura: Patchy multifocal airspace consolidation in the right lower lobe dependently, potentially sequela of aspiration. In the medial aspect of the left upper lobe (axial image 37 of series 7) there is a well-circumscribed pulmonary nodule measuring 1.2 x 1.4 cm. No other definite suspicious appearing pulmonary nodules or masses are noted. Mild diffuse bronchial wall thickening with mild centrilobular and paraseptal emphysema. No pleural effusions. Upper Abdomen: Aortic atherosclerosis. Pneumobilia, presumably from prior sphincterotomy. Small volume of ascites noted adjacent to the liver and spleen. Aortic atherosclerosis. Musculoskeletal: There are no aggressive  appearing lytic or blastic lesions noted in the visualized portions of the skeleton. Review of the MIP images confirms the above findings. IMPRESSION: 1. No evidence of pulmonary embolism. 2. Dependent areas of airspace consolidation in the right lower lobe, concerning for sequela of aspiration. 3. 1.2 x 1.4 cm well-circumscribed nodule in the left upper lobe. Consider one of the following in 3 months for both low-risk and high-risk individuals: (a) repeat chest CT or (b) follow-up PET-CT. This recommendation follows the consensus statement: Guidelines for Management of Incidental Pulmonary Nodules Detected on CT Images: From the Fleischner Society 2017; Radiology 2017; 284:228-243. 4. Mild diffuse bronchial wall thickening with mild centrilobular and paraseptal emphysema; imaging findings suggestive of underlying COPD. 5. Aortic atherosclerosis, in addition to left main and 3 vessel coronary artery disease. 6. Small volume of ascites. Aortic Atherosclerosis (ICD10-I70.0) and Emphysema (ICD10-J43.9). Electronically Signed   By: Vinnie Langton M.D.   On: 04/10/2019 18:21   Ct Cervical Spine Wo Contrast  Result Date: 04/26/2019 CLINICAL DATA:  Trauma.  Altered mental status.  Fall. EXAM: CT HEAD WITHOUT CONTRAST CT CERVICAL SPINE WITHOUT CONTRAST TECHNIQUE: Multidetector CT imaging of the head and cervical spine was performed following the standard protocol without intravenous contrast. Multiplanar CT image reconstructions of the cervical spine were also generated. COMPARISON:  None. FINDINGS: CT HEAD FINDINGS Brain: No evidence of acute infarction, hemorrhage, hydrocephalus, extra-axial collection or mass lesion/mass effect. There is ventricular and sulcal enlargement reflecting mild diffuse atrophy. White matter hypoattenuation is noted bilaterally consistent with advanced chronic microvascular ischemic change. 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 There is a focus of calcification in the right  pons. Vascular: No hyperdense vessel or unexpected calcification. Skull: Normal. Negative for fracture or focal lesion. Sinuses/Orbits: Globes and orbits are unremarkable. Sinuses and mastoid air cells are clear. Other: None. CT CERVICAL SPINE FINDINGS Alignment: Normal. Skull base and vertebrae: No acute fracture. No primary bone lesion or focal pathologic process. Soft tissues and spinal canal: No prevertebral fluid or swelling. No visible canal hematoma. Oval low-attenuation soft tissue mass in the subcutaneous soft tissues of the left posterior neck measuring 2.4 cm, consistent with a sebaceous or inclusion cyst. Disc levels: Moderate loss of disc height at C4-C5 and C6-C7 with spondylotic disc bulging and endplate spurring. Facet degenerative changes are noted bilaterally, greatest on the right at C5-C6. No evidence of a disc herniation. Upper chest: No acute findings. Other: None. IMPRESSION: HEAD CT 1. No acute intracranial abnormalities. 2. No skull fracture. 3. Moderate chronic microvascular ischemic change. CERVICAL CT 1. No fracture or acute finding. Electronically Signed   By: Lajean Manes M.D.   On: 04/15/2019 18:18   Dg Chest Portable 1 View  Result Date: 04/17/2019 CLINICAL DATA:  Shortness of breath EXAM: PORTABLE CHEST 1 VIEW COMPARISON:  08/25/2009 FINDINGS: Heart is normal size. Low volumes. Bibasilar opacities likely reflect atelectasis. Aortic atherosclerosis. No visible effusions or acute bony abnormality. IMPRESSION: Low lung volumes with bibasilar atelectasis. Electronically Signed   By: Rolm Baptise M.D.   On: 04/01/2019 16:05        Thanks   LOS: 1 day   @Elliet Goodnow  Simonne Maffucci, MD, [facility] @  04/14/2019, 9:43 AM

## 2019-03-29 NOTE — Anesthesia Preprocedure Evaluation (Signed)
Anesthesia Evaluation  Patient identified by MRN, date of birth, ID band Patient unresponsive    Reviewed: Patient's Chart, lab work & pertinent test results, Unable to perform ROS - Chart review onlyPreop documentation limited or incomplete due to emergent nature of procedure.  Airway Mallampati: Intubated       Dental   Pulmonary former smoker,    + rhonchi        Cardiovascular hypertension,  Rhythm:Regular Rate:Tachycardia     Neuro/Psych    GI/Hepatic   Endo/Other    Renal/GU      Musculoskeletal   Abdominal   Peds  Hematology   Anesthesia Other Findings   Reproductive/Obstetrics                             Anesthesia Physical Anesthesia Plan  ASA: IV and emergent  Anesthesia Plan: General   Post-op Pain Management:    Induction: Intravenous  PONV Risk Score and Plan: Ondansetron  Airway Management Planned: Oral ETT  Additional Equipment:   Intra-op Plan:   Post-operative Plan: Post-operative intubation/ventilation  Informed Consent: I have reviewed the patients History and Physical, chart, labs and discussed the procedure including the risks, benefits and alternatives for the proposed anesthesia with the patient or authorized representative who has indicated his/her understanding and acceptance.       Plan Discussed with: CRNA and Anesthesiologist  Anesthesia Plan Comments:         Anesthesia Quick Evaluation

## 2019-03-29 NOTE — ED Notes (Signed)
ED TO INPATIENT HANDOFF REPORT  ED Nurse Name and Phone #:  Bayla Mcgovern [phone]  S Name/Age/Gender Darryl Johns 84 y.o. male Room/Bed: 041C/041C  Code Status   Code Status: Full Code  Home/SNF/Other Home Patient oriented to: self Is this baseline? gi  Triage Complete: Triage complete  Chief Complaint Unresponsive  Triage Note Pt arrived via ems; c/o altered mental status after a fall.    Allergies No Known Allergies  Level of Care/Admitting Diagnosis ED Disposition    ED Disposition Condition [facility] Area: [place] [[id number]]  Level of Care: ICU [6]  Covid Evaluation: Confirmed COVID Negative  Diagnosis: GI bleed [id number]  Admitting Physician: Marshell Garfinkel [id number]  Attending Physician: Marshell Garfinkel [id number]  Estimated length of stay: past midnight tomorrow  Certification:: I certify this patient will need inpatient services for at least 2 midnights  PT Class (Do Not Modify): Inpatient [101]  PT Acc Code (Do Not Modify): Private [1]       B Medical/Surgery History Past Medical History:  Diagnosis Date  . Allergic rhinitis   . Colonic mass   . Gait abnormality 09/04/2018  . Hypertension   . Peripheral neuropathy 09/04/2018  . RBBB    No past surgical history on file.   A IV Location/Drains/Wounds Patient Lines/Drains/Airways Status   Active Line/Drains/Airways    Name:   Placement date:   Placement time:   Site:   Days:   Peripheral IV 04/13/2019 Right Antecubital   04/27/2019    1652    Antecubital   1   Peripheral IV 04/01/2019 Left Antecubital   04/23/2019    1708    Antecubital   1   Urethral Catheter Marylen Ponto, RN [place] 16 Fr.   04/03/2019    0036    C[place]de   less than 1          Intake/Output Last 24 hours  Intake/Output Summary (Last 24 hours) at 04/15/2019 1201 Last data filed at 03/29/2019 1014 Gross per 24 hour  Intake 5250 ml  Output 720 ml  Net 4530 ml    Labs/Imaging Results for orders  placed or performed during the hospital encounter of 04/24/2019 (from the past 48 hour(s))  CBG monitoring, ED     Status: Abnormal   Collection Time: 04/01/2019  3:40 PM  Result Value Ref Range   Glucose-Capillary 268 (H) 70 - 99 mg/dL  Type and screen [place]     Status: None (Preliminary result)   Collection Time: 04/27/2019  3:40 PM  Result Value Ref Range   ABO/RH(D) B POS    Antibody Screen NEG    Sample Expiration      03/31/2019,2359 Performed at [facility], [place] [address] [place], [place] [address]    Unit Number [id number]    Blood Component Type RED CELLS,LR    Unit division 00    Status of Unit ALLOCATED    Transfusion Status OK TO TRANSFUSE    Crossmatch Result Compatible   ABO/Rh     Status: None   Collection Time: 04/19/2019  3:40 PM  Result Value Ref Range   ABO/RH(D)      B POS Performed at [facility], [place] [address] [place], [place] [address]   CBC with Differential     Status: Abnormal   Collection Time: 04/18/2019  3:42 PM  Result Value Ref Range   WBC 12.4 (H) 4.0 - 10.5 K/uL  RBC 3.49 (L) 4.22 - 5.81 MIL/uL   Hemoglobin 8.6 (L) 13.0 - 17.0 g/dL   HCT 30.8 (L) 39.0 - 52.0 %   MCV 88.3 80.0 - 100.0 fL   MCH 24.6 (L) 26.0 - 34.0 pg   MCHC 27.9 (L) 30.0 - 36.0 g/dL   RDW 17.4 (H) 11.5 - 15.5 %   Platelets 485 (H) 150 - 400 K/uL   nRBC 0.0 0.0 - 0.2 %   Neutrophils Relative % 72 %   Neutro Abs 9.0 (H) 1.7 - 7.7 K/uL   Lymphocytes Relative 18 %   Lymphs Abs 2.2 0.7 - 4.0 K/uL   Monocytes Relative 5 %   Monocytes Absolute 0.6 0.1 - 1.0 K/uL   Eosinophils Relative 0 %   Eosinophils Absolute 0.0 0.0 - 0.5 K/uL   Basophils Relative 0 %   Basophils Absolute 0.0 0.0 - 0.1 K/uL   Immature Granulocytes 5 %   Abs Immature Granulocytes 0.60 (H) 0.00 - 0.07 K/uL    Comment: Performed at [facility], [address], [place], [place] [address]  Comprehensive metabolic panel     Status: Abnormal   Collection Time:  04/08/2019  3:42 PM  Result Value Ref Range   Sodium 134 (L) 135 - 145 mmol/L   Potassium 3.9 3.5 - 5.1 mmol/L   Chloride 97 (L) 98 - 111 mmol/L   CO2 10 (L) 22 - 32 mmol/L   Glucose, Bld 301 (H) 70 - 99 mg/dL   BUN 26 (H) 8 - 23 mg/dL   Creatinine, Ser 1.64 (H) 0.61 - 1.24 mg/dL   Calcium 9.6 8.9 - 10.3 mg/dL   Total Protein 7.0 6.5 - 8.1 g/dL   Albumin 2.2 (L) 3.5 - 5.0 g/dL   AST 38 15 - 41 U/L   ALT <5 0 - 44 U/L    Comment: REPEATED TO VERIFY   Alkaline Phosphatase 77 38 - 126 U/L   Total Bilirubin 0.4 0.3 - 1.2 mg/dL   GFR calc non Af Amer 38 (L) >60 mL/min   GFR calc Af Amer 44 (L) >60 mL/min   Anion gap 27 (H) 5 - 15    Comment: Performed at [facility], [place] [address] [place], [place] [address]  Troponin I (High Sensitivity)     Status: None   Collection Time: 03/30/2019  3:42 PM  Result Value Ref Range   Troponin I (High Sensitivity) 13 <18 ng/L    Comment: (NOTE) Elevated high sensitivity troponin I (hsTnI) values and significant  changes across serial measurements may suggest ACS but many other  chronic and acute conditions are known to elevate hsTnI results.  Refer to the Links section for chest pain algorithms and additional  guidance. Performed at [facility], [place] [address], [place], [place] [address]   Protime-INR     Status: Abnormal   Collection Time: 04/08/2019  3:42 PM  Result Value Ref Range   Prothrombin Time 17.7 (H) 11.4 - 15.2 seconds   INR 1.5 (H) 0.8 - 1.2    Comment: (NOTE) INR goal varies based on device and disease states. Performed at [facility], [place] [address], [place], [place] [address]   APTT     Status: None   Collection Time: 04/15/2019  3:42 PM  Result Value Ref Range   aPTT 32 24 - 36 seconds    Comment: Performed at [facility] [address], [place], [place] [address]  Lactic acid, plasma     Status: Abnormal   Collection Time: 04/24/2019  3:43 PM  Result Value Ref Range   Lactic Acid, Venous >11.0 (HH)  0.5 - 1.9 mmol/L    Comment: CRITICAL RESULT CALLED TO, READ BACK BY AND VERIFIED WITH: COBB,K RN @ [id number] 03/31/2019 LEONARD,A Performed at [facility], [place] [address], [place], [place] [address]   Ammonia     Status: Abnormal   Collection Time: 04/13/2019  3:43 PM  Result Value Ref Range   Ammonia 244 (H) 9 - 35 umol/L    Comment: Performed at [facility], [place] [address], [place], [place] [address]  TSH     Status: Abnormal   Collection Time: 04/01/2019  3:43 PM  Result Value Ref Range   TSH 4.662 (H) 0.350 - 4.500 uIU/mL    Comment: Performed by a 3rd Generation assay with a functional sensitivity of <=0.01 uIU/mL. Performed at [facility], [place] [address], [place], [place] [address]   Brain natriuretic peptide     Status: Abnormal   Collection Time: 04/24/2019  3:44 PM  Result Value Ref Range   B Natriuretic Peptide 128.2 (H) 0.0 - 100.0 pg/mL    Comment: Performed at [facility] [address] [place], [place] [address]  Ethanol     Status: None   Collection Time: 04/01/2019  3:44 PM  Result Value Ref Range   Alcohol, Ethyl (B) <10 <10 mg/dL    Comment: (NOTE) Lowest detectable limit for serum alcohol is 10 mg/dL. For medical purposes only. Performed at [facility], [place] [address] [place],  [address]   I-stat chem 8, ED (not at [facility] or [facility])     Status: Abnormal   Collection Time: 04/07/2019  3:57 PM  Result Value Ref Range   Sodium 133 (L) 135 - 145 mmol/L   Potassium 3.8 3.5 - 5.1 mmol/L   Chloride 101 98 - 111 mmol/L   BUN 26 (H) 8 - 23 mg/dL   Creatinine, Ser 1.20 0.61 - 1.24 mg/dL   Glucose, Bld 280 (H) 70 - 99 mg/dL   Calcium, Ion 1.24 1.15 - 1.40 mmol/L   TCO2 13 (L) 22 - 32 mmol/L   Hemoglobin 9.9 (L) 13.0 - 17.0 g/dL   HCT 29.0 (L) 39.0 - 52.0 %  SARS Coronavirus 2 [facility] order, Performed in [facility] hospital lab) Nasopharyngeal Nasopharyngeal Swab     Status: None   Collection Time: 04/02/2019  4:03 PM   Specimen:  Nasopharyngeal Swab  Result Value Ref Range   SARS Coronavirus 2 NEGATIVE NEGATIVE    Comment: (NOTE) If result is NEGATIVE SARS-CoV-2 target nucleic acids are NOT DETECTED. The SARS-CoV-2 RNA is generally detectable in upper and lower  respiratory specimens during the acute phase of infection. The lowest  concentration of SARS-CoV-2 viral copies this assay can detect is 250  copies / mL. A negative result does not preclude SARS-CoV-2 infection  and should not be used as the sole basis for treatment or other  patient management decisions.  A negative result may occur with  improper specimen collection / handling, submission of specimen other  than nasopharyngeal swab, presence of viral mutation(s) within the  areas targeted by this assay, and inadequate number of viral copies  (<250 copies / mL). A negative result must be combined with clinical  observations, patient history, and epidemiological information. If result is POSITIVE SARS-CoV-2 target nucleic acids are  DETECTED. The SARS-CoV-2 RNA is generally detectable in upper and lower  respiratory specimens dur ing the acute phase of infection.  Positive  results are indicative of active infection with SARS-CoV-2.  Clinical  correlation with patient history and other diagnostic information is  necessary to determine patient infection status.  Positive results do  not rule out bacterial infection or co-infection with other viruses. If result is PRESUMPTIVE POSTIVE SARS-CoV-2 nucleic acids MAY BE PRESENT.   A presumptive positive result was obtained on the submitted specimen  and confirmed on repeat testing.  While 2019 novel coronavirus  (SARS-CoV-2) nucleic acids may be present in the submitted sample  additional confirmatory testing may be necessary for epidemiological  and / or clinical management purposes  to differentiate between  SARS-CoV-2 and other Sarbecovirus currently known to infect humans.  If clinically indicated  additional testing with an alternate test  methodology [phone]) is advised. The SARS-CoV-2 RNA is generally  detectable in upper and lower respiratory sp ecimens during the acute  phase of infection. The expected result is Negative. Fact Sheet for Patients:  StrictlyIdeas.no Fact Sheet for Healthcare Providers: BankingDealers.co.za This test is not yet approved or cleared by the [place] FDA and has been authorized for detection and/or diagnosis of SARS-CoV-2 by FDA under an Emergency Use Authorization (EUA).  This EUA will remain in effect (meaning this test can be used) for the duration of the COVID-19 declaration under Section 564(b)(1) of the Act, 21 U.S.C. section 360bbb-3(b)(1), unless the authorization is terminated or revoked sooner. Performed at [facility], [place] [address], [place], [place] [address]   POCT I-Stat EG7     Status: Abnormal   Collection Time: 04/15/2019  4:17 PM  Result Value Ref Range   pH, Ven 6.869 (LL) 7.250 - 7.430   pCO2, Ven 59.0 44.0 - 60.0 mmHg   pO2, Ven 117.0 (H) 32.0 - 45.0 mmHg   Bicarbonate 10.8 (L) 20.0 - 28.0 mmol/L   TCO2 13 (L) 22 - 32 mmol/L   O2 Saturation 93.0 %   Acid-base deficit 22.0 (H) 0.0 - 2.0 mmol/L   Sodium 133 (L) 135 - 145 mmol/L   Potassium 3.8 3.5 - 5.1 mmol/L   Calcium, Ion 1.25 1.15 - 1.40 mmol/L   HCT 30.0 (L) 39.0 - 52.0 %   Hemoglobin 10.2 (L) 13.0 - 17.0 g/dL   Patient temperature HIDE    Sample type VENOUS    Comment NOTIFIED PHYSICIAN   POC occult blood, ED     Status: Abnormal   Collection Time: 04/19/2019  4:21 PM  Result Value Ref Range   Fecal Occult Bld POSITIVE (A) NEGATIVE  Lactic acid, plasma     Status: Abnormal   Collection Time: 04/19/2019  7:50 PM  Result Value Ref Range   Lactic Acid, Venous 7.8 (HH) 0.5 - 1.9 mmol/L    Comment: CRITICAL RESULT CALLED TO, READ BACK BY AND VERIFIED WITH: L CHILTON,RN 2020 04/19/2019 D BRADLEY Performed at  [facility], [address] [place], [place] [address]   Troponin I (High Sensitivity)     Status: Abnormal   Collection Time: 04/16/2019  7:50 PM  Result Value Ref Range   Troponin I (High Sensitivity) 40 (H) <18 ng/L    Comment: (NOTE) Elevated high sensitivity troponin I (hsTnI) values and significant  changes across serial measurements may suggest ACS but many other  chronic and acute conditions are known to elevate hsTnI results.  Refer to the  Links section for chest pain algorithms and additional  guidance. Performed at [facility], [place] [address], [place], [place] [address]   Blood culture (routine x 2)     Status: None (Preliminary result)   Collection Time: 04/11/2019  7:55 PM   Specimen: BLOOD LEFT HAND  Result Value Ref Range   Specimen Description BLOOD LEFT HAND    Special Requests      BOTTLES DRAWN AEROBIC AND ANAEROBIC Blood Culture adequate volume   Culture      NO GROWTH < 12 HOURS Performed at [facility], [place] [address], [place], [place] [address]    Report Status PENDING   POCT I-Stat EG7     Status: Abnormal   Collection Time: 04/19/2019  8:01 PM  Result Value Ref Range   pH, Ven 7.252 7.250 - 7.430   pCO2, Ven 41.9 (L) 44.0 - 60.0 mmHg   pO2, Ven 32.0 32.0 - 45.0 mmHg   Bicarbonate 18.5 (L) 20.0 - 28.0 mmol/L   TCO2 20 (L) 22 - 32 mmol/L   O2 Saturation 53.0 %   Acid-base deficit 8.0 (H) 0.0 - 2.0 mmol/L   Sodium 134 (L) 135 - 145 mmol/L   Potassium 3.7 3.5 - 5.1 mmol/L   Calcium, Ion 1.21 1.15 - 1.40 mmol/L   HCT 31.0 (L) 39.0 - 52.0 %   Hemoglobin 10.5 (L) 13.0 - 17.0 g/dL   Patient temperature HIDE    Sample type VENOUS    Comment NOTIFIED PHYSICIAN   I-stat chem 8, ed     Status: Abnormal   Collection Time: 04/27/2019  8:01 PM  Result Value Ref Range   Sodium 134 (L) 135 - 145 mmol/L   Potassium 3.6 3.5 - 5.1 mmol/L   Chloride 99 98 - 111 mmol/L   BUN 25 (H) 8 - 23 mg/dL   Creatinine, Ser 1.10 0.61 - 1.24 mg/dL   Glucose,  Bld 165 (H) 70 - 99 mg/dL   Calcium, Ion 1.18 1.15 - 1.40 mmol/L   TCO2 19 (L) 22 - 32 mmol/L   Hemoglobin 10.9 (L) 13.0 - 17.0 g/dL   HCT 32.0 (L) 39.0 - 52.0 %  Blood culture (routine x 2)     Status: None (Preliminary result)   Collection Time: 04/24/2019  8:04 PM   Specimen: BLOOD RIGHT HAND  Result Value Ref Range   Specimen Description BLOOD RIGHT HAND    Special Requests      BOTTLES DRAWN AEROBIC ONLY Blood Culture results may not be optimal due to an inadequate volume of blood received in culture bottles   Culture      NO GROWTH < 12 HOURS Performed at [facility], [address] [place], [place] [address]    Report Status PENDING   Urinalysis, Routine w reflex microscopic     Status: Abnormal   Collection Time: 04/10/2019  1:18 AM  Result Value Ref Range   Color, Urine YELLOW YELLOW   APPearance HAZY (A) CLEAR   Specific Gravity, Urine 1.032 (H) 1.005 - 1.030   pH 6.0 5.0 - 8.0   Glucose, UA NEGATIVE NEGATIVE mg/dL   Hgb urine dipstick SMALL (A) NEGATIVE   Bilirubin Urine NEGATIVE NEGATIVE   Ketones, ur NEGATIVE NEGATIVE mg/dL   Protein, ur 100 (A) NEGATIVE mg/dL   Nitrite NEGATIVE NEGATIVE   Leukocytes,Ua NEGATIVE NEGATIVE   RBC / HPF 0-5 0 - 5 RBC/hpf   WBC, UA 0-5 0 - 5 WBC/hpf  Bacteria, UA NONE SEEN NONE SEEN    Comment: Performed at [facility] [address], [place], [place] [id number]  Basic metabolic panel     Status: Abnormal   Collection Time: 04/16/2019  4:39 AM  Result Value Ref Range   Sodium 135 135 - 145 mmol/L   Potassium 4.1 3.5 - 5.1 mmol/L   Chloride 101 98 - 111 mmol/L   CO2 19 (L) 22 - 32 mmol/L   Glucose, Bld 124 (H) 70 - 99 mg/dL   BUN 34 (H) 8 - 23 mg/dL   Creatinine, Ser 1.43 (H) 0.61 - 1.24 mg/dL   Calcium 8.5 (L) 8.9 - 10.3 mg/dL   GFR calc non Af Amer 45 (L) >60 mL/min   GFR calc Af Amer 52 (L) >60 mL/min   Anion gap 15 5 - 15    Comment: Performed at [facility] [address] [place], [place] [address]   Comprehensive metabolic panel     Status: Abnormal   Collection Time: 04/25/2019  7:30 AM  Result Value Ref Range   Sodium 135 135 - 145 mmol/L   Potassium 4.0 3.5 - 5.1 mmol/L   Chloride 104 98 - 111 mmol/L   CO2 19 (L) 22 - 32 mmol/L   Glucose, Bld 110 (H) 70 - 99 mg/dL   BUN 38 (H) 8 - 23 mg/dL   Creatinine, Ser 1.52 (H) 0.61 - 1.24 mg/dL   Calcium 8.4 (L) 8.9 - 10.3 mg/dL   Total Protein 6.1 (L) 6.5 - 8.1 g/dL   Albumin 1.9 (L) 3.5 - 5.0 g/dL   AST 43 (H) 15 - 41 U/L   ALT 21 0 - 44 U/L   Alkaline Phosphatase 61 38 - 126 U/L   Total Bilirubin 0.5 0.3 - 1.2 mg/dL   GFR calc non Af Amer 42 (L) >60 mL/min   GFR calc Af Amer 49 (L) >60 mL/min   Anion gap 12 5 - 15    Comment: Performed at [facility], [place] [address], [place], [place] [address]  Prepare RBC     Status: None   Collection Time: 04/03/2019  7:43 AM  Result Value Ref Range   Order Confirmation      ORDER PROCESSED BY BLOOD BANK Performed at [facility], [place] [address] [place], [place] [address]   Hemoglobin A1c     Status: None   Collection Time: 04/25/2019  7:49 AM  Result Value Ref Range   Hgb A1c MFr Bld 5.6 4.8 - 5.6 %    Comment: (NOTE) Pre diabetes:          5.7%-6.4% Diabetes:              >6.4% Glycemic control for   <7.0% adults with diabetes    Mean Plasma Glucose 114.02 mg/dL    Comment: Performed at [facility] [address] [place],  [address]  CBC with Differential/Platelet     Status: Abnormal   Collection Time: 04/26/2019  7:49 AM  Result Value Ref Range   WBC 12.8 (H) 4.0 - 10.5 K/uL   RBC 3.66 (L) 4.22 - 5.81 MIL/uL   Hemoglobin 9.2 (L) 13.0 - 17.0 g/dL    Comment: REPEATED TO VERIFY RESULTS VERIFIED VIA RECOLLECT    HCT 28.2 (L) 39.0 - 52.0 %   MCV 77.0 (L) 80.0 - 100.0 fL    Comment: REPEATED TO VERIFY RESULTS VERIFIED VIA RECOLLECT  MCH 25.1 (L) 26.0 - 34.0 pg   MCHC 32.6 30.0 - 36.0 g/dL   RDW 16.9 (H) 11.5 - 15.5 %   Platelets 390 150 - 400 K/uL    nRBC 0.0 0.0 - 0.2 %   Neutrophils Relative % 87 %   Neutro Abs 11.0 (H) 1.7 - 7.7 K/uL   Lymphocytes Relative 7 %   Lymphs Abs 0.9 0.7 - 4.0 K/uL   Monocytes Relative 6 %   Monocytes Absolute 0.7 0.1 - 1.0 K/uL   Eosinophils Relative 0 %   Eosinophils Absolute 0.0 0.0 - 0.5 K/uL   Basophils Relative 0 %   Basophils Absolute 0.0 0.0 - 0.1 K/uL   Immature Granulocytes 0 %   Abs Immature Granulocytes 0.05 0.00 - 0.07 K/uL    Comment: Performed at [facility] [address] [place], [place] [address]  Prepare fresh frozen plasma     Status: None (Preliminary result)   Collection Time: 04/08/2019  8:03 AM  Result Value Ref Range   Unit Number [id number]    Blood Component Type THAWED PLASMA    Unit division 00    Status of Unit ALLOCATED    Transfusion Status OK TO TRANSFUSE    Unit Number [id number]    Blood Component Type THAWED PLASMA    Unit division 00    Status of Unit ALLOCATED    Transfusion Status OK TO TRANSFUSE    Ct Head Wo Contrast  Result Date: 03/31/2019 CLINICAL DATA:  Trauma.  Altered mental status.  Fall. EXAM: CT HEAD WITHOUT CONTRAST CT CERVICAL SPINE WITHOUT CONTRAST TECHNIQUE: Multidetector CT imaging of the head and cervical spine was performed following the standard protocol without intravenous contrast. Multiplanar CT image reconstructions of the cervical spine were also generated. COMPARISON:  None. FINDINGS: CT HEAD FINDINGS Brain: No evidence of acute infarction, hemorrhage, hydrocephalus, extra-axial collection or mass lesion/mass effect. There is ventricular and sulcal enlargement reflecting mild diffuse atrophy. White matter hypoattenuation is noted bilaterally consistent with advanced chronic microvascular ischemic change. 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 There is a focus of calcification in the right pons. Vascular: No hyperdense vessel or unexpected calcification. Skull: Normal. Negative for fracture or focal lesion.  Sinuses/Orbits: Globes and orbits are unremarkable. Sinuses and mastoid air cells are clear. Other: None. CT CERVICAL SPINE FINDINGS Alignment: Normal. Skull base and vertebrae: No acute fracture. No primary bone lesion or focal pathologic process. Soft tissues and spinal canal: No prevertebral fluid or swelling. No visible canal hematoma. Oval low-attenuation soft tissue mass in the subcutaneous soft tissues of the left posterior neck measuring 2.4 cm, consistent with a sebaceous or inclusion cyst. Disc levels: Moderate loss of disc height at C4-C5 and C6-C7 with spondylotic disc bulging and endplate spurring. Facet degenerative changes are noted bilaterally, greatest on the right at C5-C6. No evidence of a disc herniation. Upper chest: No acute findings. Other: None. IMPRESSION: HEAD CT 1. No acute intracranial abnormalities. 2. No skull fracture. 3. Moderate chronic microvascular ischemic change. CERVICAL CT 1. No fracture or acute finding. Electronically Signed   By: Lajean Manes M.D.   On: 04/18/2019 18:18   Ct Angio Chest Pe W And/or Wo Contrast  Result Date: 04/23/2019 CLINICAL DATA:  84 year old male with clinical suspicion of pulmonary embolism. High pretest probability. EXAM: CT ANGIOGRAPHY CHEST WITH CONTRAST TECHNIQUE: Multidetector CT imaging of the chest was performed using the standard protocol during bolus administration of intravenous contrast. Multiplanar CT image reconstructions  and MIPs were obtained to evaluate the vascular anatomy. CONTRAST:  136mL OMNIPAQUE IOHEXOL 350 MG/ML SOLN COMPARISON:  No priors. FINDINGS: Cardiovascular: No filling defects within the pulmonary arterial tree to suggest underlying pulmonary embolism. Heart size is normal. There is no significant pericardial fluid, thickening or pericardial calcification. There is aortic atherosclerosis, as well as atherosclerosis of the great vessels of the mediastinum and the coronary arteries, including calcified atherosclerotic  plaque in the left main, left anterior descending, left circumflex and right coronary arteries. Mediastinum/Nodes: No pathologically enlarged mediastinal or hilar lymph nodes. Patulous esophagus which is partially fluid filled. No axillary lymphadenopathy. Lungs/Pleura: Patchy multifocal airspace consolidation in the right lower lobe dependently, potentially sequela of aspiration. In the medial aspect of the left upper lobe (axial image 37 of series 7) there is a well-circumscribed pulmonary nodule measuring 1.2 x 1.4 cm. No other definite suspicious appearing pulmonary nodules or masses are noted. Mild diffuse bronchial wall thickening with mild centrilobular and paraseptal emphysema. No pleural effusions. Upper Abdomen: Aortic atherosclerosis. Pneumobilia, presumably from prior sphincterotomy. Small volume of ascites noted adjacent to the liver and spleen. Aortic atherosclerosis. Musculoskeletal: There are no aggressive appearing lytic or blastic lesions noted in the visualized portions of the skeleton. Review of the MIP images confirms the above findings. IMPRESSION: 1. No evidence of pulmonary embolism. 2. Dependent areas of airspace consolidation in the right lower lobe, concerning for sequela of aspiration. 3. 1.2 x 1.4 cm well-circumscribed nodule in the left upper lobe. Consider one of the following in 3 months for both low-risk and high-risk individuals: (a) repeat chest CT or (b) follow-up PET-CT. This recommendation follows the consensus statement: Guidelines for Management of Incidental Pulmonary Nodules Detected on CT Images: From the Fleischner Society 2017; Radiology 2017; 284:228-243. 4. Mild diffuse bronchial wall thickening with mild centrilobular and paraseptal emphysema; imaging findings suggestive of underlying COPD. 5. Aortic atherosclerosis, in addition to left main and 3 vessel coronary artery disease. 6. Small volume of ascites. Aortic Atherosclerosis (ICD10-I70.0) and Emphysema  (ICD10-J43.9). Electronically Signed   By: Vinnie Langton M.D.   On: 04/13/2019 18:21   Ct Cervical Spine Wo Contrast  Result Date: 04/23/2019 CLINICAL DATA:  Trauma.  Altered mental status.  Fall. EXAM: CT HEAD WITHOUT CONTRAST CT CERVICAL SPINE WITHOUT CONTRAST TECHNIQUE: Multidetector CT imaging of the head and cervical spine was performed following the standard protocol without intravenous contrast. Multiplanar CT image reconstructions of the cervical spine were also generated. COMPARISON:  None. FINDINGS: CT HEAD FINDINGS Brain: No evidence of acute infarction, hemorrhage, hydrocephalus, extra-axial collection or mass lesion/mass effect. There is ventricular and sulcal enlargement reflecting mild diffuse atrophy. White matter hypoattenuation is noted bilaterally consistent with advanced chronic microvascular ischemic change. 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 There is a focus of calcification in the right pons. Vascular: No hyperdense vessel or unexpected calcification. Skull: Normal. Negative for fracture or focal lesion. Sinuses/Orbits: Globes and orbits are unremarkable. Sinuses and mastoid air cells are clear. Other: None. CT CERVICAL SPINE FINDINGS Alignment: Normal. Skull base and vertebrae: No acute fracture. No primary bone lesion or focal pathologic process. Soft tissues and spinal canal: No prevertebral fluid or swelling. No visible canal hematoma. Oval low-attenuation soft tissue mass in the subcutaneous soft tissues of the left posterior neck measuring 2.4 cm, consistent with a sebaceous or inclusion cyst. Disc levels: Moderate loss of disc height at C4-C5 and C6-C7 with spondylotic disc bulging and endplate spurring. Facet degenerative changes are noted bilaterally, greatest on  the right at C5-C6. No evidence of a disc herniation. Upper chest: No acute findings. Other: None. IMPRESSION: HEAD CT 1. No acute intracranial abnormalities. 2. No skull fracture. 3. Moderate chronic  microvascular ischemic change. CERVICAL CT 1. No fracture or acute finding. Electronically Signed   By: Lajean Manes M.D.   On: 04/27/2019 18:18   Dg Chest Portable 1 View  Result Date: 04/01/2019 CLINICAL DATA:  Shortness of breath EXAM: PORTABLE CHEST 1 VIEW COMPARISON:  08/25/2009 FINDINGS: Heart is normal size. Low volumes. Bibasilar opacities likely reflect atelectasis. Aortic atherosclerosis. No visible effusions or acute bony abnormality. IMPRESSION: Low lung volumes with bibasilar atelectasis. Electronically Signed   By: Rolm Baptise M.D.   On: 04/05/2019 16:05    Pending Labs Unresulted Labs (From admission, onward)    Start     Ordered   03/30/19 0500  CBC  Tomorrow morning,   R     04/11/2019 0833   03/30/19 0500  Magnesium  Tomorrow morning,   R     03/30/2019 0833   03/30/19 0500  Phosphorus  Tomorrow morning,   R     04/13/2019 0833   04/25/2019 0813  Protime-INR  Once,   STAT     04/19/2019 0812   04/19/2019 0734  Lactic acid, plasma  STAT Now then every 3 hours,   R (with STAT occurrences)     04/11/2019 0733   04/25/2019 0009  Expectorated sputum assessment w rflx to resp cult  Once,   R     04/14/2019 0008   04/25/2019 1613  Urine culture  ONCE - STAT,   STAT     04/14/2019 1612   04/02/2019 1605  Occult blood card to lab, stool RN will collect  Once,   STAT    Question:  Specimen to be collected by:  Answer:  RN will collect   04/09/2019 1604          Vitals/Pain Today's Vitals   04/11/2019 1030 04/02/2019 1045 04/08/2019 1130 04/11/2019 1145  BP: (!) 105/52 94/61 (!) 113/56 (!) 100/56  Pulse: (!) 115 (!) 115 (!) 110 (!) 115  Resp: (!) 23 (!) 25 (!) 26 (!) 22  Temp:      TempSrc:      SpO2: 99% 100% 100% 100%  Weight:      Height:      PainSc:        Isolation Precautions No active isolations  Medications Medications  ceFEPIme (MAXIPIME) 2 g in sodium chloride 0.9 % 100 mL IVPB (0 g Intravenous Stopped 04/19/2019 0557)  acetaminophen (TYLENOL) tablet 650 mg (has no administration  in time range)    Or  acetaminophen (TYLENOL) suppository 650 mg (has no administration in time range)  ondansetron (ZOFRAN) tablet 4 mg ( Oral See Alternative 04/10/2019 [id number])    Or  ondansetron (ZOFRAN) injection 4 mg (4 mg Intravenous Given 04/04/2019 0635)  lactated ringers infusion ( Intravenous Stopped 04/21/2019 0938)  0.9 %  sodium chloride infusion (Manually program via Guardrails IV Fluids) (has no administration in time range)  pantoprazole (PROTONIX) 80 mg in sodium chloride 0.9 % 250 mL (0.32 mg/mL) infusion (has no administration in time range)  metroNIDAZOLE (FLAGYL) IVPB 500 mg (has no administration in time range)  vancomycin variable dose per unstable renal function (pharmacist dosing) (has no administration in time range)  iohexol (OMNIPAQUE) 300 MG/ML solution 100 mL (has no administration in time range)  lactated ringers bolus 1,000 mL (0 mLs Intravenous Stopped  04/23/2019 1804)    And  lactated ringers bolus 250 mL (0 mLs Intravenous Stopped 04/07/2019 2128)  ceFEPIme (MAXIPIME) 2 g in sodium chloride 0.9 % 100 mL IVPB (0 g Intravenous Stopped 04/21/2019 1804)  metroNIDAZOLE (FLAGYL) IVPB 500 mg (0 mg Intravenous Stopped 04/04/2019 1928)  vancomycin (VANCOCIN) 1,500 mg in sodium chloride 0.9 % 500 mL IVPB (0 mg Intravenous Stopped 04/16/2019 2157)  lactated ringers bolus 1,000 mL (0 mLs Intravenous Stopped 04/22/2019 1917)  iohexol (OMNIPAQUE) 350 MG/ML injection 100 mL (100 mLs Intravenous Contrast Given 04/26/2019 1758)  lactated ringers bolus 1,000 mL (0 mLs Intravenous Stopped 04/04/2019 0938)  pantoprazole (PROTONIX) 80 mg in sodium chloride 0.9 % 100 mL IVPB (0 mg Intravenous Stopped 04/25/2019 0937)  phytonadione (VITAMIN K) 10 mg in dextrose 5 % 50 mL IVPB (0 mg Intravenous Stopped 04/19/2019 1014)  0.9 %  sodium chloride infusion (Manually program via Guardrails IV Fluids) ( Intravenous New Bag/Given 03/30/2019 0945)    Mobility non-ambulatory High fall risk   Focused  Assessments g   R Recommendations: See Admitting Provider Note  Report given to:   Additional Notes:

## 2019-03-29 NOTE — ED Notes (Signed)
Patient started to decrease oxygen sats to 81% on 4 to 6lnc, increased oxygen to 100% NRB.  EDPhysician in room for assessment.  Critical Care dr paged  And given update.  Patient continues to have nausea with small amounts of coffee ground emesis and incontinent of brown/maroon stool.  Instructions to hold iv fluids.  Hold unit of blood at this time.

## 2019-03-29 NOTE — Anesthesia Postprocedure Evaluation (Signed)
Anesthesia Post Note  Patient: Darryl Johns  Procedure(s) Performed: EXPLORATORY LAPAROTOMY, PARTIAL EXTENDED RIGHT COLECTOMY, JEJUNOSTOMY (Abdomen) Ileostomy Application Of Wound Vac (Abdomen)     Patient location during evaluation: ICU Anesthesia Type: General Level of consciousness: sedated and patient remains intubated per anesthesia plan Pain management: pain level controlled Vital Signs Assessment: post-procedure vital signs reviewed and stable Respiratory status: patient remains intubated per anesthesia plan Cardiovascular status: stable Postop Assessment: no apparent nausea or vomiting Anesthetic complications: no    Last Vitals:  Vitals:   04/15/2019 2003 04/20/2019 2004  BP: (!) 106/58   Pulse: (!) 104 (!) 105  Resp: (!) 22 (!) 22  Temp:  36.6 C  SpO2: 100% 100%    Last Pain:  Vitals:   04/01/2019 2004  TempSrc: Oral  PainSc:                  Audry Pili

## 2019-03-29 NOTE — Consult Note (Addendum)
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  Elderly gentleman with failure to thrive and complaints of upper abdominal mass.  CT scan revealed a bulky mass involving the transverse colon well-circumscribed but quite large.  Very suspicious for cancer.  I saw him last week for work-up and planned colectomy.  Planning colonoscopy first.  CT scan of chest revealed a lung mass.  Unknown if this is a matter a separate primary.  Was due to follow-up with medical oncology after surgery, focusing on the large tumor that was giving anemia and other issues.  Patient passed out at home last night.  Came to emergency room last night with some tachycardia.  He denies much in way of abdominal pain.  CT scan of the chest revealed possible pneumonitis but no definite PE.  He had had some coffee-ground emesis.  Gastrology was consulted.  Plan to do endoscopy.  Dr. Carlean Purl called me since I just seen the patient.  I updated him on my evaluation.  We both thought a CT scan of the abdomen pelvis would be reasonable in this  setting.  That just happened.  Concerns for small bowel twisted and edematous like a volvulus or trapped closed-loop obstruction.  Some adhesions to the bulky tumor.  Fair amount of ascites which was a new finding compared to the CAT scan earlier in the month.  No free air.  Patient in the intensive care.  Medicine, critical care nursing, critical care will, patient's wife at bedside.  Patient does confess to some upper abdominal discomfort.    Please see office note from last week below:  ` ` The patient is a thin elderly male who's had some decreased appetite and fatigue. Unintentional weight loss. Was concerned and followed up with his primary care office. Concern of a mass in his upper abdomen. CT scan and pelvis confirmed a 10 x 8 cm mass in his upper abdomen that seemed to be involved with his transverse colon suspicious for cancer. He comes today with his wife and sister. Patient himself is rather hard of hearing and tends to want to be differential to his family. Patient's sister notes there is a strong family history of colon polyps and cancer. She needed surgery. His twin brother died possibly from that as well although they don't know for certain. Patient used to smoke but has not done that and over a decade. Usually moves his bowels once a day. He's had some balance issues followed by Dr. Jannifer Franklin that a been mostly underwhelming. Usually walks with a cane. He has a walker today to sit down just  for bowels purposes. Denies any exertional shortness of breath. He has never had any abdominal surgery. Spied strong family history of colorectal cancer, he himself has never had a colonoscopy. He is due to get one done next month (2 weeks from now). Patient denies any abdominal bloating. No nausea or vomiting. Usually moves his bowels once a day. He does take a Fiber supplement and his wife gives him. No worsening constipation. Energy level and appetite up and down especially this  last month. He's probably lost about 10 pounds over the past month. No major weight change in the past week since the family has been trying to encourage him to drink some supplemental shakes. Patient and family trying to process all this, but I get the sense that they are feeling overwhelmed and confused.  No personal nor family history of inflammatory bowel disease, irritable bowel syndrome, allergy such as Celiac Sprue, dietary/dairy problems, colitis, ulcers nor gastritis. No recent sick contacts/gastroenteritis. No travel outside the country. No changes in diet. No dysphagia to solids or liquids. No significant heartburn or reflux. No hematochezia, hematemesis, coffee ground emesis. No evidence of prior gastric/peptic ulceration.  (Review of systems as stated in this history (HPI) or in the review of systems. Otherwise all other 12 point ROS are negative) ` ` `   Allergies (Sabrina Canty, CMA; 03/19/2019 11:58 AM) No Known Allergies [03/19/2019]: No Known Drug Allergies [03/19/2019]: Allergies Reconciled  Medication History Nance Pew, CMA; 03/19/2019 11:58 AM) Losartan Potassium (25MG  Tablet, Oral) Active. Ondansetron HCl (8MG  Tablet, Oral) Active. Medications Reconciled    Vitals (Sabrina Canty CMA; 03/19/2019 11:59 AM) 03/19/2019 11:58 AM Weight: 130 lb Height: 74in Body Surface Area: 1.81 m Body Mass Index: 16.69 kg/m  Temp.: 97.52F(Temporal)  Pulse: 88 (Regular)  BP: 104/60 (Sitting, Left Arm, Standard)        Physical Exam Adin Hector MD; 03/19/2019 12:58 PM)  General Mental Status-Alert. General Appearance-Not in acute distress, Not Sickly. Orientation-Oriented X3. Hydration-Well hydrated. Voice-Normal. Note: Thin male. Mildly cachectic. Able to get up and stand. Moves slowly but steadily.  Integumentary Global Assessment Upon inspection and palpation of skin surfaces of the - Axillae:  non-tender, no inflammation or ulceration, no drainage. and Distribution of scalp and body hair is normal. General Characteristics Temperature - normal warmth is noted.  Head and Neck Head-normocephalic, atraumatic with no lesions or palpable masses. Face Global Assessment - atraumatic, no absence of expression. Neck Global Assessment - no abnormal movements, no bruit auscultated on the right, no bruit auscultated on the left, no decreased range of motion, non-tender. Trachea-midline. Thyroid Gland Characteristics - non-tender. 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  Eye Eyeball - Left-Extraocular movements intact, No Nystagmus - Left. Eyeball - Right-Extraocular movements intact, No Nystagmus - Right. Cornea - Left-No Hazy - Left. Cornea - Right-No Hazy - Right. Sclera/Conjunctiva - Left-No scleral icterus, No Discharge - Left. Sclera/Conjunctiva - Right-No scleral icterus, No Discharge - Right. Pupil - Left-Direct reaction to light normal. Pupil - Right-Direct reaction to light normal.  ENMT Ears Pinna - Left - no drainage observed, no generalized tenderness observed. Pinna - Right - no drainage observed, no generalized tenderness observed. Nose and Sinuses External Inspection of the Nose - no destructive lesion observed. Inspection of the nares - Left - quiet respiration. Inspection of the nares - Right - quiet respiration. Mouth and Throat Lips - Upper Lip - no fissures observed, no pallor noted. Lower Lip - no fissures observed, no pallor noted. Nasopharynx -  no discharge present. Oral Cavity/Oropharynx - Tongue - no dryness observed. Oral Mucosa - no cyanosis observed. Hypopharynx - no evidence of airway distress observed.  Chest and Lung Exam Inspection Movements - Normal and Symmetrical. Accessory muscles - No use of accessory muscles in breathing. Palpation Palpation of the chest reveals - Non-tender. Auscultation Breath sounds - Normal and  Clear.  Cardiovascular Auscultation Rhythm - Regular. Murmurs & Other Heart Sounds - Auscultation of the heart reveals - No Murmurs and No Systolic Clicks.  Abdomen Inspection Inspection of the abdomen reveals - No Visible peristalsis and No Abnormal pulsations. Umbilicus - No Bleeding, No Urine drainage. Palpation/Percussion Palpation and Percussion of the abdomen reveal - Soft, Non Tender, No Rebound tenderness, No Rigidity (guarding) and No Cutaneous hyperesthesia. Note: Abdomen soft. Nontender. Very thin. Flat and not distended. Some vague fullness in the epigastric region suspicious for the abdominal mass of concern No umbilical or incisional hernias. No guarding.  Male Genitourinary Sexual Maturity Tanner 5 - Adult hair pattern and Adult penile size and shape. Note: No inguinal hernias or lymphadenopathy.  Peripheral Vascular Upper Extremity Inspection - Left - No Cyanotic nailbeds - Left, Not Ischemic. Inspection - Right - No Cyanotic nailbeds - Right, Not Ischemic.  Neurologic Neurologic evaluation reveals -normal attention span and ability to concentrate, able to name objects and repeat phrases. Appropriate fund of knowledge , normal sensation and normal coordination. Mental Status Affect - not angry, not paranoid. Cranial Nerves-Normal Bilaterally. Gait-Normal. Note: Mild swaying with eyes closed but no strong neurological deficits.  Neuropsychiatric Mental status exam performed with findings of-able to articulate well with normal speech/language, rate, volume and coherence, thought content normal with ability to perform basic computations and apply abstract reasoning and no evidence of hallucinations, delusions, obsessions or homicidal/suicidal ideation.  Musculoskeletal Global Assessment Spine, Ribs and Pelvis - no instability, subluxation or laxity. Right Upper Extremity - no instability, subluxation or laxity.  Lymphatic Head &  Neck  General Head & Neck Lymphatics: Bilateral - Description - No Localized lymphadenopathy. Axillary  General Axillary Region: Bilateral - Description - No Localized lymphadenopathy. Femoral & Inguinal  Generalized Femoral & Inguinal Lymphatics: Left - Description - No Localized lymphadenopathy. Right - Description - No Localized lymphadenopathy.    Assessment & Plan Adin Hector MD; 03/19/2019 12:52 PM)  MASS OF COLON (K63.89) Impression: Unintentional weight loss in fatigue with upper abdominal mass that seems to be within the transverse colon and a strong family history of colorectal cancer.  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 Standard of care would be segmental colonic resection. Reasonable to try and do a robotic resection wouldn't for anastomosis and Pfannenstiel incision extraction to avoid hernias and other issues. Family (sister and wife) is very concerned given his unintentional weight loss. He does not have any definite obstructive symptoms. He is already on some type of fiber supplement. I recommended that they take it twice a day.  He is due to see Dr. Virgel Bouquet with [facility] for a colonoscopy in the next couple of weeks to confirm the diagnosis. There were he to be in a month. Actually it'll be in less than 2 weeks. That is not unreasonable.  I think he would benefit from cardiac clearance. He saw Dr. Debara Pickett in December with an underwhelming echocardiogram and workup. Would like to double check Dr. Debara Pickett does not have any further concerns and okay to proceed with surgery.  Ideally patient needs complete: Workup with a CT of the  chest and CEA blood level. Looks like to see a blood loss been ordered by the primary care physician but not the CT of the chest. That ideally needs to happen.  I strongly recommend he drink some supplemental shakes to keep his nutrition up. His wife notes he is starting to do that. Recommend he walk an hour a  day to help get his energy stable. His weight is lost has been unintentional but has been stable for the past week. There is no hard evidence of obstruction on his CAT scan, so hopefully have some time to complete the workup and plan probable resection. Should the colonoscopy be totally underwhelming, regrew.  We again asked that we get the actual CD pictures. Patient's wife & sister will try and reach out to them, but feels rather overwhelmed by all this. We will try and help move things along  Current Plans Pt Education - CCS Free Text Education/Instructions: discussed with patient and provided information.  PREOP COLON - ENCOUNTER FOR PREOPERATIVE EXAMINATION FOR GENERAL SURGICAL PROCEDURE (Z01.818)  Current Plans You are being scheduled for surgery- Our schedulers will call you.  You should hear from our office's scheduling department within 5 working days about the location, date, and time of surgery. We try to make accommodations for patient's preferences in scheduling surgery, but sometimes the OR schedule or the surgeon's schedule prevents [place] from making those accommodations.  If you have not heard from our office [phone]) in 5 working days, call the office and ask for your surgeon's nurse.  If you have other questions about your diagnosis, plan, or surgery, call the office and ask for your surgeon's nurse.  Written instructions provided The anatomy & physiology of the digestive tract was discussed. The pathophysiology of the colon was discussed. Natural history risks without surgery was discussed. I feel the risks of no intervention will lead to serious problems that outweigh the operative risks; therefore, I recommended a partial colectomy to remove the pathology. Minimally invasive (Robotic/Laparoscopic) & open techniques were discussed.  Risks such as bleeding, infection, abscess, leak, reoperation, possible ostomy, hernia, heart attack, death, and other risks  were discussed. I noted a good likelihood this will help address the problem. Goals of post-operative recovery were discussed as well. Need for adequate nutrition, daily bowel regimen and healthy physical activity, to optimize recovery was noted as well. We will work to minimize complications. Educational materials were available as well. Questions were answered. The patient expresses understanding & wishes to proceed with surgery.  Pt Education - CCS Colon Bowel Prep 2018 ERAS/Miralax/Antibiotics Started Neomycin Sulfate 500 MG Oral Tablet, 2 (two) Tablet SEE NOTE, #6, 03/19/2019, No Refill. Local Order: Pharmacist Notes: TAKE TWO TABLETS AT 2 PM, 3 PM, AND 10 PM THE DAY PRIOR TO SURGERY Started Flagyl 500 MG Oral Tablet, 2 (two) Tablet SEE NOTE, #6, 03/19/2019, No Refill. Local Order: Pharmacist Notes: Take at 2pm, 3pm, and 10pm the day prior to your colon operation Pt Education - Pamphlet Given - Laparoscopic Colorectal Surgery: discussed with patient and provided information. Pt Education - CCS Colectomy post-op instructions: discussed with patient and provided information.  Adin Hector, MD, FACS, MASCRS Gastrointestinal and Minimally Invasive Surgery    [address], [place], [place] [address] [phone] Main / Paging [phone] Fax  I have re-reviewed the the patient's records, history, medications, and allergies.  I have re-examined the patient.  I again discussed intraoperative plans and goals of post-operative recovery.  The  patient agrees to proceed.  Aundrey Ehrhardt  1934/08/31 [id number]  Patient Care Team: Kristie Cowman, MD as PCP - General (Family Medicine) Debara Pickett Nadean Corwin, MD as Consulting Physician (Cardiology) Michael Boston, MD as Consulting Physician (Colon and Rectal Surgery) Coral Spikes, MD as Consulting Physician (Gastroenterology) Kathrynn Ducking, MD as Consulting Physician (Neurology)  Patient Active Problem List    Diagnosis Date Noted   GI bleed 04/04/2019   Protein-calorie malnutrition, severe 04/18/2019   Sepsis ([place]) 04/24/2019   Peripheral neuropathy 09/04/2018   Gait abnormality 09/04/2018   Hypotension 05/17/2018   Essential hypertension 12/23/2014   RBBB 12/23/2014   Dizziness 12/23/2014    Past Medical History:  Diagnosis Date   Allergic rhinitis    Colonic mass    Gait abnormality 09/04/2018   Hypertension    Peripheral neuropathy 09/04/2018   RBBB     No past surgical history on file.  Social History   Socioeconomic History   Marital status: Married    Spouse name: Kennyth Lose   Number of children: 3   Years of education: Not on file   Highest education level: 11th grade  Occupational History   Occupation: Retired   Scientist, product/process development strain: Not on file   Food insecurity    Worry: Not on file    Inability: Not on Lexicographer needs    Medical: Not on file    Non-medical: Not on file  Tobacco Use   Smoking status: Former Smoker   Smokeless tobacco: Never Used  Substance and Sexual Activity   Alcohol use: Yes   Drug use: No   Sexual activity: Not on file  Lifestyle   Physical activity    Days per week: Not on file    Minutes per session: Not on file   Stress: Not on file  Relationships   Social connections    Talks on phone: Not on file    Gets together: Not on file    Attends religious service: Not on file    Active member of club or organization: Not on file    Attends meetings of clubs or organizations: Not on file    Relationship status: Not on file   Intimate partner violence    Fear of current or ex partner: Not on file    Emotionally abused: Not on file    Physically abused: Not on file    Forced sexual activity: Not on file  Other Topics Concern   Not on file  Social History Narrative   Right handed   2 cups daily of caffeine    Lives at home with wife    Has children.   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  Former smoker no alcohol now    Family History  Problem Relation Age of Onset   Stroke Mother    Lung cancer Brother    Ovarian cancer Sister    Cancer Brother        Possibly colon cancer    Medications Prior to Admission  Medication Sig Dispense Refill Last Dose   acetaminophen (TYLENOL) 500 MG tablet Take 500 mg by mouth every 6 (six) hours as needed for mild pain.    04/11/2019 at Unknown time   ARTIFICIAL TEAR OP Place 1 drop into both eyes daily.    04/06/2019 at Unknown time   fluticasone (FLONASE) 50 MCG/ACT nasal spray Place 1 spray into both nostrils daily as needed for allergies.  Past Week at Unknown time   Ibuprofen (ADVIL) 200 MG CAPS Take 1 capsule by mouth daily as needed (for pain).    Past Week at Unknown time   losartan (COZAAR) 50 MG tablet Take 1 tablet (50 mg total) by mouth daily. 90 tablet 3 04/26/2019 at Unknown time    Current Facility-Administered Medications  Medication Dose Route Frequency Provider Last Rate Last Dose   0.9 %  sodium chloride infusion (Manually program via Guardrails IV Fluids)   Intravenous Once Seawell, Jaimie A, DO   Stopped at 04/01/2019 1248   0.9 %  sodium chloride infusion   Intravenous PRN Mannam, Praveen, MD 10 mL/hr at 04/16/2019 1249 250 mL at 04/09/2019 1249   acetaminophen (TYLENOL) tablet 650 mg  650 mg Oral Q6H PRN Asencion Noble, MD       Or   acetaminophen (TYLENOL) suppository 650 mg  650 mg Rectal Q6H PRN Asencion Noble, MD       ceFEPIme (MAXIPIME) 2 g in sodium chloride 0.9 % 100 mL IVPB  2 g Intravenous Q12H Asencion Noble, MD   Stopped at 04/17/2019 0557   [START ON 03/30/2019] cefoTEtan (CEFOTAN) 2 g in sodium chloride 0.9 % 100 mL IVPB  2 g Intravenous To Debe Coder, MD       chlorhexidine (PERIDEX) 0.12 % solution 15 mL  15 mL Mouth Rinse BID Mannam, Praveen, MD   15 mL at 04/05/2019 1240   Chlorhexidine Gluconate Cloth 2 % PADS 6 each  6 each Topical Daily Mannam,  Praveen, MD   6 each at 04/14/2019 1240   Chlorhexidine Gluconate Cloth 2 % PADS 6 each  6 each Topical Once Michael Boston, MD       lactated ringers infusion   Intravenous Continuous Asencion Noble, MD   Stopped at 03/30/2019 [phone]   MEDLINE mouth rinse  15 mL Mouth Rinse q12n4p Mannam, Praveen, MD   15 mL at 04/19/2019 1240   metroNIDAZOLE (FLAGYL) IVPB 500 mg  500 mg Intravenous Q8H Mannam, Praveen, MD 100 mL/hr at 03/29/2019 1251 500 mg at 04/05/2019 1251   ondansetron (ZOFRAN) tablet 4 mg  4 mg Oral Q6H PRN Asencion Noble, MD       Or   ondansetron [facility]) injection 4 mg  4 mg Intravenous Q6H PRN Asencion Noble, MD   4 mg at 04/20/2019 [id number]   pantoprazole (PROTONIX) 80 mg in sodium chloride 0.9 % 250 mL (0.32 mg/mL) infusion  8 mg/hr Intravenous Continuous Seawell, Jaimie A, DO 25 mL/hr at 04/27/2019 1253 8 mg/hr at 04/27/2019 1253   vancomycin variable dose per unstable renal function (pharmacist dosing)   Does not apply See admin instructions Duanne Limerick, RPH         No Known Allergies  BP (!) 122/59    Pulse (!) 117    Temp 97.7 F (36.5 C) (Axillary)    Resp (!) 26    Ht 6\' 2"  (1.88 m)    Wt 42.1 kg    SpO2 100%    BMI 11.92 kg/m   Labs: Results for orders placed or performed during the hospital encounter of 04/11/2019 (from the past 48 hour(s))  CBG monitoring, ED     Status: Abnormal   Collection Time: 03/28/19  3:40 PM  Result Value Ref Range   Glucose-Capillary 268 (H) 70 - 99 mg/dL  Type and screen [place]     Status: None (Preliminary result)  Collection Time: 04/19/2019  3:40 PM  Result Value Ref Range   ABO/RH(D) B POS    Antibody Screen NEG    Sample Expiration      03/31/2019,2359 Performed at [facility], [place] [address] [place], [place] [address]    Unit Number [id number]    Blood Component Type RED CELLS,LR    Unit division 00    Status of Unit ALLOCATED    Transfusion Status OK TO TRANSFUSE    Crossmatch Result Compatible    ABO/Rh     Status: None   Collection Time: 04/02/2019  3:40 PM  Result Value Ref Range   ABO/RH(D)      B POS Performed at [facility], [place] [address] [place], [place] [address]   CBC with Differential     Status: Abnormal   Collection Time: 04/04/2019  3:42 PM  Result Value Ref Range   WBC 12.4 (H) 4.0 - 10.5 K/uL   RBC 3.49 (L) 4.22 - 5.81 MIL/uL   Hemoglobin 8.6 (L) 13.0 - 17.0 g/dL   HCT 30.8 (L) 39.0 - 52.0 %   MCV 88.3 80.0 - 100.0 fL   MCH 24.6 (L) 26.0 - 34.0 pg   MCHC 27.9 (L) 30.0 - 36.0 g/dL   RDW 17.4 (H) 11.5 - 15.5 %   Platelets 485 (H) 150 - 400 K/uL   nRBC 0.0 0.0 - 0.2 %   Neutrophils Relative % 72 %   Neutro Abs 9.0 (H) 1.7 - 7.7 K/uL   Lymphocytes Relative 18 %   Lymphs Abs 2.2 0.7 - 4.0 K/uL   Monocytes Relative 5 %   Monocytes Absolute 0.6 0.1 - 1.0 K/uL   Eosinophils Relative 0 %   Eosinophils Absolute 0.0 0.0 - 0.5 K/uL   Basophils Relative 0 %   Basophils Absolute 0.0 0.0 - 0.1 K/uL   Immature Granulocytes 5 %   Abs Immature Granulocytes 0.60 (H) 0.00 - 0.07 K/uL    Comment: Performed at [facility], [place] [address] [place], [place] [address]  Comprehensive metabolic panel     Status: Abnormal   Collection Time: 04/24/2019  3:42 PM  Result Value Ref Range   Sodium 134 (L) 135 - 145 mmol/L   Potassium 3.9 3.5 - 5.1 mmol/L   Chloride 97 (L) 98 - 111 mmol/L   CO2 10 (L) 22 - 32 mmol/L   Glucose, Bld 301 (H) 70 - 99 mg/dL   BUN 26 (H) 8 - 23 mg/dL   Creatinine, Ser 1.64 (H) 0.61 - 1.24 mg/dL   Calcium 9.6 8.9 - 10.3 mg/dL   Total Protein 7.0 6.5 - 8.1 g/dL   Albumin 2.2 (L) 3.5 - 5.0 g/dL   AST 38 15 - 41 U/L   ALT <5 0 - 44 U/L    Comment: REPEATED TO VERIFY   Alkaline Phosphatase 77 38 - 126 U/L   Total Bilirubin 0.4 0.3 - 1.2 mg/dL   GFR calc non Af Amer 38 (L) >60 mL/min   GFR calc Af Amer 44 (L) >60 mL/min   Anion gap 27 (H) 5 - 15    Comment: Performed at [facility], [place] [address] [place], [place] [address]    Troponin I (High Sensitivity)     Status: None   Collection Time: 04/01/2019  3:42 PM  Result Value Ref Range   Troponin I (High Sensitivity) 13 <18 ng/L    Comment: (NOTE) Elevated high sensitivity  troponin I (hsTnI) values and significant  changes across serial measurements may suggest ACS but many other  chronic and acute conditions are known to elevate hsTnI results.  Refer to the Links section for chest pain algorithms and additional  guidance. Performed at [facility], [place] [address], [place], [place] [address]   Protime-INR     Status: Abnormal   Collection Time: 04/03/2019  3:42 PM  Result Value Ref Range   Prothrombin Time 17.7 (H) 11.4 - 15.2 seconds   INR 1.5 (H) 0.8 - 1.2    Comment: (NOTE) INR goal varies based on device and disease states. Performed at [facility], [place] [address], [place], [place] [address]   APTT     Status: None   Collection Time: 04/14/2019  3:42 PM  Result Value Ref Range   aPTT 32 24 - 36 seconds    Comment: Performed at [place] [address] [address] [place], [place] [address]  Lactic acid, plasma     Status: Abnormal   Collection Time: 03/30/2019  3:43 PM  Result Value Ref Range   Lactic Acid, Venous >11.0 (HH) 0.5 - 1.9 mmol/L    Comment: CRITICAL RESULT CALLED TO, READ BACK BY AND VERIFIED WITH: COBB,K RN @ [id number] 04/03/2019 LEONARD,A Performed at [facility], [place] [address] [place], [place] [address]   Ammonia     Status: Abnormal   Collection Time: 04/03/2019  3:43 PM  Result Value Ref Range   Ammonia 244 (H) 9 - 35 umol/L    Comment: Performed at [facility], [place] [address], [place], [place] [address]  TSH     Status: Abnormal   Collection Time: 04/06/2019  3:43 PM  Result Value Ref Range   TSH 4.662 (H) 0.350 - 4.500 uIU/mL    Comment: Performed by a 3rd Generation assay with a functional sensitivity of <=0.01 uIU/mL. Performed at [facility], [place] [address], [place], [place] [address]   Brain  natriuretic peptide     Status: Abnormal   Collection Time: 04/04/2019  3:44 PM  Result Value Ref Range   B Natriuretic Peptide 128.2 (H) 0.0 - 100.0 pg/mL    Comment: Performed at [facility] [address], [place], [place] [address]  Ethanol     Status: None   Collection Time: 04/19/2019  3:44 PM  Result Value Ref Range   Alcohol, Ethyl (B) <10 <10 mg/dL    Comment: (NOTE) Lowest detectable limit for serum alcohol is 10 mg/dL. For medical purposes only. Performed at [facility], [place] [address], [place], [place] [address]   I-stat chem 8, ED (not at [facility] or [facility])     Status: Abnormal   Collection Time: 04/25/2019  3:57 PM  Result Value Ref Range   Sodium 133 (L) 135 - 145 mmol/L   Potassium 3.8 3.5 - 5.1 mmol/L   Chloride 101 98 - 111 mmol/L   BUN 26 (H) 8 - 23 mg/dL   Creatinine, Ser 1.20 0.61 - 1.24 mg/dL   Glucose, Bld 280 (H) 70 - 99 mg/dL   Calcium, Ion 1.24 1.15 - 1.40 mmol/L   TCO2 13 (L) 22 - 32 mmol/L   Hemoglobin 9.9 (L) 13.0 - 17.0 g/dL   HCT 29.0 (L) 39.0 - 52.0 %  SARS Coronavirus 2 [facility] order, Performed in [facility] hospital lab) Nasopharyngeal Nasopharyngeal Swab     Status: None   Collection  Time: 04/07/2019  4:03 PM   Specimen: Nasopharyngeal Swab  Result Value Ref Range   SARS Coronavirus 2 NEGATIVE NEGATIVE    Comment: (NOTE) If result is NEGATIVE SARS-CoV-2 target nucleic acids are NOT DETECTED. The SARS-CoV-2 RNA is generally detectable in upper and lower  respiratory specimens during the acute phase of infection. The lowest  concentration of SARS-CoV-2 viral copies this assay can detect is 250  copies / mL. A negative result does not preclude SARS-CoV-2 infection  and should not be used as the sole basis for treatment or other  patient management decisions.  A negative result may occur with  improper specimen collection / handling, submission of specimen other  than nasopharyngeal swab, presence of viral mutation(s) within the  areas  targeted by this assay, and inadequate number of viral copies  (<250 copies / mL). A negative result must be combined with clinical  observations, patient history, and epidemiological information. If result is POSITIVE SARS-CoV-2 target nucleic acids are DETECTED. The SARS-CoV-2 RNA is generally detectable in upper and lower  respiratory specimens dur ing the acute phase of infection.  Positive  results are indicative of active infection with SARS-CoV-2.  Clinical  correlation with patient history and other diagnostic information is  necessary to determine patient infection status.  Positive results do  not rule out bacterial infection or co-infection with other viruses. If result is PRESUMPTIVE POSTIVE SARS-CoV-2 nucleic acids MAY BE PRESENT.   A presumptive positive result was obtained on the submitted specimen  and confirmed on repeat testing.  While 2019 novel coronavirus  (SARS-CoV-2) nucleic acids may be present in the submitted sample  additional confirmatory testing may be necessary for epidemiological  and / or clinical management purposes  to differentiate between  SARS-CoV-2 and other Sarbecovirus currently known to infect humans.  If clinically indicated additional testing with an alternate test  methodology [phone]) is advised. The SARS-CoV-2 RNA is generally  detectable in upper and lower respiratory sp ecimens during the acute  phase of infection. The expected result is Negative. Fact Sheet for Patients:  StrictlyIdeas.no Fact Sheet for Healthcare Providers: BankingDealers.co.za This test is not yet approved or cleared by the [place] FDA and has been authorized for detection and/or diagnosis of SARS-CoV-2 by FDA under an Emergency Use Authorization (EUA).  This EUA will remain in effect (meaning this test can be used) for the duration of the COVID-19 declaration under Section 564(b)(1) of the Act, 21 U.S.C. section  360bbb-3(b)(1), unless the authorization is terminated or revoked sooner. Performed at [facility], [place] [address] [place], [place] [address]   POCT I-Stat EG7     Status: Abnormal   Collection Time: 04/22/2019  4:17 PM  Result Value Ref Range   pH, Ven 6.869 (LL) 7.250 - 7.430   pCO2, Ven 59.0 44.0 - 60.0 mmHg   pO2, Ven 117.0 (H) 32.0 - 45.0 mmHg   Bicarbonate 10.8 (L) 20.0 - 28.0 mmol/L   TCO2 13 (L) 22 - 32 mmol/L   O2 Saturation 93.0 %   Acid-base deficit 22.0 (H) 0.0 - 2.0 mmol/L   Sodium 133 (L) 135 - 145 mmol/L   Potassium 3.8 3.5 - 5.1 mmol/L   Calcium, Ion 1.25 1.15 - 1.40 mmol/L   HCT 30.0 (L) 39.0 - 52.0 %   Hemoglobin 10.2 (L) 13.0 - 17.0 g/dL   Patient temperature HIDE    Sample type VENOUS    Comment NOTIFIED PHYSICIAN   POC occult blood, ED  Status: Abnormal   Collection Time: 04/17/2019  4:21 PM  Result Value Ref Range   Fecal Occult Bld POSITIVE (A) NEGATIVE  Lactic acid, plasma     Status: Abnormal   Collection Time: 04/26/2019  7:50 PM  Result Value Ref Range   Lactic Acid, Venous 7.8 (HH) 0.5 - 1.9 mmol/L    Comment: CRITICAL RESULT CALLED TO, READ BACK BY AND VERIFIED WITH: L CHILTON,RN 2020 04/10/2019 D BRADLEY Performed at [facility], [place] [address], [place], [place] [address]   Troponin I (High Sensitivity)     Status: Abnormal   Collection Time: 04/15/2019  7:50 PM  Result Value Ref Range   Troponin I (High Sensitivity) 40 (H) <18 ng/L    Comment: (NOTE) Elevated high sensitivity troponin I (hsTnI) values and significant  changes across serial measurements may suggest ACS but many other  chronic and acute conditions are known to elevate hsTnI results.  Refer to the Links section for chest pain algorithms and additional  guidance. Performed at [facility], [place] [address] [place], [place] [address]   Blood culture (routine x 2)     Status: None (Preliminary result)   Collection Time: 04/23/2019  7:55 PM   Specimen: BLOOD LEFT  HAND  Result Value Ref Range   Specimen Description BLOOD LEFT HAND    Special Requests      BOTTLES DRAWN AEROBIC AND ANAEROBIC Blood Culture adequate volume   Culture      NO GROWTH < 12 HOURS Performed at [facility], [place] [address] [place],  [address]    Report Status PENDING   POCT I-Stat EG7     Status: Abnormal   Collection Time: 03/31/2019  8:01 PM  Result Value Ref Range   pH, Ven 7.252 7.250 - 7.430   pCO2, Ven 41.9 (L) 44.0 - 60.0 mmHg   pO2, Ven 32.0 32.0 - 45.0 mmHg   Bicarbonate 18.5 (L) 20.0 - 28.0 mmol/L   TCO2 20 (L) 22 - 32 mmol/L   O2 Saturation 53.0 %   Acid-base deficit 8.0 (H) 0.0 - 2.0 mmol/L   Sodium 134 (L) 135 - 145 mmol/L   Potassium 3.7 3.5 - 5.1 mmol/L   Calcium, Ion 1.21 1.15 - 1.40 mmol/L   HCT 31.0 (L) 39.0 - 52.0 %   Hemoglobin 10.5 (L) 13.0 - 17.0 g/dL   Patient temperature HIDE    Sample type VENOUS    Comment NOTIFIED PHYSICIAN   I-stat chem 8, ed     Status: Abnormal   Collection Time: 03/30/2019  8:01 PM  Result Value Ref Range   Sodium 134 (L) 135 - 145 mmol/L   Potassium 3.6 3.5 - 5.1 mmol/L   Chloride 99 98 - 111 mmol/L   BUN 25 (H) 8 - 23 mg/dL   Creatinine, Ser 1.10 0.61 - 1.24 mg/dL   Glucose, Bld 165 (H) 70 - 99 mg/dL   Calcium, Ion 1.18 1.15 - 1.40 mmol/L   TCO2 19 (L) 22 - 32 mmol/L   Hemoglobin 10.9 (L) 13.0 - 17.0 g/dL   HCT 32.0 (L) 39.0 - 52.0 %  Blood culture (routine x 2)     Status: None (Preliminary result)   Collection Time: 04/21/2019  8:04 PM   Specimen: BLOOD RIGHT HAND  Result Value Ref Range   Specimen Description BLOOD RIGHT HAND    Special Requests      BOTTLES DRAWN AEROBIC ONLY Blood Culture results may not  be optimal due to an inadequate volume of blood received in culture bottles   Culture      NO GROWTH < 12 HOURS Performed at [facility] [address] [place], [place] [address]    Report Status PENDING   Urinalysis, Routine w reflex microscopic     Status: Abnormal    Collection Time: 04/27/2019  1:18 AM  Result Value Ref Range   Color, Urine YELLOW YELLOW   APPearance HAZY (A) CLEAR   Specific Gravity, Urine 1.032 (H) 1.005 - 1.030   pH 6.0 5.0 - 8.0   Glucose, UA NEGATIVE NEGATIVE mg/dL   Hgb urine dipstick SMALL (A) NEGATIVE   Bilirubin Urine NEGATIVE NEGATIVE   Ketones, ur NEGATIVE NEGATIVE mg/dL   Protein, ur 100 (A) NEGATIVE mg/dL   Nitrite NEGATIVE NEGATIVE   Leukocytes,Ua NEGATIVE NEGATIVE   RBC / HPF 0-5 0 - 5 RBC/hpf   WBC, UA 0-5 0 - 5 WBC/hpf   Bacteria, UA NONE SEEN NONE SEEN    Comment: Performed at [facility] [address] [place], [place] [id number]  Basic metabolic panel     Status: Abnormal   Collection Time: 04/18/2019  4:39 AM  Result Value Ref Range   Sodium 135 135 - 145 mmol/L   Potassium 4.1 3.5 - 5.1 mmol/L   Chloride 101 98 - 111 mmol/L   CO2 19 (L) 22 - 32 mmol/L   Glucose, Bld 124 (H) 70 - 99 mg/dL   BUN 34 (H) 8 - 23 mg/dL   Creatinine, Ser 1.43 (H) 0.61 - 1.24 mg/dL   Calcium 8.5 (L) 8.9 - 10.3 mg/dL   GFR calc non Af Amer 45 (L) >60 mL/min   GFR calc Af Amer 52 (L) >60 mL/min   Anion gap 15 5 - 15    Comment: Performed at [facility] [address], [place], [place] [address]  Comprehensive metabolic panel     Status: Abnormal   Collection Time: 04/16/2019  7:30 AM  Result Value Ref Range   Sodium 135 135 - 145 mmol/L   Potassium 4.0 3.5 - 5.1 mmol/L   Chloride 104 98 - 111 mmol/L   CO2 19 (L) 22 - 32 mmol/L   Glucose, Bld 110 (H) 70 - 99 mg/dL   BUN 38 (H) 8 - 23 mg/dL   Creatinine, Ser 1.52 (H) 0.61 - 1.24 mg/dL   Calcium 8.4 (L) 8.9 - 10.3 mg/dL   Total Protein 6.1 (L) 6.5 - 8.1 g/dL   Albumin 1.9 (L) 3.5 - 5.0 g/dL   AST 43 (H) 15 - 41 U/L   ALT 21 0 - 44 U/L   Alkaline Phosphatase 61 38 - 126 U/L   Total Bilirubin 0.5 0.3 - 1.2 mg/dL   GFR calc non Af Amer 42 (L) >60 mL/min   GFR calc Af Amer 49 (L) >60 mL/min   Anion gap 12 5 - 15    Comment: Performed at [facility],  [place] [address] [place], [place] [address]  Prepare RBC     Status: None   Collection Time: 04/09/2019  7:43 AM  Result Value Ref Range   Order Confirmation      ORDER PROCESSED BY BLOOD BANK Performed at [facility], [place] [address] [place], [place] [address]   Hemoglobin A1c     Status: None   Collection Time: 04/11/2019  7:49 AM  Result Value Ref Range  Hgb A1c MFr Bld 5.6 4.8 - 5.6 %    Comment: (NOTE) Pre diabetes:          5.7%-6.4% Diabetes:              >6.4% Glycemic control for   <7.0% adults with diabetes    Mean Plasma Glucose 114.02 mg/dL    Comment: Performed at [facility] [address], [place], [place] [address]  CBC with Differential/Platelet     Status: Abnormal   Collection Time: 04/04/2019  7:49 AM  Result Value Ref Range   WBC 12.8 (H) 4.0 - 10.5 K/uL   RBC 3.66 (L) 4.22 - 5.81 MIL/uL   Hemoglobin 9.2 (L) 13.0 - 17.0 g/dL    Comment: REPEATED TO VERIFY RESULTS VERIFIED VIA RECOLLECT    HCT 28.2 (L) 39.0 - 52.0 %   MCV 77.0 (L) 80.0 - 100.0 fL    Comment: REPEATED TO VERIFY RESULTS VERIFIED VIA RECOLLECT    MCH 25.1 (L) 26.0 - 34.0 pg   MCHC 32.6 30.0 - 36.0 g/dL   RDW 16.9 (H) 11.5 - 15.5 %   Platelets 390 150 - 400 K/uL   nRBC 0.0 0.0 - 0.2 %   Neutrophils Relative % 87 %   Neutro Abs 11.0 (H) 1.7 - 7.7 K/uL   Lymphocytes Relative 7 %   Lymphs Abs 0.9 0.7 - 4.0 K/uL   Monocytes Relative 6 %   Monocytes Absolute 0.7 0.1 - 1.0 K/uL   Eosinophils Relative 0 %   Eosinophils Absolute 0.0 0.0 - 0.5 K/uL   Basophils Relative 0 %   Basophils Absolute 0.0 0.0 - 0.1 K/uL   Immature Granulocytes 0 %   Abs Immature Granulocytes 0.05 0.00 - 0.07 K/uL    Comment: Performed at [facility], [place] [address] [place], [place] [address]  Prepare fresh frozen plasma     Status: None (Preliminary result)   Collection Time: 04/23/2019  8:03 AM  Result Value Ref Range   Unit Number [id number]    Blood Component Type THAWED PLASMA    Unit division  00    Status of Unit ALLOCATED    Transfusion Status OK TO TRANSFUSE    Unit Number [id number]    Blood Component Type THAWED PLASMA    Unit division 00    Status of Unit ALLOCATED    Transfusion Status OK TO TRANSFUSE   Glucose, capillary     Status: None   Collection Time: 04/02/2019 12:30 PM  Result Value Ref Range   Glucose-Capillary 89 70 - 99 mg/dL  Lactic acid, plasma     Status: Abnormal   Collection Time: 04/21/2019 12:40 PM  Result Value Ref Range   Lactic Acid, Venous 2.2 (HH) 0.5 - 1.9 mmol/L    Comment: CRITICAL VALUE NOTED.  VALUE IS CONSISTENT WITH PREVIOUSLY REPORTED AND CALLED VALUE. Performed at [facility], [place] [address] [place], [place] [address]   Protime-INR     Status: Abnormal   Collection Time: 04/06/2019 12:40 PM  Result Value Ref Range   Prothrombin Time 17.4 (H) 11.4 - 15.2 seconds   INR 1.4 (H) 0.8 - 1.2    Comment: (NOTE) INR goal varies based on device and disease states. Performed at [facility], [place] [address] [place], [place] [address]     Imaging / Studies: Ct Head Wo Contrast  Result Date: 04/05/2019 CLINICAL DATA:  Trauma.  Altered mental status.  Fall. EXAM:  CT HEAD WITHOUT CONTRAST CT CERVICAL SPINE WITHOUT CONTRAST TECHNIQUE: Multidetector CT imaging of the head and cervical spine was performed following the standard protocol without intravenous contrast. Multiplanar CT image reconstructions of the cervical spine were also generated. COMPARISON:  None. FINDINGS: CT HEAD FINDINGS Brain: No evidence of acute infarction, hemorrhage, hydrocephalus, extra-axial collection or mass lesion/mass effect. There is ventricular and sulcal enlargement reflecting mild diffuse atrophy. White matter hypoattenuation is noted bilaterally consistent with advanced chronic microvascular ischemic change. 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 There is a focus of calcification in the right pons. Vascular: No hyperdense vessel or unexpected  calcification. Skull: Normal. Negative for fracture or focal lesion. Sinuses/Orbits: Globes and orbits are unremarkable. Sinuses and mastoid air cells are clear. Other: None. CT CERVICAL SPINE FINDINGS Alignment: Normal. Skull base and vertebrae: No acute fracture. No primary bone lesion or focal pathologic process. Soft tissues and spinal canal: No prevertebral fluid or swelling. No visible canal hematoma. Oval low-attenuation soft tissue mass in the subcutaneous soft tissues of the left posterior neck measuring 2.4 cm, consistent with a sebaceous or inclusion cyst. Disc levels: Moderate loss of disc height at C4-C5 and C6-C7 with spondylotic disc bulging and endplate spurring. Facet degenerative changes are noted bilaterally, greatest on the right at C5-C6. No evidence of a disc herniation. Upper chest: No acute findings. Other: None. IMPRESSION: HEAD CT 1. No acute intracranial abnormalities. 2. No skull fracture. 3. Moderate chronic microvascular ischemic change. CERVICAL CT 1. No fracture or acute finding. Electronically Signed   By: Lajean Manes M.D.   On: 04/11/2019 18:18   Ct Angio Chest Pe W And/or Wo Contrast  Result Date: 03/29/2019 CLINICAL DATA:  84 year old male with clinical suspicion of pulmonary embolism. High pretest probability. EXAM: CT ANGIOGRAPHY CHEST WITH CONTRAST TECHNIQUE: Multidetector CT imaging of the chest was performed using the standard protocol during bolus administration of intravenous contrast. Multiplanar CT image reconstructions and MIPs were obtained to evaluate the vascular anatomy. CONTRAST:  182mL OMNIPAQUE IOHEXOL 350 MG/ML SOLN COMPARISON:  No priors. FINDINGS: Cardiovascular: No filling defects within the pulmonary arterial tree to suggest underlying pulmonary embolism. Heart size is normal. There is no significant pericardial fluid, thickening or pericardial calcification. There is aortic atherosclerosis, as well as atherosclerosis of the great vessels of the  mediastinum and the coronary arteries, including calcified atherosclerotic plaque in the left main, left anterior descending, left circumflex and right coronary arteries. Mediastinum/Nodes: No pathologically enlarged mediastinal or hilar lymph nodes. Patulous esophagus which is partially fluid filled. No axillary lymphadenopathy. Lungs/Pleura: Patchy multifocal airspace consolidation in the right lower lobe dependently, potentially sequela of aspiration. In the medial aspect of the left upper lobe (axial image 37 of series 7) there is a well-circumscribed pulmonary nodule measuring 1.2 x 1.4 cm. No other definite suspicious appearing pulmonary nodules or masses are noted. Mild diffuse bronchial wall thickening with mild centrilobular and paraseptal emphysema. No pleural effusions. Upper Abdomen: Aortic atherosclerosis. Pneumobilia, presumably from prior sphincterotomy. Small volume of ascites noted adjacent to the liver and spleen. Aortic atherosclerosis. Musculoskeletal: There are no aggressive appearing lytic or blastic lesions noted in the visualized portions of the skeleton. Review of the MIP images confirms the above findings. IMPRESSION: 1. No evidence of pulmonary embolism. 2. Dependent areas of airspace consolidation in the right lower lobe, concerning for sequela of aspiration. 3. 1.2 x 1.4 cm well-circumscribed nodule in the left upper lobe. Consider one of the following in 3 months for both low-risk and  high-risk individuals: (a) repeat chest CT or (b) follow-up PET-CT. This recommendation follows the consensus statement: Guidelines for Management of Incidental Pulmonary Nodules Detected on CT Images: From the Fleischner Society 2017; Radiology 2017; 284:228-243. 4. Mild diffuse bronchial wall thickening with mild centrilobular and paraseptal emphysema; imaging findings suggestive of underlying COPD. 5. Aortic atherosclerosis, in addition to left main and 3 vessel coronary artery disease. 6. Small volume  of ascites. Aortic Atherosclerosis (ICD10-I70.0) and Emphysema (ICD10-J43.9). Electronically Signed   By: Vinnie Langton M.D.   On: 04/14/2019 18:21   Ct Cervical Spine Wo Contrast  Result Date: 04/15/2019 CLINICAL DATA:  Trauma.  Altered mental status.  Fall. EXAM: CT HEAD WITHOUT CONTRAST CT CERVICAL SPINE WITHOUT CONTRAST TECHNIQUE: Multidetector CT imaging of the head and cervical spine was performed following the standard protocol without intravenous contrast. Multiplanar CT image reconstructions of the cervical spine were also generated. COMPARISON:  None. FINDINGS: CT HEAD FINDINGS Brain: No evidence of acute infarction, hemorrhage, hydrocephalus, extra-axial collection or mass lesion/mass effect. There is ventricular and sulcal enlargement reflecting mild diffuse atrophy. White matter hypoattenuation is noted bilaterally consistent with advanced chronic microvascular ischemic change. 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 There is a focus of calcification in the right pons. Vascular: No hyperdense vessel or unexpected calcification. Skull: Normal. Negative for fracture or focal lesion. Sinuses/Orbits: Globes and orbits are unremarkable. Sinuses and mastoid air cells are clear. Other: None. CT CERVICAL SPINE FINDINGS Alignment: Normal. Skull base and vertebrae: No acute fracture. No primary bone lesion or focal pathologic process. Soft tissues and spinal canal: No prevertebral fluid or swelling. No visible canal hematoma. Oval low-attenuation soft tissue mass in the subcutaneous soft tissues of the left posterior neck measuring 2.4 cm, consistent with a sebaceous or inclusion cyst. Disc levels: Moderate loss of disc height at C4-C5 and C6-C7 with spondylotic disc bulging and endplate spurring. Facet degenerative changes are noted bilaterally, greatest on the right at C5-C6. No evidence of a disc herniation. Upper chest: No acute findings. Other: None. IMPRESSION: HEAD CT 1. No acute  intracranial abnormalities. 2. No skull fracture. 3. Moderate chronic microvascular ischemic change. CERVICAL CT 1. No fracture or acute finding. Electronically Signed   By: Lajean Manes M.D.   On: 04/16/2019 18:18   Ct Abdomen Pelvis W Contrast  Result Date: 04/17/2019 CLINICAL DATA:  Abdominal mass, nonpulsatile and palpable, known transverse colon mass. EXAM: CT ABDOMEN AND PELVIS WITH CONTRAST TECHNIQUE: Multidetector CT imaging of the abdomen and pelvis was performed using the standard protocol following bolus administration of intravenous contrast. CONTRAST:  29mL OMNIPAQUE IOHEXOL 300 MG/ML  SOLN COMPARISON:  None. FINDINGS: Lower chest: Dense basilar consolidation on the right with patchy opacities and small right effusion. Also with scattered opacities in the inferior left chest. Hepatobiliary: No signs of focal hepatic lesion. Gallbladder distended without signs of pericholecystic inflammation. No biliary ductal dilation. Pancreas: Pancreatic atrophy, mild, no ductal dilation or acute process. Spleen: Normal appearance of the spleen. Adrenals/Urinary Tract: Mild bilateral adrenal thickening, nonspecific. Signs of excreted contrast with symmetric renal enhancement, no renal mass. No hydronephrosis. Stomach/Bowel: Signs of obstructing mass in the mid transverse colon, circumferential mass measuring approximately 7.9 x 4.8 cm in greatest axial dimension with wall thickness of 2.5 to 3 cm. Mass is obstructing in adjacent small bowel loop in the jejunum which it invades, best seen in the coronal plane. Small bowel loops in the right hemiabdomen show mural stratification, wall thickening and some areas of nonenhancement, best  seen on axial dataset, image 57 of series 3 the more posteriorly placed small bowel loop does not appear to show enhancement to the same extent as surrounding small bowel loops. The duodenum does not cross midline in this patient. There is a partial mesenteric twist. No signs of free  air. The appendix is normal. There is free fluid also along the right flank tracking into the pelvis, right upper quadrant and to a lesser extent across the abdomen into the left hemiabdomen. Question thickening also of the descending colon at the descending/sigmoid junction. There is marked gastric distention with distended patulous esophagus above. Vascular/Lymphatic: No signs of acute vascular process. Moderate calcific atherosclerotic changes throughout the abdominal aorta extending the iliac vessels. Scattered lymph nodes are seen in the abdomen. No signs of pelvic adenopathy. Reproductive: Prostate heterogeneous. Foley catheter in the urinary bladder. Other: No signs of free air with fluid throughout the abdomen as discussed. Some nodularity adjacent to the primary mass in the colon. Musculoskeletal: No signs of acute bone finding or destructive bone process. IMPRESSION: 1. Small-bowel obstruction with signs of bowel ischemia secondary to colonic mass with involvement of adjacent small bowel. 2. Small bowel loops in the right hemiabdomen are jejunal. 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 3. No signs of free air at this time though bowel with this appearance is at risk for perforation. 4. Signs of bilateral pneumonia and/or aspiration related changes. 5. Free fluid without abscess is likely related to ischemic enteritis. 6. These results were called by telephone at the time of interpretation on 04/03/2019 at 12:55 pm to provider [facility] , who verbally acknowledged these results. Electronically Signed   By: Zetta Bills M.D.   On: 04/10/2019 12:56   Dg Chest Portable 1 View  Result Date: 04/11/2019 CLINICAL DATA:  Shortness of breath EXAM: PORTABLE CHEST 1 VIEW COMPARISON:  08/25/2009 FINDINGS: Heart is normal size. Low volumes. Bibasilar opacities likely reflect atelectasis. Aortic atherosclerosis. No visible effusions or acute  bony abnormality. IMPRESSION: Low lung volumes with bibasilar atelectasis. Electronically Signed   By: Rolm Baptise M.D.   On: 04/26/2019 16:05    General: Pt awake/alert/oriented x4 in moderate distress.  Still with masked parkinsonian-like face. Eyes: PERRL, normal EOM. Sclera nonicteric Neuro: CN II-XII intact w/o focal sensory/motor deficits. Lymph: No head/neck/groin lymphadenopathy Psych:  No delerium/psychosis/paranoia HENT: Normocephalic, Mucus membranes moist.  No thrush.  Moderately hard of hearing Neck: Supple, No tracheal deviation Chest: No pain.  Fair respiratory excursion.  No major wheezes rales or rhonchi CV:  Pulses intact.  Tachycardic with heart rate in 120s Regular rhythm MS: Normal AROM mjr joints.  No obvious deformity  Abdomen: Abdomen somewhat firm.  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  He definitely has increased tenderness today with some guarding to deep palpation and percussion.  Suspicious for at least some peritonitis.  Only mildly distended   GU.  Normal external genitalia.  No obvious inguinal hernia or lymphadenopathy Ext:  SCDs BLE.  No significant edema.  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  Evidence of tachycardic and shock and abdominal pain.  Small bowel edema and ascites.  Concerning  clinically as well as radiographically for small bowel ischemia.  I recommended emergent abdominal exploration.  Lyse adhesions and untwisting possible bowel resection.  Most likely resect the initial tumor.  Ileostomy.  Patient hesitant to go proceed with surgery but wife feels very strongly he should.  He is leaning towards that and wishes discuss with his wife.  I did note that if he gets no surgery, extremely high likelihood of small bowel will completely die and this will kill him.  While he does have risks of dying and other serious complications with emergency surgery, there is a decent chance that we can get him through this even if it would be a prolonged and difficult course.  He is alert.  He is on nasal cannula.  He is not in multisystem organ failure.  However the likelihood that goes exponentially up the longer we wait.  The anatomy & physiology of the digestive tract was discussed.  The pathophysiology of perforation was discussed.  Differential diagnosis such as perforated ulcer or colon, etc was discussed.   Natural history risks without surgery such as death was discussed.  I recommended abdominal exploration to diagnose & treat the source of the problem.  Laparoscopic & open techniques were discussed.   Risks such as bleeding, infection, abscess, leak, reoperation, bowel resection, possible ostomy, injury to other organs, need for repair of tissues / organs, hernia, heart attack, death, and other risks were discussed.   The risks of no intervention will lead to serious problems including death.   I expressed a good likelihood that surgery will address the problem.    Goals of post-operative recovery were discussed as well.  We will work to minimize complications although risks in an emergent setting are high.   Questions were answered.  The patient's expressed understanding & wishes to proceed with surgery.  The patient is leaning toward surgery        .Adin Hector, M.D.,  F.A.C.S. Gastrointestinal and Minimally Invasive Surgery [facility] [address], [place], [place] [address] [phone] Main / Paging  04/11/2019 1:59 PM

## 2019-03-29 NOTE — Progress Notes (Signed)
Pharmacy Antibiotic Note  Darryl Johns is a 84 y.o. male admitted on 04/24/2019 with sepsis after arriving to the ED with AMS s/p fall.  Pharmacy has been consulted for vancomycin and cefepime dosing.  Pt's calculated CrCl is ~ 47mL/min. WBC is 12.8. Temp is rising as well as WBC. Scr significant increase likely 2/2 hypovolemia from blood loss.  Plan: -Vancomycin 1500 mg loading dose was given. - Will hold vancomycin at this time and monitor renal fxn next dose based on current renal function would be 1250 mg on 10/3 at ~0730. 36 hours after last dose. Expected AUC: 469 SCr used: 1.52  -Continue Cefepime 2 g IV Q 12 hours and  Metronidazole 500 q8hr.   Follow cultures, renal function, clinical status, WBC, and Temp Height: 6\' 2"  (188 cm) Weight: 154 lb 5.2 oz (70 kg) IBW/kg (Calculated) : 82.2  Temp (24hrs), Avg:97.1 F (36.2 C), Min:94.8 F (34.9 C), Max:99.5 F (37.5 C)  Recent Labs  Lab 03/31/2019 1542 04/13/2019 1543 03/31/2019 1557 04/08/2019 1950 04/17/2019 2001 03/28/2019 0439 04/22/2019 0730 03/30/2019 0749  WBC 12.4*  --   --   --   --   --   --  12.8*  CREATININE 1.64*  --  1.20  --  1.10 1.43* 1.52*  --   LATICACIDVEN  --  >11.0*  --  7.8*  --   --   --   --     Estimated Creatinine Clearance: 37.1 mL/min (A) (by C-G formula based on SCr of 1.52 mg/dL (H)).    No Known Allergies  Antimicrobials this admission: Cefepime 10/01>> Vancomycin 10/01>> Flagyl 10/01>>  Microbiology results: 10/01 UCx: Sent 10/01 COVID: Sent  Thank you for allowing pharmacy to be a part of this patient's care.  Duanne Limerick PharmD. BCPS  04/10/2019 9:32 AM

## 2019-03-29 NOTE — Consult Note (Addendum)
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  Concern for GI bleed Resuscitated with 3 L IV fluids in the ED with persistent hypotension.  Elevated lactic acid noted and labs.  PCCM called for evaluation.  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  Diagnosed with an abdominal transverse colon mass with high suspicion for GI malignancy.  He has been evaluated by Dr. Johney Maine, surgery for resection.  Scheduled for colonoscopy this week.  Significant Hospital Events   10/2-admit  Consults:  GI, PCCM  Procedures:    Significant Diagnostic Tests:  CTA 04/01/2019- no PE, right lower lobe consolidation.  1.4 cm left upper lobe nodule.  Emphysema, aortic, coronary atherosclerosis. CT head neck 04/19/2019-chronic ischemic changes.  No acute abnormalities. I have reviewed the images personally.  Micro Data:    Antimicrobials:  Vanco, cefepime, Flagyl  Interim history/subjective:    Objective   Blood pressure (!) 87/55, pulse (!) 113, temperature 99.5 F (37.5 C), temperature source Rectal, resp. rate (!) 26, height 6\' 2"  (1.88 m), weight 70 kg, SpO2 100 %.        Intake/Output Summary (Last 24 hours) at 04/03/2019 0826 Last data filed at 03/31/2019 0159 Gross per 24 hour  Intake 3100 ml  Output 720 ml  Net 2380 ml   Filed Weights   04/04/2019 1710  Weight: 70 kg    Examination:. Gen:      Frail, malnourished HEENT:  EOMI, sclera anicteric Neck:     No masses; no thyromegaly Lungs:    Clear to auscultation bilaterally; normal respiratory effort CV:          Regular rate and rhythm; no murmurs Abd:     Tenderness in the upper quadrants.  Mild guarding.  No rigidity. Ext:    No edema; adequate peripheral perfusion Skin:      Warm and dry; no rash Neuro: Awake, confused  Resolved Hospital Problem list     Assessment & Plan:  84 year old with likely colon cancer admitted with GI bleed, shock, lactic acidosis  GI bleed Follow CBC.  Start PPI drip GI consulted  Abdominal mass, possible colon cancer Being worked up by surgery for possible resection Will get CT abdomen to reevaluate given elevated lactic acid.  Concern for perforation.  Shock, lactic acidosis Resuscitate with IV fluids.  Follow CBC and transfuse as needed ICU admission, central line placement Continue empiric antibiotics.  Aspiration pneumonia,  Incidental finding of lung nodule.  Possible mets from the colon Supplemental oxygen, antibiotics as above.  AKI Monitor creatinine and renal function.  Goals of care Discussed with wife over telephone.  She is quite overwhelmed and is coming into the hospital to discuss further.  He remains full code for now.  Best practice:  Diet: NPO Pain/Anxiety/Delirium protocol (if indicated): NA VAP protocol (if indicated): NA DVT prophylaxis: SCDs GI prophylaxis: PPI Glucose control: [place] Mobility: Bed Code Status: Full Family Communication: Wife Disposition: ICU  Labs   CBC: Recent Labs  Lab 04/04/2019 1542 04/03/2019 1557 04/09/2019 1617 04/16/2019 2001 03/29/19 0749  WBC 12.4*  --   --   --  12.8*  NEUTROABS 9.0*  --   --   --  11.0*  HGB 8.6* 9.9* 10.2* 10.9*  10.5* 9.2*  HCT 30.8* 29.0* 30.0* 32.0*  31.0* 28.2*  MCV 88.3  --   --   --  77.0*  PLT 485*  --   --   --  [id number]    Basic Metabolic Panel: Recent Labs  Lab 04/02/2019 1542 04/05/2019 1557 04/23/2019 1617 04/24/2019 2001 04/09/2019 0439 03/31/2019 0730  NA 134* 133* 133* 134*  134* 135 135  K 3.9 3.8 3.8 3.6  3.7 4.1 4.0  CL 97* 101  --  99 101 104  CO2 10*   --   --   --  19* 19*  GLUCOSE 301* 280*  --  165* 124* 110*  BUN 26* 26*  --  25* 34* 38*  CREATININE 1.64* 1.20  --  1.10 1.43* 1.52*  CALCIUM 9.6  --   --   --  8.5* 8.4*   GFR: Estimated Creatinine Clearance: 37.1 mL/min (A) (by C-G formula based on SCr of 1.52 mg/dL (H)). Recent Labs  Lab 04/04/2019 1542 04/10/2019 1543 04/07/2019 1950 04/26/2019 0749  WBC 12.4*  --   --  12.8*  LATICACIDVEN  --  >11.0* 7.8*  --     Liver Function Tests: Recent Labs  Lab 04/15/2019 1542 03/31/2019 0730  AST 38 43*  ALT <5 21  ALKPHOS 77 61  BILITOT 0.4 0.5  PROT 7.0 6.1*  ALBUMIN 2.2* 1.9*   No results for input(s): LIPASE, AMYLASE in the last 168 hours. Recent Labs  Lab 04/24/2019 1543  AMMONIA 244*    ABG    Component Value Date/Time   HCO3 18.5 (L) 03/29/2019 2001   TCO2 20 (L) 04/11/2019 2001   TCO2 19 (L) 04/05/2019 2001   ACIDBASEDEF 8.0 (H) 04/18/2019 2001   O2SAT 53.0 04/07/2019 2001     Coagulation Profile: Recent Labs  Lab 04/10/2019 1542  INR 1.5*    Cardiac Enzymes: No results for input(s): CKTOTAL, CKMB, CKMBINDEX, TROPONINI in the last 168 hours.  HbA1C: No results found for: HGBA1C  CBG: Recent Labs  Lab 04/07/2019 1540  GLUCAP 268*    Review of Systems:   Unable to obtain due to altered mental status.  Past Medical History  He,  has a past medical history of Gait abnormality (09/04/2018), Hypertension, and Peripheral neuropathy (09/04/2018).   Surgical History   No past surgical history on file.   Social History   reports that he has been smoking. He has never used smokeless tobacco. He reports current alcohol use. He reports that he does not use drugs.   Family History   His family history includes Cancer in his brother; Lung cancer in his brother; Ovarian cancer in his sister; Stroke in his mother.   Allergies No Known Allergies   Home Medications  Prior to Admission medications   Medication Sig Start Date End Date Taking? Authorizing Provider   acetaminophen (TYLENOL) 500 MG tablet Take 500 mg by mouth every 6 (six) hours as needed for mild pain.    Yes [provider]  ARTIFICIAL TEAR OP Place 1 drop into both eyes daily.    Yes [provider]  fluticasone (FLONASE) 50 MCG/ACT nasal spray Place 1 spray into both nostrils daily as needed for allergies.    Yes [provider]  Ibuprofen (ADVIL) 200 MG CAPS Take 1 capsule by mouth daily as needed (for pain).    Yes [provider]  losartan (COZAAR) 50 MG tablet Take 1 tablet (50  mg total) by mouth daily. 05/17/18  Yes Hilty, Nadean Corwin, MD     Critical care time: 76    The patient is critically ill with multiple organ system failure and requires high complexity decision making for assessment and support, frequent evaluation and titration of therapies, advanced monitoring, review of radiographic studies and interpretation of complex data.   Critical Care Time devoted to patient care services, exclusive of separately billable procedures, described in this note is 35 minutes.   Marshell Garfinkel MD [facility] Pager [phone] If no answer call [phone] 04/14/2019, 8:47 AM

## 2019-03-29 NOTE — Progress Notes (Signed)
Interval history After long discussion with the patient and his spouse he is agreed to surgery.  He has been evaluated by Dr. Johney Maine at the bedside who explained the procedure and the patient's ready to proceed after some further discussion. Given his slow but steady decline we decided to proceed with stabilization in the intensive care so that he can go straight back to the operating room as OR beds were not readily available.  He is now successfully intubated, right subclavian triple-lumen catheter placed in left radial arterial line successfully placed.  He did get hypotensive post intubation necessitating crystalloid bolus and brief initiation of norepinephrine.   Blood Pressure 125/61   Pulse (Abnormal) 141   Temperature 97.7 F (36.5 C) (Axillary)   Respiration (Abnormal) 22   Height 6\' 2"  (1.88 m)   Weight 42.1 kg   Oxygen Saturation 100%   Body Mass Index 11.92 kg/m    Intake/Output Summary (Last 24 hours) at 04/04/2019 1513 Last data filed at 04/09/2019 1257 Gross per 24 hour  Intake 5250 ml  Output 1220 ml  Net 4030 ml     General: Frail 84 year old male patient.  HEENT normocephalic atraumatic no jugular venous distention in fact neck veins are flat he is now orally intubated pupils equal and reactive Pulmonary coarse scattered rhonchi greater on right than left portable chest x-ray which was personally reviewed by myself demonstrates endotracheal tube approximately 1 cm above the carina this is since been withdrawn he now has what appears to be right greater than left pulmonary infiltrates, I am worried about possible aspiration event, the triple-lumen catheter is at the cavoatrial junction Cardiac: Rapid regular rhythm seems to be room improving with volume resuscitation efforts Neuro: Currently sedated Extremities: Strong palpable pulses brisk capillary refill Abdomen: Had been tender to palpation hypoactive.  He has dark bilious nasogastric tube  output  Impression/plan Ischemic colitis with associated GI bleed in the setting of obstructing colonic mass Acute hypoxic respiratory failure Hemorrhagic/septic shock Aspiration pneumonia Lactic acidosis Mild coagulopathy Chronic anemia with acute blood loss Acute kidney injury  Discussion Multidiscipline bedside discussion with the patient including myself, surgical services and also later attending physician Dr. Vaughan Browner.  The patient is critically ill and needs emergent surgery.  He is now been stabilized with support tubes in place and is ready for operating room.  I discussed need for blood products and current arterial blood gas findings with anesthesia team who are prepared to assume care intraoperatively  Plan He is heading to the operating room for emergent exploratory surgery Full ventilator support, PAD protocol initiated Norepinephrine for mean arterial pressure greater than 65 he is currently completing lactated ringer bolus and is awaiting transfusion which will be ready for him on arrival to the operating room Antibiotics have been administered Family updated  My critical care time not including insertion of support intravenous and arterial lines: 60 minutes, this included stabilization of blood pressure ventilator management and bedside complex decision making  Erick Colace ACNP-BC [place] Pager # [phone] OR # [phone] if no answer

## 2019-03-29 NOTE — Progress Notes (Signed)
CRITICAL VALUE ALERT  Critical Value:  Lactic 2.2  Date & Time Notied:  03/28/2019 3:28 PM   Provider Notified: Yes  Orders Received/Actions taken: None

## 2019-03-29 NOTE — Progress Notes (Addendum)
eLink Physician-Brief Progress Note Patient Name: Darryl Johns DOB: Jul 18, 1934 MRN: [id number]   Date of Service  04/11/2019  HPI/Events of Note  Request for vent and sedation orders. Patient seen asleep. On PRVC 22/650/50/5 PEEP  eICU Interventions  Mech vent ordered at 6 cc/kg IBW Propofol ordered Fentanyl drip ordered as well     Intervention Category Major Interventions: Respiratory failure - evaluation and management Minor Interventions: Agitation / anxiety - evaluation and management  Shona Needles Frank Pilger 04/11/2019, 8:30 PM

## 2019-03-29 NOTE — Progress Notes (Signed)
   CT results show mass and volvulus  Surgery is aware  Gatha Mayer, MD, [facility] 04/07/2019 1:49 PM Pager [phone]  IMPRESSION: 1. Small-bowel obstruction with signs of bowel ischemia secondary to colonic mass with involvement of adjacent small bowel. 2. Small bowel loops in the right hemiabdomen are jejunal. 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 3. No signs of free air at this time though bowel with this appearance is at risk for perforation. 4. Signs of bilateral pneumonia and/or aspiration related changes. 5. Free fluid without abscess is likely related to ischemic enteritis. 6. These results were called by telephone at the time of interpretation on 04/06/2019 at 12:55 pm to provider [facility] , who verbally acknowledged these results.  Electronically Signed   By: Zetta Bills M.D.   On: 04/05/2019 12:56

## 2019-03-29 NOTE — Procedures (Signed)
Intubation Procedure Note Darryl Johns [id number] [birth year]  Procedure: Intubation Indications: Respiratory insufficiency  Procedure Details Consent: Risks of procedure as well as the alternatives and risks of each were explained to the (patient/caregiver).  Consent for procedure obtained. Time Out: Verified patient identification, verified procedure, site/side was marked, verified correct patient position, special equipment/implants available, medications/allergies/relevent history reviewed, required imaging and test results available.  Performed  Maximum sterile technique was used including antiseptics, cap, gloves, gown, hand hygiene, mask and sheet.  MAC and 4    Evaluation Hemodynamic Status: BP stable throughout; O2 sats: stable throughout Patient's Current Condition: stable Complications: No apparent complications Patient did tolerate procedure well. Chest X-ray ordered to verify placement.  CXR: tube position acceptable.   Darryl Johns 04/24/2019

## 2019-03-29 NOTE — Progress Notes (Signed)
GI asked WOC to look at patient for potential ielostomy and abdomen is marked with a dot and a tegaderm and ileostomy supplies at bedside but no ostomy currently at this time. Modena Morrow E, RN 03/30/2019 1:05 PM

## 2019-03-29 NOTE — Procedures (Signed)
Central Venous Catheter Insertion Procedure Note Shenouda Motola [id number] July 11, 1934  Procedure: Insertion of Central Venous Catheter Indications: Assessment of intravascular volume, Drug and/or fluid administration and Frequent blood sampling  Procedure Details Consent: Risks of procedure as well as the alternatives and risks of each were explained to the (patient/caregiver).  Consent for procedure obtained. Time Out: Verified patient identification, verified procedure, site/side was marked, verified correct patient position, special equipment/implants available, medications/allergies/relevent history reviewed, required imaging and test results available.  Performed  Maximum sterile technique was used including antiseptics, cap, gloves, gown, hand hygiene, mask and sheet. Skin prep: Chlorhexidine; local anesthetic administered A antimicrobial bonded/coated triple lumen catheter was placed in the left subclavian vein using the Seldinger technique.  Evaluation Blood flow good Complications: No apparent complications Patient did tolerate procedure well. Chest X-ray ordered to verify placement.  CXR: normal.  [name] 04/20/2019, 3:03 PM Erick Colace ACNP-BC [place] Pager # [phone] OR # [phone] if no answer

## 2019-03-29 NOTE — Progress Notes (Signed)
Initial Nutrition Assessment  DOCUMENTATION CODES:   Severe malnutrition in context of chronic illness  INTERVENTION:   Noted [place] discussions in progress, if pt remains full code, Recommend initiation of TPN.  Recommendations relayed to MD. TPN will need to be initiated cautiously given high refeeding risk. No more than 10-15 kcals/kg (420-630 kcals) upon initiation, no more than 100 g of dextrose. Addition of Thiamine 100 mg IV recommended.  Monitor magnesium, potassium, and phosphorus for at least 5 occurences, MD to replete as needed, as pt is at risk for refeeding syndrome given significant weight loss with inadequate nutritional intake and severe malnutrition present on admission   NUTRITION DIAGNOSIS:   Severe Malnutrition related to cancer and cancer related treatments, chronic illness(colon cancer, SBO) as evidenced by severe fat depletion, severe muscle depletion, energy intake < 75% for > or equal to 1 month.  GOAL:   Patient will meet greater than or equal to 90% of their needs  MONITOR:   Diet advancement, Labs, Weight trends, Skin  REASON FOR ASSESSMENT:   Other (Comment)    ASSESSMENT:   84 yo male admitted with shock, aspiration pneumonia with incidental finding of lung nodule on CT, suspected colon cancer with possible GI bleed, AKI. PMH includes HTN, peripheral neuropathy   Pt with ongoing hematemesis overnight x 4 with symptomatic anemia; transferred to ICU  Noted [place] discussions ongoing with pt and wife; pt Full Code at present  Per CT abdomen, pt with mass and volvulus. Plan for surgical intervention  Wife at bedside; pt reports he has only been able to eat soup and drink water and 3 Boost/Ensure per day  Pt reports UBW aorund 168 pounds 3 months ago. Wife indicates pt lost down to 130 pounds. Current wt 93 pounds. Regardless, weight loss >20% in 3 months which is significant. Per weight encounters, pt weighed 163 pounds in Feb 2020  Noted Gregory RN  consulted for ileostomy prep  Labs: Creatinine 1.52, BUN 38, corrected calcium 10.1, albumin 1.9, CBG 89 Meds: reviewed  NUTRITION - FOCUSED PHYSICAL EXAM:    Most Recent Value  Orbital Region  Severe depletion  Upper Arm Region  Severe depletion  Thoracic and Lumbar Region  Severe depletion  Buccal Region  Severe depletion  Temple Region  Severe depletion  Clavicle Bone Region  Severe depletion  Clavicle and Acromion Bone Region  Severe depletion  Scapular Bone Region  Severe depletion  Dorsal Hand  Moderate depletion  Patellar Region  Moderate depletion  Anterior Thigh Region  Moderate depletion  Posterior Calf Region  Moderate depletion  Edema (RD Assessment)  None       Diet Order:   Diet Order            Diet NPO time specified Except for: [employer], Sips with Meds  Diet effective now              EDUCATION NEEDS:   Not appropriate for education at this time  Skin:  Skin Assessment: Reviewed RN Assessment  Last BM:  10/02  Height:   Ht Readings from Last 1 Encounters:  04/11/2019 6\' 2"  (1.88 m)    Weight:   Wt Readings from Last 1 Encounters:  03/31/2019 42.1 kg    Ideal Body Weight:  86.4 kg  BMI:  Body mass index is 11.92 kg/m.  Estimated Nutritional Needs:   Kcal:  1500-1700 kcals  Protein:  85-100 g  Fluid:  >/= 1.5 L  Kerman Passey MS, RDN, LDN,  CNSC [phone] Pager  [phone] Weekend/On-Call Pager

## 2019-03-29 NOTE — ED Notes (Signed)
Paged critical care per RN

## 2019-03-29 NOTE — Op Note (Addendum)
04/03/2019  6:23 PM  PATIENT:  Darryl Johns  84 y.o. male  Patient Care Team: Kristie Cowman, MD as PCP - General (Family Medicine) Debara Pickett Nadean Corwin, MD as Consulting Physician (Cardiology) Michael Boston, MD as Consulting Physician (Colon and Rectal Surgery) Coral Spikes, MD as Consulting Physician (Gastroenterology) Kathrynn Ducking, MD as Consulting Physician (Neurology)  PRE-OPERATIVE DIAGNOSIS:  Bulky transverse colon mass.  Probable cancer  Small bowel volvulus with probable ischemia   POST-OPERATIVE DIAGNOSIS:   Bulky transverse colon mass.  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  See anesthesia record  Delay start of Pharmacological VTE agent (>24hrs) due to surgical blood loss or risk of bleeding:  no  DRAINS: none   SPECIMENS:   Proximal colon to the distal transverse colon (cecum, appendix, ascending colon, hepatic flexure, transverse colon)  Proximal jejunum  Nodule on the surface of the right hepatic dome of the liver  DISPOSITION OF SPECIMEN:  PATHOLOGY  COUNTS:  YES  PLAN OF CARE: Admit to inpatient   PATIENT DISPOSITION:  ICU - intubated and critically ill.  INDICATION: Elderly gentleman with unintentional weight loss and bowel changes.  Abdominal discomfort.  Abdominal mass.  CT scan revealed bulky tumor involving the transverse colon highly suspicious for cancer.  Work-up underway for colonoscopy and colectomy later this month.  Patient passed out last night and came in with tachycardia  and shock.  Concern for infection.  Pulmonary work-up negative.  CT scan of abdomen noted small bowel loops twisted with significant edema and probable ischemia associated with the mid transverse colon mass.  I have recommended emergent abdominal exploration to the patient and his wife at the bedside.  Medicine and critical care agreed.  The anatomy & physiology of the digestive tract was discussed.  The pathophysiology of perforation was discussed.  Differential diagnosis such as perforated ulcer or colon, etc was discussed.   Natural history risks without surgery such as death was discussed.  I recommended abdominal exploration to diagnose & treat the source of the problem.  Laparoscopic & open techniques were discussed.   Risks such as bleeding, infection, abscess, leak, reoperation, bowel resection, possible ostomy, injury to other organs, need for repair of tissues / organs, hernia, heart attack, death, and other risks were discussed.   The risks of no intervention will lead to serious problems including death.   I expressed a good likelihood that surgery will address the problem.    Goals of post-operative recovery were discussed as well.  We will work to minimize complications although risks in an emergent setting are high.   Questions were answered.  The patient expressed understanding & wishes to proceed with surgery.       OR FINDINGS:  Bulky mid transverse colonic mass very inflamed.  Suspicious for possible microperforation but no feculent contamination.  Moderate ascites.  Proximal jejunum just distal ligament of Treitz x2 feet twisted around to the colon tumor and trapped by omentum causing a volvulus and closed-loop type obstruction.  Distal jejunum with edema.  Ileum without inflammation. Patient has a side-to-side  staple anastomosis of the very proximal jejunum at the ligament of Treitz to mid jejunum isoperistaltic.  Proximal colon including tumor excised en bloc with proximal  jejunum.  End ileostomy in right supraumbilical paramedian region.  Prolene sutures on the proximal Hartmann staple line at the distal transverse colon near the splenic flexure.  Hard fibrotic 5 mm nodule on the dome of the right liver.  Wedge biopsy done.  No other evidence of any intraperitoneal carcinomatosis.  DESCRIPTION:   Informed consent was confirmed.   The patient received IV antibiotics and underwent general anesthesia without any difficulty.  He had foul diarrhea with some old blood and melena after anesthesia.  This was cleaned up.  The patient was positioned appropriately. Foley catheter had been sterilely placed. SCDs were active during the entire case.  The abdomen was prepped and draped in a sterile fashion.  Surgical timeout confirmed our plan.  Entry was gained through a midline incision.    I encountered some free air.  Hazy but yellow ascites.  No frank bile or feculent contamination.  Small bowel was edematous.  Upper abdomen: Was quite matted with an obvious large transverse colon mass very suggestive of cancer.  Next incision the subxiphoid region infraumbilically.  Placed a Balfour and transitioned over to a Bookwalter retractor to get good visualization.  I explored the abdomen.  Stomach not inflamed,  GB distended but no cholecystitis.  Duodenum cleared.  No ulcer.  The ileum was decompressed and seemed normal.  However running the bowel more proximally the jejunum was edematous.  I came between the greater curvature the stomach and the distal transverse colon to get into the lesser sac.  I transected the stomach off the obvious tumor staying closer to the stomach while trying to preserve the gastroepiploic along the greater curvature the stomach.  That help mobilize the large mass better.  The proximal jejunum was quite twisted around the colon with what little greater omentum was remaining sort of trapping it.  The more proximal jejunum had frank patches of ischemia and some  necrosis.  The mesentery had ischemia and some necrosis as well of the proximal third of the small intestine.  The 10 cm at the ligament of Treitz at the very very proximal jejunum seem to be spared.  I went ahead and proceeded with transection.  I mobilized the colon in a lateral medial fashion from the ileocecal valve and right paracolic gutter and hepatic flexure.  I rolled the distal ileal mesentery and ascending colon off the retroperitoneum.  Found the duodenal sweep and pancreatic head and was able to bring the proximal colon off the right retroperitoneum safely.  Also of Gerota's fascia.  There was some edema but there was no evidence of any invasion into the duodenal sweep or pancreatic head.  I transected the distal ileal mesentery with LigaSure radially and at the terminal ileum using a 75 GIA stapler.  Isolated the ileocolic pedicle did a high ligation using clamps and 2-0 silk suture ligature as it came off the superior mesenteric pedicle.  LigaSure on the specimen side.  He did not seem to have a separate right colic coming off so I continue that more proximally.  I then focused on the distal end.  Found a window at the junction with the mid and distal transverse colon.  Made a window 7 cm distal to the most distal end of the bulky mid transverse colon mass.  Transected with a stapler at this junction.  Took the mesentery along the left middle colic pedicle radially down towards the base.  With proximal and distal elements elevated I carefully took off the mid transverse colon and middle colic pedicles off the retroperitoneum using vessel sealer as well as occasional clamps and silk suture and ties.  This released the colon.  With this I could see where the proximal jejunum was adherent to the tumor and mesocolon of the mid transverse colon.  I transected the jejunal mesentery radially where it was soft and not edematous nor ischemic about 10 cm distal to the ligament of Treitz.  Chose an area in  the mid jejunum that while edematous was clearly viable.  Transected the mesentery more proximally, sparing the rest of the small bowel mesentery.  Having marked the proximal distal ends of jejunum that had been edematous and ischemic and went and transected the pedicle more proximally.  With this the mass was removed en bloc with the proximal right colon transverse colon mass and proximal jejunum.  I did inspection.  Assured hemostasis on the right retroperitoneum near Gerota's fascia.  Otherwise look clear.  Ran the small bowel.  It was viable.  This pedicle was somewhat narrowed allowing it to be much more mobile and easier to volvulized.  However I could open up and lay it flat.  I did Dopplers on the edematous ends of the jejunum and got obvious pulsatile signals right at the staple lines of the mid jejunum and at the ligament of Treitz.  I did a side-to-side stapled anastomosis in an isoperistaltic overlapping fashion.  I mobilized the ligament of Treitz off the retroperitoneum and aorta and inferior pancreatic ridge to let it straighten out more.  I brought the staple line of the mid jejunum down towards the duodenal jejunal junction.  Place a silk suture there.  I then did a gentle silk suture on the distal staple line of the ligament of Treitz to the jejunum.  I created antimesenteric enterotomies and did a side-to-side staple anastomosis with a 75 GIA stapler.  The first did not fire completely.  I removed it and only about 30 mm have been stapled.  We got a brand-new stapler and I did another load.  Was able to get the side-to-side stapled anastomosis longer.  Ended up being about 75 mm long.  Mucosa viable on both sides.  I closed the common channel with a 3-0 PDS running Connell suture starting at each corner, avoiding narrowing the lumen to good result.  I then allow the jejunum to lay in the left upper quadrant and sweep down towards the pelvis to keep the mesentery wide and broad.  Next we did  copious irrigation several liters of warm saline and had good hemostasis.  I did more meticulous inspection.  I could see no evidence of any carcinomatosis or other concerns except for I could feel a fibrous nodule in the dome of the right liver.  I mobilized it into the field confirmed a rocky nodule on the surface of the right hepatic lobe.  Suspicious for metastasis.  I used cautery to have a wedge resection of this.  Used cautery to provide hemostasis on the dome of the liver.  Packed that.  Did reinspection anastomosis in the bowel.  I made a circular skin defect in the right supraumbilical paramedian region at the site that been premarked by our ostomy nurse.  3 cm diameter defect of the subcutaneous tissue.  I split the anterior to's fascia transversely.  Split the rectus muscle and came through the posterior to's fascia vertically.  Brought the end of the terminal ileum up.  Again respected the small bowel and allow the terminal ileum to lay in the pelvis and slowly sweep up so was nice and broad.  The very proximal jejunojejunostomy anastomosis laid well in the left upper quadrant over the pancreatic tail.  The stomach was enlarged and came down midline.  There was really very little omentum to start with so I cannot have much to lay to protect it.  I closed the midline incision with #1 PDS in a running fashion.  Left the subcutaneous tissues open and placed a gray sponge over it and wound VAC.  We tested it, and it held suction.  I changed gloves and matured the end ileostomy in a Brooke fashion using 3-0 Vicryl interrupted sutures.  I cut the staple line and got good bleeding at the edges implying a good viable hand.  Mesentery resting at 12:00 superiorly.  I started in 4 corners to help evert and get a nice 3 cm tall edematous but pink rosebud.  Finger to patient had a nice straight shot.  Ostomy appliance placed.  Patient required levophed during the case at 4.  He was resuscitated by anesthesia  including blood products.   While blood loss was not very much during the case, his hemoglobin went from 9 down to 7 on repeat evaluation most likely reflecting his melena from his ischemic/necrotic jejunum.  He was transfused some blood intraoperatively.  Please see the anesthesia for record.  Patient is to be remain intubated to go back to intensive care unit.  About to try and reach the patient's wife to update her.  Surgery, critical care, medicine will follow the patient closely.  I discussed operative findings, updated the patient's status, discussed probable steps to recovery, and gave postoperative recommendations to the patient's family.  Recommendations were made.  Questions were answered.  They expressed understanding & appreciation.    Adin Hector, M.D., F.A.C.S. Gastrointestinal and Minimally Invasive Surgery [facility] [address], [place], [place] [address] [phone] Main / Paging

## 2019-03-29 NOTE — Consult Note (Signed)
Consult requested for ileostomy marking.  This was already performed this morning at 0800; refer to previous progress note.   [place] team will follow post-op for teaching on Monday. Julien Girt MSN, RN, [place], [place], [place]

## 2019-03-29 NOTE — Procedures (Signed)
Intubation Procedure Note Darryl Johns [id number] 1935-03-22  Procedure: Intubation Indications: Respiratory insufficiency  Procedure Details Consent: Risks of procedure as well as the alternatives and risks of each were explained to the (patient/caregiver).  Consent for procedure obtained. Time Out: Verified patient identification, verified procedure, site/side was marked, verified correct patient position, special equipment/implants available, medications/allergies/relevent history reviewed, required imaging and test results available.  Performed  Maximum sterile technique was used including cap, gloves, gown, hand hygiene and mask.  Glidescope,1 attempt   Evaluation Hemodynamic Status: BP stable throughout; O2 sats: stable throughout Patient's Current Condition: stable Complications: No apparent complications Patient did tolerate procedure well. Chest X-ray ordered to verify placement.  CXR: pending.   Darryl Johns 04/24/2019

## 2019-03-29 NOTE — Procedures (Signed)
Arterial Catheter Insertion Procedure Note Darryl Johns [id number] Dec 05, 1934  Procedure: Insertion of Arterial Catheter  Indications: Blood pressure monitoring and Frequent blood sampling  Procedure Details Consent: Risks of procedure as well as the alternatives and risks of each were explained to the (patient/caregiver).  Consent for procedure obtained. Time Out: Verified patient identification, verified procedure, site/side was marked, verified correct patient position, special equipment/implants available, medications/allergies/relevent history reviewed, required imaging and test results available.  Performed  Maximum sterile technique was used including antiseptics, cap, gloves, gown, hand hygiene, mask and sheet. Skin prep: Chlorhexidine; local anesthetic administered 20 gauge catheter was inserted into left radial artery using the Seldinger technique. ULTRASOUND GUIDANCE USED: NO Evaluation Blood flow good; BP tracing good. Complications: No apparent complications.   Darryl Johns 04/27/2019   Darryl Johns ACNP-BC [place] Pager # [phone] OR # [phone] if no answer

## 2019-03-29 NOTE — Discharge Instructions (Signed)
Ostomy Support Information ° °You’ve heard that people get along just fine with only one of their eyes, or one of their lungs, or one of their kidneys. But you also know that you have only one intestine and only one bladder, and that leaves you feeling awfully empty, both physically and emotionally: You think no other people go around without part of their intestine with the ends of their intestines sticking out through their abdominal walls.  ° °YOU ARE NOT ALONE.  There are nearly three quarters of a million people in the [place] who have an ostomy; people who have had surgery to remove all or part of their colons or bladders.  ° °There is even a national association, the [employer] with over 350 local affiliated support groups that are organized by volunteers who provide peer support and counseling. [employer] has a toll free telephone num-ber, 800-826-0826 and an educational, interactive website, www.ostomy.org  ° °An ostomy is an opening in the belly (abdominal wall) made by surgery. Ostomates are people who have had this procedure. The opening (stoma) allows the kidney or bowel to discharge waste. An external pouch covers the stoma to collect waste. Pouches are are a simple bag and are odor free. Different companies have disposable or reusable pouches to fit one's lifestyle. An ostomy can either be temporary or permanent.  ° °THERE ARE THREE MAIN TYPES OF OSTOMIES °· Colostomy. A colostomy is a surgically created opening in the large intestine (colon). °· Ileostomy. An ileostomy is a surgically created opening in the small intestine. °· Urostomy. A urostomy is a surgically created opening to divert urine away from the bladder. ° °OSTOMY Care ° °The following guidelines will make care of your colostomy easier. Keep this information close by for quick reference. ° °Helpful DIET hints °Eat a well-balanced diet including vegetables and fresh fruits. Eat on a regular schedule. Drink at least 6 to 8  glasses of fluids daily. °Eat slowly in a relaxed atmosphere. Chew your food thoroughly. Avoid chewing gum, smoking, and drinking from a straw. This will help decrease the amount of air you swallow, which may help reduce gas. °Eating yogurt or drinking buttermilk may help reduce gas. ° °To control gas at night, do not eat after 8 p.m. This will give your bowel time to quiet down before you go to bed. ° °If gas is a problem, you can purchase Beano. Sprinkle Beano on the first bite of food before eating to reduce gas. It has no flavor and should not change the taste of your food. You can buy Beano over the counter at your local drugstore. ° °Foods like fish, onions, garlic, broccoli, asparagus, and cabbage produce odor. Although your pouch is odor-proof, if you eat these foods you may notice a stronger odor when emptying your pouch. If this is a concern, you may want to limit these foods in your diet. ° °If you have an ileostomy, you will have chronic diarrhea & need to drink more liquids to avoid getting dehydrated.  Consider antidiarrheal medicine like imodium (loperamide) or Lomotil to help slow down bowel movements / diarrhea into your ileostomy bag. ° °GETTING TO GOOD BOWEL HEALTH WITH AN ILEOSTOMY °. °Irregular bowel habits such as constipation and diarrhea can lead to many problems over time.  The goal: 3-6 small BOWEL MOVEMENTS A DAY!  To have soft, regular bowel movements:  °• Drink plenty of fluids, consider 4-6 tall glasses of water a day.   ° °Controlling   diarrheA ° °o Switch to liquids and simpler foods for a few days to avoid stressing your intestines further. °o Avoid dairy products (especially milk & ice cream) for a short time.  The intestines often can lose the ability to digest lactose when stressed. °o Avoid foods that cause gassiness or bloating.  Typical foods include beans and other legumes, cabbage, broccoli, and dairy foods.  Every person has some sensitivity to other foods, so listen to our  body and avoid those foods that trigger problems for you. °o Adding fiber (Citrucel, Metamucil, psyllium, Miralax) gradually can help thicken stools by absorbing excess fluid and retrain the intestines to act more normally.  Slowly increase the dose over a few weeks.  Too much fiber too soon can backfire and cause cramping & bloating. °o Probiotics (such as active yogurt, Align, etc) may help repopulate the intestines and colon with normal bacteria and calm down a sensitive digestive tract.  Most studies show it to be of mild help, though, and such products can be costly. °o Medicines: °- Bismuth subsalicylate (ex. Kayopectate, Pepto Bismol) every 30 minutes for up to 6 doses can help control diarrhea.  Avoid if pregnant. °- Loperamide (Immodium) can slow down diarrhea.  Start with two tablets (4mg total) first and then try one tablet every 6 hours.  Avoid if you are having fevers or severe pain.  If you are not better or start feeling worse, stop all medicines and call your doctor for advice °o Call your doctor if you are getting worse or not better.  Sometimes further testing (cultures, endoscopy, X-ray studies, bloodwork, etc) may be needed to help diagnose and treat the cause of the diarrhea. ° °TROUBLESHOOTING IRREGULAR BOWELS °1) Avoid extremes of bowel movements (no bad constipation/diarrhea) °2) Miralax 17gm mixed in 8oz. water or juice-daily. May use twice a day as needed  °3) Gas-x,Phazyme, etc. as needed for gas & bloating.  °4) Soft,bland diet. No spicy,greasy,fried foods.  °5) Prilosec (omeprazole) over-the-counter as needed  °6) May hold gluten/wheat products from diet to see if symptoms improve.  °7)  May try probiotics (Align, Activa, etc) to help calm the bowels down °7) If symptoms become worse call back immediately. ° ° °Applying the pouching system °To apply your pouch, follow these steps: ° °Place all your equipment close at hand before removing your pouch. ° °Wash your hands. ° °Stand or sit in  front of a mirror. Use the position that works best for you. Remember that you must keep the skin around the stoma wrinkle-free for a good seal. ° °Gently remove the used pouch (1-piece system) or the pouch and old wafer (2-piece system). Empty the pouch into the toilet. Save the closure clip to use again. ° °Wash the stoma itself and the skin around the stoma. Your stoma may bleed a little when being washed. This is normal. Rinse and pat dry. You may use a wash cloth or soft paper towels (like Bounty), mild soap (like Dial, Safeguard, or Ivory), and water. Avoid soaps that contain perfumes or lotions. ° °For a new pouch (1-piece system) or a new wafer (2-piece system), measure your stoma using the stoma guide in each box of supplies. ° °Trace the shape of your stoma onto the back of the new pouch or the back of the new wafer. Cut out the opening. Remove the paper backing and set it aside. ° °Optional: Apply a skin barrier powder to surrounding skin if it is irritated (bare or weeping),   and dust off the excess. °Optional: Apply a skin-prep wipe (such as Skin Prep or All-Kare) to the skin around the stoma, and let it dry. Do not apply this solution if the skin is irritated (red, tender, or broken) or if you have shaved around the stoma. °Optional: Apply a skin barrier paste (such as Stomahesive, Coloplast, or Premium) around the opening cut in the back of the pouch or wafer. Allow it to dry for 30 to 60 seconds. ° °Hold the pouch (1-piece system) or wafer (2-piece system) with the sticky side toward your body. Make sure the skin around the stoma is wrinkle-free. Center the opening on the stoma, then press firmly to your abdomen (Fig. 4). Look in the mirror to check if you are placing the pouch, or wafer, in the right position. For a 2-piece system, snap the pouch onto the wafer. Make sure it snaps into place securely. ° °Place your hand over the stoma and the pouch or wafer for about 30 seconds. The heat from your  hand can help the pouch or wafer stick to your skin. ° °Add deodorant (such as Super Banish or Nullo) to your pouch. Other options include food extracts such as vanilla oil and peppermint extract. Add about 10 drops of the deodorant to the pouch. Then apply the closure clamp. Note: Do not use toxic ° chemicals or commercial cleaning agents in your pouch. These substances may harm the stoma. ° °Optional: For extra seal, apply tape to all 4 sides around the pouch or wafer, as if you were framing a picture. You may use any brand of medical adhesive tape. °Change your pouch every 5 to 7 days. Change it immediately if a leak occurs.  Wash your hands afterwards. ° °If you are wearing a 2-piece system, you may use 2 new pouches per week and alternate them. Rinse the pouch with mild soap and warm water and hang it to dry for the next day. Apply the fresh pouch. Alternate the 2 pouches like this for a week. After a week, change the wafer and begin with 2 new pouches. Place the old pouches in a plastic bag, and put them in the trash. ° ° ° °Tips for colostomy care ° °Applying Your Pouch °You may stand or sit to apply your pouch. ° °Keep the skin where you apply the pouch wrinkle-free. If the skin around the pouch is wrinkled, the seal may break when your skin stretches. ° °If hair grows close to your stoma, you may trim off the hair with scissors, an electric razor, or a safety razor. ° °Always have a mirror nearby so you can get a better view of your stoma. ° °When you apply a new pouch, write the date on the adhesive tape. This will remind you of when you last changed your pouch. ° °Changing Your Pouch °The best time to change your pouch is in the morning, before eating or drinking anything. Your stoma can function at any time, but it will function more after eating or drinking. ° °Emptying Your Pouch °Empty your pouch when it is one-third full (of urine, stool, and/or gas). If you wait until your pouch is fuller than this,  it will be more difficult to empty and more noticeable. °When you empty your pouch, either put toilet paper in the toilet bowl first, or flush the toilet while you empty the pouch. This will reduce splashing. You can empty the pouch between your legs or to one side while sitting,   or while standing or stooping. If you have a 2-piece system, you can snap off the pouch to empty it. Remember that your stoma may function during this time. °If you wish to rinse your pouch after you empty it, a turkey baster can be helpful. When using a baster, squirt water up into the pouch through the opening at the °bottom. With a 2-piece system, you can snap off the pouch to rinse it. After rinsing  your pouch, empty it into the toilet. °When rinsing your pouch at home, put a few granules of Dreft soap in the rinse water. This helps lubricate and freshen your pouch. °The inside of your pouch can be sprayed with non-stick cooking oil (Pam spray). This may help reduce stool sticking to the inside of the pouch. ° °Bathing °You may shower or bathe with your pouch on or off. Remember that your stoma may function during this time. ° °The materials you use to wash your stoma and the skin around it should be clean, but they do not need to be sterile. ° °Wearing Your Pouch °During hot weather, or if you perspire a lot in general, wear a cover over your pouch. This may prevent a rash on your skin under the pouch. Pouch covers are sold at ostomy supply stores. °Wear the pouch inside your underwear for better support. °Watch your weight. Any gain or loss of 10 to 15 pounds or more can change the way your pouch fits. ° °Going Away From Home °A collapsible cup (like those that come in travel kits) or a soft plastic squirt bottle with a pull-up top (like a travel bottle for shampoo) can be used for rinsing your pouch when you are away from home. Tilt the opening of the pouch at an upward angle when using a cup to rinse. ° °Carry wet wipes or extra  tissues to use in public bathrooms. ° °Carry an extra pouching system with you at all times. ° °Never keep ostomy supplies in the glove compartment of your car. Extreme heat or cold can damage the skin barriers and adhesive wafers on the pouch. ° °When you travel, carry your ostomy supplies with you at all times. Keep them within easy reach. Do not pack ostomy supplies in baggage that will be checked or otherwise separated from you, because your baggage might be lost. If you’re traveling out of the country, it is helpful to have a letter stating that you are carrying ostomy supplies as a medical necessity. ° °If you need ostomy supplies while traveling, look in the yellow pages of the telephone book under “Surgical Supplies.” Or call the local ostomy organization to find out where supplies are available. ° °Do not let your ostomy supplies get low. Always order new pouches before you use the last one. ° °Reducing Odor °Limit foods such as broccoli, cabbage, onions, fish, and garlic in your diet to help reduce odor. °Each time you empty your pouch, carefully clean the opening of the pouch, both inside and outside, with toilet paper. °Rinse your pouch 1 or 2 times daily after you empty it (see directions for emptying your pouch and going away from home). °Add deodorant (such as Super Banish or Nullo) to your pouch. °Use air deodorizers in your bathroom. °Do not add aspirin to your pouch. Even though aspirin can help prevent odor, it could cause ulcers on your stoma. ° °When to call the doctor °Call the doctor if you have any of the following symptoms: °Purple, black,   or white stoma Severe cramps lasting more than 6 hours Severe watery discharge from the stoma lasting more than 6 hours No output from the colostomy for 3 days Excessive bleeding from your stoma Swelling of your stoma to more than 1/2-inch larger than usual Pulling inward of your stoma below skin level Severe skin irritation or deep ulcers Bulging  or other changes in your abdomen  When to call your ostomy nurse Call your ostomy/enterostomal therapy (ET) nurse if any of the following occurs: Frequent leaking of your pouching system Change in size or appearance of your stoma, causing discomfort or problems with your pouch Skin rash or rawness Weight gain or loss that causes problems with your pouch      FREQUENTLY ASKED QUESTIONS   Why havent you met any of these folks who have an ostomy?  Well, maybe you have! You just did not recognize them because an ostomy doesn't show. It can be kept secret if you wish. Why, maybe some of your best friends, office associates or neighbors have an ostomy ... you never can tell.   People facing ostomy surgery have many quality-of-life questions like:  Will you bulge? Smell? Make noises? Will you feel waste leaving your body? Will you be a captive of the toilet? Will you starve? Be a social outcast? Get/stay married? Have babies? Easily bathe, go swimming, bend over?  OK, lets look at what you can expect:   Will you bulge?  Remember, without part of the intestine or bladder, and its contents, you should have a flatter tummy than before. You can expect to wear, with little exception, what you wore before surgery ... and this in-cludes tight clothing and bathing suits.   Will you smell?  Today, thanks to modern odor proof pouching systems, you can walk into an ostomy support group meeting and not smell anything that is foul or offensive. And, for those with an ileostomy or colostomy who are concerned about odor when emptying their pouch, there are in-pouch deodorants that can be used to eliminate any waste odors that may exist.   Will you make noises?  Everyone produces gas, especially if they are an air-swallower. But intestinal sounds that occur from time to time are no differ-ent than a gurgling tummy, and quite often your clothing will muffle any sounds.   Will you feel the waste discharges?   For those with a colostomy or ileostomy there might be a slight pressure when waste leaves your body, but understand that the intestines have no nerve endings, so there will be no unpleasant sensations. Those with a urostomy will probably be unaware of any kidney drainage.   Will you be a captive of the toilet?  Immediately post-op you will spend more time in the bathroom than you will after your body recovers from surgery. Every person is different, but on average those with an ileostomy or urostomy may empty their pouches 4 to 6 times a day; a little  less if you have a colostomy. The average wear time between pouch system changes is 3 to 5 days and the changing process should take less than 30 minutes.   Will I need to be on a special diet? Most people return to their normal diet when they have recovered from surgery. Be sure to chew your food well, eat a well-balanced diet and drink plenty of fluids. If you experience problems with a certain food, wait a couple of weeks and try it again.  Will there be  odor and noises? Pouching systems are designed to be odor-proof or odor-resistant. There are deodorants that can be used in the pouch. Medications are also available to help reduce odor. Limit gas-producing foods and carbonated beverages. You will experience less gas and fewer noises as you heal from surgery.  How much time will it take to care for my ostomy? At first, you may spend a lot of time learning about your ostomy and how to take care of it. As you become more comfortable and skilled at changing the pouching system, it will take very little time to care for it.   Will I be able to return to work? People with ostomies can perform most jobs. As soon as you have healed from surgery, you should be able to return to work. Heavy lifting (more than 10 pounds) may be discouraged.   What about intimacy? Sexual relationships and intimacy are important and fulfilling aspects of your life. They  should continue after ostomy surgery. Intimacy-related concerns should be discussed openly between you and your partner.   Can I wear regular clothing? You do not need to wear special clothing. Ostomy pouches are fairly flat and barely noticeable. Elastic undergarments will not hurt the stoma or prevent the ostomy from functioning.   Can I participate in sports? An ostomy should not limit your involvement in sports. Many people with ostomies are runners, skiers, swimmers or participate in other active lifestyles. Talk with your caregiver first before doing heavy physical activity.  Will you starve?  Not if you follow doctors orders at each stage of your post-op adjustment. There is no such thing as an ostomy diet. Some people with an ostomy will be able to eat and tolerate anything; others may find diffi-culty with some foods. Each person is an individual and must determine, by trial, what is best for them. A good practice for all is to drink plenty of water.   Will you be a social outcast?  Have you met anyone who has an ostomy and is a social outcast? Why should you be the first? Only your attitude and self image will effect how you are treated. No confi-dent person is an Occupational psychologist.     PROFESSIONAL HELP  Resources are available if you need help or have questions about your ostomy.    Specially trained nurses called Wound, Ostomy Continence Nurses (WOCN) are available for consultation in most major medical centers.   Consider getting an ostomy consult at one of the outpatient ostomy clinics.  [facility] one in [place] at this time   The [employer] ([employer]) is a group made up of many local chapters throughout the [place]. These local groups hold meetings and provide support to prospective and existing ostomates. They sponsor educational events and have qualified visitors to make personal or telephone visits. Contact the [employer]OA for the chapter nearest you and for other  educational publications.   More detailed information can be found in Colostomy Guide, a publication of the [employer] (UOA). Contact [employer]OA at 1-704-169-6906 or visit their web site at [email or web address]/. The website contains links to other sites, suppliers and resources.   [employer] Start Services:  Start at the website to enlist for support.  Your Wound Ostomy (WOCN) nurse may have started this process. https://www.hollister.com/en/securestart  Secure Start services are designed to support people as they live their lives with an ostomy or neurogenic bladder. Enrolling is easy and at no cost to the patient. We realize that  each person's needs and life journey are different. Through Secure Start services, we want to help people live their life, their way.

## 2019-03-29 NOTE — ED Notes (Signed)
SDU   Ordered hospital bed 

## 2019-03-29 NOTE — ED Notes (Signed)
Pt moved to hospital bed. Sacral foam pad and yellow fall risk socks applied.

## 2019-03-29 NOTE — Progress Notes (Signed)
Patient seen on rounds early this am. MAPs in the 60s, tachycardic after 3.5L fluid bolus. Ongoing hematemesis overnight x4 with symptomatic anemia. GI consulted and we have discussed him again with them this am 2/2 tenuous status. Hemoglobin yesterday 8.9., current stat CBC pending.   GI recommends FFP 2 U, 1U RBCs, 10 vitamin K 2/2 INR 1.5 yesterday with unclear etiology (poss colon mass)  GI additionally requesting central line placement. ICU consulted, appreciate recommendations and central line placement  PPI gtt started  Additional 1L bolus   Amman Bartel A, DO 04/06/2019, 8:11 AM Pager: [phone]

## 2019-03-29 NOTE — Transfer of Care (Signed)
Immediate Anesthesia Transfer of Care Note  Patient: Darryl Johns  Procedure(s) Performed: EXPLORATORY LAPAROTOMY, PARTIAL EXTENDED RIGHT COLECTOMY, JEJUNOSTOMY (Abdomen) Ileostomy Application Of Wound Vac (Abdomen)  Patient Location: ICU  Anesthesia Type:General  Level of Consciousness: Patient remains intubated per anesthesia plan  Airway & Oxygen Therapy: Patient remains intubated per anesthesia plan and Patient placed on Ventilator (see vital sign flow sheet for setting)  Post-op Assessment: Report given to RN and Post -op Vital signs reviewed and stable  Post vital signs: Reviewed and stable  Last Vitals:  Vitals Value Taken Time  BP    Temp    Pulse 98 04/06/2019 1844  Resp 20 04/01/2019 1845  SpO2 100 % 04/02/2019 1844  Vitals shown include unvalidated device data.  Last Pain:  Vitals:   04/16/2019 1232  TempSrc: Axillary  PainSc:          Complications: No apparent anesthesia complications

## 2019-03-29 NOTE — ED Notes (Signed)
Oral care provided 

## 2019-03-29 NOTE — ED Notes (Signed)
Pt to CT

## 2019-03-29 NOTE — Consult Note (Signed)
[facility] Nurse requested for preoperative stoma site marking  Discussed surgical procedure and stoma creation with patient.  Explained role of the [place] nurse team.  Provided the patient with educational booklet and provided samples of pouching options.  Patient had no questions.   Examined patient lying in order to place the marking in the patient's visual field, away from any creases or abdominal contour issues and within the rectus muscle.  Attempted to mark above the patient's belt line.   Marked for ileostomy in the RUQ  __6__cm to the right of the umbilicus and  [id number] cm above the umbilicus.  Patient's abdomen cleansed with CHG wipes at site markings, allowed to air dry prior to marking.Covered mark with thin film transparent dressing to preserve mark until date of surgery.   [facility] Nurse team will follow up with patient after surgery for continue ostomy care and teaching.  Val Riles, RN, MSN, CWOCN, CNS-BC, pager [phone]

## 2019-03-30 DIAGNOSIS — I9581 Postprocedural hypotension: Secondary | ICD-10-CM

## 2019-03-30 DIAGNOSIS — A419 Sepsis, unspecified organism: Principal | ICD-10-CM

## 2019-03-30 DIAGNOSIS — J9601 Acute respiratory failure with hypoxia: Secondary | ICD-10-CM

## 2019-03-30 DIAGNOSIS — R6521 Severe sepsis with septic shock: Secondary | ICD-10-CM

## 2019-03-30 DIAGNOSIS — N179 Acute kidney failure, unspecified: Secondary | ICD-10-CM

## 2019-03-30 LAB — GLUCOSE, CAPILLARY
Glucose-Capillary: 191 mg/dL — ABNORMAL HIGH (ref 70–99)
Glucose-Capillary: 47 mg/dL — ABNORMAL LOW (ref 70–99)
Glucose-Capillary: 50 mg/dL — ABNORMAL LOW (ref 70–99)
Glucose-Capillary: 80 mg/dL (ref 70–99)
Glucose-Capillary: 68 mg/dL — ABNORMAL LOW (ref 70–99)
Glucose-Capillary: 91 mg/dL (ref 70–99)
Glucose-Capillary: 48 mg/dL — ABNORMAL LOW (ref 70–99)
Glucose-Capillary: 81 mg/dL (ref 70–99)
Glucose-Capillary: 68 mg/dL — ABNORMAL LOW (ref 70–99)
Glucose-Capillary: 78 mg/dL (ref 70–99)
Glucose-Capillary: 76 mg/dL (ref 70–99)

## 2019-03-30 LAB — CBC
HCT: 29.5 % — ABNORMAL LOW (ref 39.0–52.0)
HCT: 32.8 % — ABNORMAL LOW (ref 39.0–52.0)
HCT: 36.1 % — ABNORMAL LOW (ref 39.0–52.0)
Hemoglobin: 10.8 g/dL — ABNORMAL LOW (ref 13.0–17.0)
Hemoglobin: 11.2 g/dL — ABNORMAL LOW (ref 13.0–17.0)
Hemoglobin: 12.8 g/dL — ABNORMAL LOW (ref 13.0–17.0)
MCH: 27.7 pg (ref 26.0–34.0)
MCH: 28.6 pg (ref 26.0–34.0)
MCH: 29.3 pg (ref 26.0–34.0)
MCHC: 34.1 g/dL (ref 30.0–36.0)
MCHC: 35.5 g/dL (ref 30.0–36.0)
MCHC: 36.6 g/dL — ABNORMAL HIGH (ref 30.0–36.0)
MCV: 80.2 fL (ref 80.0–100.0)
MCV: 80.6 fL (ref 80.0–100.0)
MCV: 81.2 fL (ref 80.0–100.0)
Platelets: 206 10*3/uL (ref 150–400)
Platelets: 210 10*3/uL (ref 150–400)
Platelets: 220 10*3/uL (ref 150–400)
RBC: 3.68 MIL/uL — ABNORMAL LOW (ref 4.22–5.81)
RBC: 4.04 MIL/uL — ABNORMAL LOW (ref 4.22–5.81)
RBC: 4.48 MIL/uL (ref 4.22–5.81)
RDW: 17.1 % — ABNORMAL HIGH (ref 11.5–15.5)
RDW: 17.1 % — ABNORMAL HIGH (ref 11.5–15.5)
RDW: 17.4 % — ABNORMAL HIGH (ref 11.5–15.5)
WBC: 11.1 10*3/uL — ABNORMAL HIGH (ref 4.0–10.5)
WBC: 4 10*3/uL (ref 4.0–10.5)
WBC: 4.6 10*3/uL (ref 4.0–10.5)
nRBC: 0 % (ref 0.0–0.2)
nRBC: 0 % (ref 0.0–0.2)
nRBC: 0 % (ref 0.0–0.2)

## 2019-03-30 LAB — TYPE AND SCREEN
ABO/RH(D): B POS
Antibody Screen: NEGATIVE
Unit division: 0
Unit division: 0
Unit division: 0
Unit division: 0

## 2019-03-30 LAB — POCT I-STAT 7, (LYTES, BLD GAS, ICA,H+H)
Acid-base deficit: 3 mmol/L — ABNORMAL HIGH (ref 0.0–2.0)
Bicarbonate: 20.1 mmol/L (ref 20.0–28.0)
Calcium, Ion: 1.15 mmol/L (ref 1.15–1.40)
HCT: 31 % — ABNORMAL LOW (ref 39.0–52.0)
Hemoglobin: 10.5 g/dL — ABNORMAL LOW (ref 13.0–17.0)
O2 Saturation: 96 %
Patient temperature: 99.6
Potassium: 3.4 mmol/L — ABNORMAL LOW (ref 3.5–5.1)
Sodium: 140 mmol/L (ref 135–145)
TCO2: 21 mmol/L — ABNORMAL LOW (ref 22–32)
pCO2 arterial: 28.2 mmHg — ABNORMAL LOW (ref 32.0–48.0)
pH, Arterial: 7.463 — ABNORMAL HIGH (ref 7.350–7.450)
pO2, Arterial: 80 mmHg — ABNORMAL LOW (ref 83.0–108.0)

## 2019-03-30 LAB — BPAM RBC
Blood Product Expiration Date: 202010082359
Blood Product Expiration Date: 202010252359
Blood Product Expiration Date: 202010252359
Blood Product Expiration Date: 202010292359
ISSUE DATE / TIME: 202010021530
ISSUE DATE / TIME: 202010021530
ISSUE DATE / TIME: 202010021802
ISSUE DATE / TIME: 202010021802
Unit Type and Rh: 1700
Unit Type and Rh: 7300
Unit Type and Rh: 7300
Unit Type and Rh: 7300

## 2019-03-30 LAB — BASIC METABOLIC PANEL
Anion gap: 9 (ref 5–15)
BUN: 36 mg/dL — ABNORMAL HIGH (ref 8–23)
CO2: 21 mmol/L — ABNORMAL LOW (ref 22–32)
Calcium: 7.4 mg/dL — ABNORMAL LOW (ref 8.9–10.3)
Chloride: 110 mmol/L (ref 98–111)
Creatinine, Ser: 1.58 mg/dL — ABNORMAL HIGH (ref 0.61–1.24)
GFR calc Af Amer: 47 mL/min — ABNORMAL LOW (ref >60)
GFR calc non Af Amer: 40 mL/min — ABNORMAL LOW (ref >60)
Glucose, Bld: 59 mg/dL — ABNORMAL LOW (ref 70–99)
Potassium: 3.6 mmol/L (ref 3.5–5.1)
Sodium: 140 mmol/L (ref 135–145)

## 2019-03-30 LAB — LACTIC ACID, PLASMA: Lactic Acid, Venous: 1.9 mmol/L (ref 0.5–1.9)

## 2019-03-30 LAB — PHOSPHORUS: Phosphorus: 2.2 mg/dL — ABNORMAL LOW (ref 2.5–4.6)

## 2019-03-30 LAB — MAGNESIUM: Magnesium: 1.6 mg/dL — ABNORMAL LOW (ref 1.7–2.4)

## 2019-03-30 MED ORDER — DEXTROSE 50 % IV SOLN
INTRAVENOUS | Status: AC
  Filled 2019-03-30: qty 50

## 2019-03-30 MED ORDER — PANTOPRAZOLE SODIUM 40 MG IV SOLR: 40.0000 mg | Freq: Two times a day (BID) | INTRAVENOUS | Status: DC

## 2019-03-30 MED ORDER — ACETAMINOPHEN 10 MG/ML IV SOLN
1000.0000 mg | Freq: Three times a day (TID) | INTRAVENOUS | Status: AC | PRN
  Filled 2019-03-30 (×3): qty 100

## 2019-03-30 MED ORDER — LACTATED RINGERS IV BOLUS
500.0000 mL | Freq: Once | INTRAVENOUS | Status: AC
  Administered 2019-03-30: 500 mL via INTRAVENOUS

## 2019-03-30 MED ORDER — ALBUMIN HUMAN 25 % IV SOLN
25.0000 g | Freq: Once | INTRAVENOUS | Status: AC
  Administered 2019-03-30: 25 g via INTRAVENOUS
  Filled 2019-03-30: qty 100

## 2019-03-30 MED ORDER — DEXTROSE 50 % IV SOLN
25.0000 g | INTRAVENOUS | Status: AC
  Administered 2019-03-30: 04:00:00 25 g via INTRAVENOUS

## 2019-03-30 MED ORDER — DEXTROSE IN LACTATED RINGERS 5 % IV SOLN
INTRAVENOUS | Status: DC
  Administered 2019-03-30 – 2019-04-03 (×9): via INTRAVENOUS

## 2019-03-30 MED ORDER — MIDAZOLAM HCL 2 MG/2ML IJ SOLN
1.0000 mg | INTRAMUSCULAR | Status: DC | PRN
  Administered 2019-03-31 (×2): 1 mg via INTRAVENOUS
  Filled 2019-03-30 (×3): qty 2

## 2019-03-30 MED ORDER — MIDAZOLAM HCL 2 MG/2ML IJ SOLN
1.0000 mg | INTRAMUSCULAR | Status: AC | PRN
  Administered 2019-03-30 – 2019-03-31 (×3): 1 mg via INTRAVENOUS
  Filled 2019-03-30 (×3): qty 2

## 2019-03-30 MED ORDER — POTASSIUM CHLORIDE 10 MEQ/50ML IV SOLN
10.0000 meq | INTRAVENOUS | Status: AC
  Administered 2019-03-30 (×4): 10 meq via INTRAVENOUS
  Filled 2019-03-30 (×4): qty 50

## 2019-03-30 NOTE — Progress Notes (Signed)
Contacted MD for increased pain medication or sedation orders, PT CPOT of 5, RR of 35-36 despite Fentanyl drip. Given orders for 1 mg Versed prn.   Also asked for adjustment to levophed order to titratable rate, given new order for Levophed 0-10 mcg/min. Contacted MD again for increased maximum as patient is on 10 mcg/min with a MAP of 56. Instructed to titrate based on SBP to keep >90 rather than previous order to maintain diastolic BP [id number] and MAP 65 or higher.

## 2019-03-30 NOTE — Progress Notes (Signed)
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  Concern for GI bleed Resuscitated with 3 L IV fluids in the ED with persistent hypotension.  Elevated lactic acid noted and labs.  PCCM called for evaluation.  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  Diagnosed with an abdominal transverse colon mass with high suspicion for GI malignancy.  He has been evaluated by Dr. Johney Maine, surgery for resection.  Scheduled for colonoscopy this week.  Significant Hospital Events   10/2-admit  Consults:  GI, PCCM  Procedures:  10/2 ex lap with hemicolectomy and diverting colostomy  Significant Diagnostic Tests:  CTA 04/01/2019- no PE, right lower lobe consolidation.  1.4 cm left upper lobe nodule.  Emphysema, aortic, coronary atherosclerosis. CT head neck 04/02/2019-chronic ischemic changes.  No acute abnormalities. I have reviewed the images personally.  Micro Data:  10/1 blood >> COVID neg  Antimicrobials:  Vanc 10/1 >> Cefepime 10/1>> Flagyl 10/1>>  Interim history/subjective:  Underwent surgery last night, return to the ICU intubated. Doing well this morning, denies significant pain.  Has very dark brown, thick secretions from ET tube this morning. Objective   Blood pressure (!) 110/54, pulse 94, temperature 99.3 F (37.4 C), temperature source Oral, resp. rate 20, height 6\' 2"  (1.88 m), weight 66 kg, SpO2 100 %. CVP:  [2 mmHg-4 mmHg] 4 mmHg  Vent Mode: PSV;CPAP FiO2 (%):  [40 %-100 %] 40 % Set Rate:  [22 bmp] 22 bmp Vt Set:  [490 mL-650 mL] 490 mL PEEP:  [5 cmH20] 5  cmH20 Pressure Support:  [15 cmH20] 15 cmH20 Plateau Pressure:  [16 cmH20-25 cmH20] 18 cmH20   Intake/Output Summary (Last 24 hours) at 03/30/2019 0913 Last data filed at 03/30/2019 0800 Gross per 24 hour  Intake 9074.52 ml  Output 2125 ml  Net 6949.52 ml   Filed Weights   04/14/2019 1710 04/11/2019 1229 03/30/19 0436  Weight: 70 kg 42.1 kg 66 kg    Examination:. Gen:   Frail appearing elderly man lying in bed no acute distress.  Cachectic. HEENT: Normocephalic, atraumatic, anicteric sclera. Neck:     Supple Lungs:    Rhonchi, feculent appearing secretions from ET tube CV:         The rate and rhythm, no murmurs Abd:   Diverting ostomy pink, minimal tenderness to palpation, but not distended Ext:    Peripheral edema Skin:      Warm and dry, no rash Neuro: Awake and alert, answering questions by nodding, moving all extremities spontaneously.  Comprehension appears intact  Resolved Hospital Problem list     Assessment & Plan:  84 year old with likely colon cancer admitted with GI bleed, shock, lactic acidosis  Acute hypoxic respiratory failure requiring mechanical ventilation.  Likely had aspiration of gastric contents from small bowel obstruction prior to admission -Continue low tidal volume ventilation, 8 cc/kg -Titrate down 5 O2 as tolerated to maintain saturations greater than 90%. -Daily SAT and SBT.  Sedation appears appropriate right now.  Goal RASS 0 to -1. - We will monitor secretions from ET tube.  If this continues, may need bronchoscopy for washout.  Acute GI bleed.  Received multiple units of packed  red cells perioperatively. -Continue to follow  -Continue PPI -Appreciate GI's and surgery's  Abdominal mass, likely colon cancer.  Complicated by acute small bowel obstruction. - Pathology pending  Shock, lactic acidosis -Continue antibiotics -Continue vasopressors as required to maintain map greater than 65  Hypokalemia -Repleted  Aspiration pneumonia,   Incidental finding of lung nodule.  Possible mets from the colon -Continue antibiotics -We will monitor, but may require washout bronchoscopy  AKI-stable -Continue to monitor -Measure I/O -Maintain euvolemia  Goals of care Previously goals of care been discussed with his wife.  She is quite overwhelmed and is coming into the hospital to discuss further.  He remains full code for now.  Best practice:  Diet: NPO Pain/Anxiety/Delirium protocol (if indicated): NA VAP protocol (if indicated): NA DVT prophylaxis: SCDs GI prophylaxis: PPI Glucose control: Montor Mobility: Bed Code Status: Full Family Communication: Wife Disposition: ICU  Labs   CBC: Recent Labs  Lab 04/20/2019 1542  04/18/2019 0749 04/18/2019 1512 04/25/2019 1750 04/08/2019 2347 03/30/19 0236 03/30/19 0308  WBC 12.4*  --  12.8*  --   --  4.0 4.6  --   NEUTROABS 9.0*  --  11.0*  --   --   --   --   --   HGB 8.6*   < > 9.2* 7.8* 7.8* 12.8* 11.2* 10.5*  HCT 30.8*   < > 28.2* 23.0* 23.0* 36.1* 32.8* 31.0*  MCV 88.3  --  77.0*  --   --  80.6 81.2  --   PLT 485*  --  390  --   --  220 206  --    < > = values in this interval not displayed.    Basic Metabolic Panel: Recent Labs  Lab 04/22/2019 1542 04/13/2019 1557  04/05/2019 2001 04/17/2019 0439 04/20/2019 0730 04/16/2019 1512 04/02/2019 1750 03/30/19 0236 03/30/19 0308  NA 134* 133*   < > 134*  134* 135 135 137 139 140 140  K 3.9 3.8   < > 3.6  3.7 4.1 4.0 3.9 4.0 3.6 3.4*  CL 97* 101  --  99 101 104  --   --  110  --   CO2 10*  --   --   --  19* 19*  --   --  21*  --   GLUCOSE 301* 280*  --  165* 124* 110*  --   --  59*  --   BUN 26* 26*  --  25* 34* 38*  --   --  36*  --   CREATININE 1.64* 1.20  --  1.10 1.43* 1.52*  --   --  1.58*  --   CALCIUM 9.6  --   --   --  8.5* 8.4*  --   --  7.4*  --   MG  --   --   --   --   --   --   --   --  1.6*  --   PHOS  --   --   --   --   --   --   --   --  2.2*  --    < > = values in this interval not displayed.   GFR:  Estimated Creatinine Clearance: 33.6 mL/min (A) (by C-G formula based on SCr of 1.58 mg/dL (H)). Recent Labs  Lab 04/11/2019 1542 04/26/2019 1543 04/03/2019 1950 04/19/2019 0749 04/09/2019 1240 03/29/19 2347 03/30/19 0236 03/30/19 0332  WBC 12.4*  --   --  12.8*  --  4.0 4.6  --   LATICACIDVEN  --  >11.0* 7.8*  --  2.2*  --   --  1.9    Liver Function Tests: Recent Labs  Lab 04/24/2019 1542 03/30/2019 0730  AST 38 43*  ALT <5 21  ALKPHOS 77 61  BILITOT 0.4 0.5  PROT 7.0 6.1*  ALBUMIN 2.2* 1.9*   No results for input(s): LIPASE, AMYLASE in the last 168 hours. Recent Labs  Lab 04/03/2019 1543  AMMONIA 244*    ABG    Component Value Date/Time   PHART 7.463 (H) 03/30/2019 0308   PCO2ART 28.2 (L) 03/30/2019 0308   PO2ART 80.0 (L) 03/30/2019 0308   HCO3 20.1 03/30/2019 0308   TCO2 21 (L) 03/30/2019 0308   ACIDBASEDEF 3.0 (H) 03/30/2019 0308   O2SAT 96.0 03/30/2019 0308     Coagulation Profile: Recent Labs  Lab 04/07/2019 1542 04/24/2019 1240  INR 1.5* 1.4*    Cardiac Enzymes: No results for input(s): CKTOTAL, CKMB, CKMBINDEX, TROPONINI in the last 168 hours.  HbA1C: Hgb A1c MFr Bld  Date/Time Value Ref Range Status  04/02/2019 07:49 AM 5.6 4.8 - 5.6 % Final    Comment:    (NOTE) Pre diabetes:          5.7%-6.4% Diabetes:              >6.4% Glycemic control for   <7.0% adults with diabetes     CBG: Recent Labs  Lab 03/31/2019 2348 03/30/19 0345 03/30/19 0349 03/30/19 0405 03/30/19 0735  GLUCAP 74 47* 50* 191* 80    This patient is critically ill with multiple organ system failure which requires frequent high complexity decision making, assessment, support, evaluation, and titration of therapies. This was completed through the application of advanced monitoring technologies and extensive interpretation of multiple databases. During this encounter critical care time was devoted to patient care services described in this note for 45 minutes.  Julian Hy, DO  03/30/19 11:56 AM [facility]

## 2019-03-30 NOTE — Progress Notes (Addendum)
eLink Physician-Brief Progress Note Patient Name: Darryl Johns DOB: [birth year] MRN: [id number]   Date of Service  03/30/2019  HPI/Events of Note  Notified of oliguria, hypotension and tachycardia. OGT output 500 cc. Proceeded with 1 liter LR bolus with decrease in HR. Still on norepinephrine and vasopressin.  eICU Interventions  Will give another 500 cc LR bolus Albumin ordered as well Target MAP 99991111 and/or diastolic [id number]     Intervention Category Major Interventions: Hypotension - evaluation and management  Shona Needles Tyona Nilsen 03/30/2019, 2:58 AM

## 2019-03-30 NOTE — Progress Notes (Signed)
1 Day Post-Op   Subjective/Chief Complaint: On vent POD#1   Objective: Vital signs in last 24 hours: Temp:  [97.7 F (36.5 C)-100.6 F (38.1 C)] 99.3 F (37.4 C) (10/03 0800) Pulse Rate:  [94-150] 94 (10/03 0800) Resp:  [20-34] 20 (10/03 0800) BP: (66-146)/(40-85) 110/54 (10/03 0800) SpO2:  [81 %-100 %] 100 % (10/03 0800) Arterial Line BP: (89-167)/(31-49) 149/41 (10/03 0800) FiO2 (%):  [40 %-100 %] 40 % (10/03 0800) Weight:  [42.1 kg-66 kg] 66 kg (10/03 0436) Last BM Date: 03/30/19  Intake/Output from previous day: 10/02 0701 - 10/03 0700 In: 8840.8 [I.V.:3465.7; Blood:315; IV Piggyback:5060.1] Out: 1900 [Urine:1300; Emesis/NG output:500; Stool:25; Blood:75] Intake/Output this shift: Total I/O In: 233.8 [I.V.:173.8; IV Piggyback:60] Out: 225 [Urine:175; Emesis/NG output:50]  Exam: On vent, opens eyes Abdomen soft, VAC in place, ileostomy pink  Lab Results:  Recent Labs    04/19/2019 2347 03/30/19 0236 03/30/19 0308  WBC 4.0 4.6  --   HGB 12.8* 11.2* 10.5*  HCT 36.1* 32.8* 31.0*  PLT 220 206  --    BMET Recent Labs    04/16/2019 0730  03/30/19 0236 03/30/19 0308  NA 135   < > 140 140  K 4.0   < > 3.6 3.4*  CL 104  --  110  --   CO2 19*  --  21*  --   GLUCOSE 110*  --  59*  --   BUN 38*  --  36*  --   CREATININE 1.52*  --  1.58*  --   CALCIUM 8.4*  --  7.4*  --    < > = values in this interval not displayed.   PT/INR Recent Labs    04/21/2019 1542 04/09/2019 1240  LABPROT 17.7* 17.4*  INR 1.5* 1.4*   ABG Recent Labs    04/09/2019 1750 03/30/19 0308  PHART 7.343* 7.463*  HCO3 20.7 20.1    Studies/Results: Ct Head Wo Contrast  Result Date: 04/08/2019 CLINICAL DATA:  Trauma.  Altered mental status.  Fall. EXAM: CT HEAD WITHOUT CONTRAST CT CERVICAL SPINE WITHOUT CONTRAST TECHNIQUE: Multidetector CT imaging of the head and cervical spine was performed following the standard protocol without intravenous contrast. Multiplanar CT image reconstructions of  the cervical spine were also generated. COMPARISON:  None. FINDINGS: CT HEAD FINDINGS Brain: No evidence of acute infarction, hemorrhage, hydrocephalus, extra-axial collection or mass lesion/mass effect. There is ventricular and sulcal enlargement reflecting mild diffuse atrophy. White matter hypoattenuation is noted bilaterally consistent with advanced chronic microvascular ischemic change. 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 There is a focus of calcification in the right pons. Vascular: No hyperdense vessel or unexpected calcification. Skull: Normal. Negative for fracture or focal lesion. Sinuses/Orbits: Globes and orbits are unremarkable. Sinuses and mastoid air cells are clear. Other: None. CT CERVICAL SPINE FINDINGS Alignment: Normal. Skull base and vertebrae: No acute fracture. No primary bone lesion or focal pathologic process. Soft tissues and spinal canal: No prevertebral fluid or swelling. No visible canal hematoma. Oval low-attenuation soft tissue mass in the subcutaneous soft tissues of the left posterior neck measuring 2.4 cm, consistent with a sebaceous or inclusion cyst. Disc levels: Moderate loss of disc height at C4-C5 and C6-C7 with spondylotic disc bulging and endplate spurring. Facet degenerative changes are noted bilaterally, greatest on the right at C5-C6. No evidence of a disc herniation. Upper chest: No acute findings. Other: None. IMPRESSION: HEAD CT 1. No acute intracranial abnormalities. 2. No skull fracture. 3. Moderate chronic microvascular ischemic  change. CERVICAL CT 1. No fracture or acute finding. Electronically Signed   By: Lajean Manes M.D.   On: 04/11/2019 18:18   Ct Angio Chest Pe W And/or Wo Contrast  Result Date: 04/27/2019 CLINICAL DATA:  84 year old male with clinical suspicion of pulmonary embolism. High pretest probability. EXAM: CT ANGIOGRAPHY CHEST WITH CONTRAST TECHNIQUE: Multidetector CT imaging of the chest was performed using the standard protocol  during bolus administration of intravenous contrast. Multiplanar CT image reconstructions and MIPs were obtained to evaluate the vascular anatomy. CONTRAST:  126mL OMNIPAQUE IOHEXOL 350 MG/ML SOLN COMPARISON:  No priors. FINDINGS: Cardiovascular: No filling defects within the pulmonary arterial tree to suggest underlying pulmonary embolism. Heart size is normal. There is no significant pericardial fluid, thickening or pericardial calcification. There is aortic atherosclerosis, as well as atherosclerosis of the great vessels of the mediastinum and the coronary arteries, including calcified atherosclerotic plaque in the left main, left anterior descending, left circumflex and right coronary arteries. Mediastinum/Nodes: No pathologically enlarged mediastinal or hilar lymph nodes. Patulous esophagus which is partially fluid filled. No axillary lymphadenopathy. Lungs/Pleura: Patchy multifocal airspace consolidation in the right lower lobe dependently, potentially sequela of aspiration. In the medial aspect of the left upper lobe (axial image 37 of series 7) there is a well-circumscribed pulmonary nodule measuring 1.2 x 1.4 cm. No other definite suspicious appearing pulmonary nodules or masses are noted. Mild diffuse bronchial wall thickening with mild centrilobular and paraseptal emphysema. No pleural effusions. Upper Abdomen: Aortic atherosclerosis. Pneumobilia, presumably from prior sphincterotomy. Small volume of ascites noted adjacent to the liver and spleen. Aortic atherosclerosis. Musculoskeletal: There are no aggressive appearing lytic or blastic lesions noted in the visualized portions of the skeleton. Review of the MIP images confirms the above findings. IMPRESSION: 1. No evidence of pulmonary embolism. 2. Dependent areas of airspace consolidation in the right lower lobe, concerning for sequela of aspiration. 3. 1.2 x 1.4 cm well-circumscribed nodule in the left upper lobe. Consider one of the following in 3  months for both low-risk and high-risk individuals: (a) repeat chest CT or (b) follow-up PET-CT. This recommendation follows the consensus statement: Guidelines for Management of Incidental Pulmonary Nodules Detected on CT Images: From the Fleischner Society 2017; Radiology 2017; 284:228-243. 4. Mild diffuse bronchial wall thickening with mild centrilobular and paraseptal emphysema; imaging findings suggestive of underlying COPD. 5. Aortic atherosclerosis, in addition to left main and 3 vessel coronary artery disease. 6. Small volume of ascites. Aortic Atherosclerosis (ICD10-I70.0) and Emphysema (ICD10-J43.9). Electronically Signed   By: Vinnie Langton M.D.   On: 04/23/2019 18:21   Ct Cervical Spine Wo Contrast  Result Date: 04/24/2019 CLINICAL DATA:  Trauma.  Altered mental status.  Fall. EXAM: CT HEAD WITHOUT CONTRAST CT CERVICAL SPINE WITHOUT CONTRAST TECHNIQUE: Multidetector CT imaging of the head and cervical spine was performed following the standard protocol without intravenous contrast. Multiplanar CT image reconstructions of the cervical spine were also generated. COMPARISON:  None. FINDINGS: CT HEAD FINDINGS Brain: No evidence of acute infarction, hemorrhage, hydrocephalus, extra-axial collection or mass lesion/mass effect. There is ventricular and sulcal enlargement reflecting mild diffuse atrophy. White matter hypoattenuation is noted bilaterally consistent with advanced chronic microvascular ischemic change. 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 There is a focus of calcification in the right pons. Vascular: No hyperdense vessel or unexpected calcification. Skull: Normal. Negative for fracture or focal lesion. Sinuses/Orbits: Globes and orbits are unremarkable. Sinuses and mastoid air cells are clear. Other: None. CT CERVICAL SPINE FINDINGS Alignment: Normal.  Skull base and vertebrae: No acute fracture. No primary bone lesion or focal pathologic process. Soft tissues and spinal canal: No  prevertebral fluid or swelling. No visible canal hematoma. Oval low-attenuation soft tissue mass in the subcutaneous soft tissues of the left posterior neck measuring 2.4 cm, consistent with a sebaceous or inclusion cyst. Disc levels: Moderate loss of disc height at C4-C5 and C6-C7 with spondylotic disc bulging and endplate spurring. Facet degenerative changes are noted bilaterally, greatest on the right at C5-C6. No evidence of a disc herniation. Upper chest: No acute findings. Other: None. IMPRESSION: HEAD CT 1. No acute intracranial abnormalities. 2. No skull fracture. 3. Moderate chronic microvascular ischemic change. CERVICAL CT 1. No fracture or acute finding. Electronically Signed   By: Lajean Manes M.D.   On: 03/31/2019 18:18   Ct Abdomen Pelvis W Contrast  Result Date: 04/26/2019 CLINICAL DATA:  Abdominal mass, nonpulsatile and palpable, known transverse colon mass. EXAM: CT ABDOMEN AND PELVIS WITH CONTRAST TECHNIQUE: Multidetector CT imaging of the abdomen and pelvis was performed using the standard protocol following bolus administration of intravenous contrast. CONTRAST:  72mL OMNIPAQUE IOHEXOL 300 MG/ML  SOLN COMPARISON:  None. FINDINGS: Lower chest: Dense basilar consolidation on the right with patchy opacities and small right effusion. Also with scattered opacities in the inferior left chest. Hepatobiliary: No signs of focal hepatic lesion. Gallbladder distended without signs of pericholecystic inflammation. No biliary ductal dilation. Pancreas: Pancreatic atrophy, mild, no ductal dilation or acute process. Spleen: Normal appearance of the spleen. Adrenals/Urinary Tract: Mild bilateral adrenal thickening, nonspecific. Signs of excreted contrast with symmetric renal enhancement, no renal mass. No hydronephrosis. Stomach/Bowel: Signs of obstructing mass in the mid transverse colon, circumferential mass measuring approximately 7.9 x 4.8 cm in greatest axial dimension with wall thickness of 2.5 to 3  cm. Mass is obstructing in adjacent small bowel loop in the jejunum which it invades, best seen in the coronal plane. Small bowel loops in the right hemiabdomen show mural stratification, wall thickening and some areas of nonenhancement, best seen on axial dataset, image 57 of series 3 the more posteriorly placed small bowel loop does not appear to show enhancement to the same extent as surrounding small bowel loops. The duodenum does not cross midline in this patient. There is a partial mesenteric twist. No signs of free air. The appendix is normal. There is free fluid also along the right flank tracking into the pelvis, right upper quadrant and to a lesser extent across the abdomen into the left hemiabdomen. Question thickening also of the descending colon at the descending/sigmoid junction. There is marked gastric distention with distended patulous esophagus above. Vascular/Lymphatic: No signs of acute vascular process. Moderate calcific atherosclerotic changes throughout the abdominal aorta extending the iliac vessels. Scattered lymph nodes are seen in the abdomen. No signs of pelvic adenopathy. Reproductive: Prostate heterogeneous. Foley catheter in the urinary bladder. Other: No signs of free air with fluid throughout the abdomen as discussed. Some nodularity adjacent to the primary mass in the colon. Musculoskeletal: No signs of acute bone finding or destructive bone process. IMPRESSION: 1. Small-bowel obstruction with signs of bowel ischemia secondary to colonic mass with involvement of adjacent small bowel. 2. Small bowel loops in the right hemiabdomen are jejunal. 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 3. No signs of free air at this time though bowel with this appearance is at risk for perforation. 4. Signs of bilateral pneumonia and/or aspiration related changes.  5. Free fluid without abscess is likely related to ischemic enteritis. 6.  These results were called by telephone at the time of interpretation on 04/03/2019 at 12:55 pm to provider [facility] , who verbally acknowledged these results. Electronically Signed   By: Zetta Bills M.D.   On: 04/03/2019 12:56   Portable Chest X-ray  Result Date: 04/07/2019 CLINICAL DATA:  Verify endotracheal tube placement EXAM: PORTABLE CHEST 1 VIEW COMPARISON:  04/15/2019 FINDINGS: Endotracheal tube with the tip 2 cm above the carina. Nasogastric tube with the tip projecting over the stomach. Right subclavian central venous catheter with the tip projecting over the cavoatrial junction. Bilateral diffuse interstitial thickening. No pleural effusion or pneumothorax. Stable cardiomediastinal silhouette. No acute osseous abnormality. Gaseous distension of the colon. IMPRESSION: 1. Endotracheal tube with the tip 2 cm above the carina. 2. Nasogastric tube with the tip projecting over the stomach. Electronically Signed   By: Kathreen Devoid   On: 04/15/2019 15:12   Dg Chest Portable 1 View  Result Date: 04/20/2019 CLINICAL DATA:  Shortness of breath EXAM: PORTABLE CHEST 1 VIEW COMPARISON:  08/25/2009 FINDINGS: Heart is normal size. Low volumes. Bibasilar opacities likely reflect atelectasis. Aortic atherosclerosis. No visible effusions or acute bony abnormality. IMPRESSION: Low lung volumes with bibasilar atelectasis. Electronically Signed   By: Rolm Baptise M.D.   On: 04/03/2019 16:05    Anti-infectives: Anti-infectives (From admission, onward)   Start     Dose/Rate Route Frequency Ordered Stop   03/30/19 0600  cefoTEtan (CEFOTAN) 2 g in sodium chloride 0.9 % 100 mL IVPB    Note to Pharmacy: Pharmacy may adjust dose strength for optimal dosing.   Send with patient on call to the OR.  Anesthesia to complete antibiotic administration <31min prior to incision per [facility].   2 g 200 mL/hr over 30 Minutes Intravenous To Short Stay 04/23/2019 1335 04/14/2019 1559   04/02/2019 2200  cefoTEtan (CEFOTAN)  2 g in sodium chloride 0.9 % 100 mL IVPB     2 g 200 mL/hr over 30 Minutes Intravenous Every 12 hours 04/13/2019 1902 03/30/2019 2214   03/28/2019 1730  vancomycin (VANCOCIN) 1,250 mg in sodium chloride 0.9 % 250 mL IVPB  Status:  Discontinued     1,250 mg 166.7 mL/hr over 90 Minutes Intravenous Every 24 hours 04/24/2019 1720 04/16/2019 0928   04/26/2019 1400  neomycin (MYCIFRADIN) tablet 1,000 mg  Status:  Discontinued     1,000 mg Oral 3 times per day 04/01/2019 0620 04/10/2019 0839   04/04/2019 1400  metroNIDAZOLE (FLAGYL) tablet 1,000 mg  Status:  Discontinued     1,000 mg Oral 3 times per day 04/21/2019 0620 03/28/2019 0839   04/23/2019 1013  vancomycin variable dose per unstable renal function (pharmacist dosing)  Status:  Discontinued      Does not apply See admin instructions 04/04/2019 1014 03/28/2019 1908   04/08/2019 0930  metroNIDAZOLE (FLAGYL) IVPB 500 mg     500 mg 100 mL/hr over 60 Minutes Intravenous Every 8 hours 04/25/2019 0920     04/02/2019 0630  cefoTEtan (CEFOTAN) 2 g in sodium chloride 0.9 % 100 mL IVPB  Status:  Discontinued     2 g 200 mL/hr over 30 Minutes Intravenous On call to O.R. 04/15/2019 [id number] 04/05/2019 0839   04/01/2019 0630  clindamycin (CLEOCIN) 900 mg, gentamicin (GARAMYCIN) 240 mg in sodium chloride 0.9 % 1,000 mL for intraperitoneal lavage  Status:  Discontinued      Irrigation To Surgery 04/21/2019 [id number] 04/21/2019  0840   04/18/2019 0500  ceFEPIme (MAXIPIME) 2 g in sodium chloride 0.9 % 100 mL IVPB     2 g 200 mL/hr over 30 Minutes Intravenous Every 12 hours 04/05/2019 1720     03/29/2019 1630  ceFEPIme (MAXIPIME) 2 g in sodium chloride 0.9 % 100 mL IVPB     2 g 200 mL/hr over 30 Minutes Intravenous  Once 04/05/2019 1617 03/31/2019 1804   03/30/2019 1630  metroNIDAZOLE (FLAGYL) IVPB 500 mg     500 mg 100 mL/hr over 60 Minutes Intravenous  Once 04/21/2019 1617 04/03/2019 1928   04/22/2019 1630  vancomycin (VANCOCIN) IVPB 1000 mg/200 mL premix  Status:  Discontinued     1,000 mg 200 mL/hr over 60 Minutes  Intravenous  Once 04/23/2019 1617 04/03/2019 1626   04/05/2019 1630  vancomycin (VANCOCIN) 1,500 mg in sodium chloride 0.9 % 500 mL IVPB     1,500 mg 250 mL/hr over 120 Minutes Intravenous  Once 04/07/2019 1626 04/14/2019 2157      Assessment/Plan: s/p Procedure(s): EXPLORATORY LAPAROTOMY, PARTIAL EXTENDED RIGHT COLECTOMY, JEJUNOSTOMY Ileostomy Application Of Wound Vac  Continue NPO Wound Vac Awaiting path   LOS: 2 days    Coralie Keens 03/30/2019

## 2019-03-30 NOTE — Progress Notes (Signed)
eLink Physician-Brief Progress Note Patient Name: Darryl Johns DOB: 1934-11-29 MRN: [id number]   Date of Service  03/30/2019  HPI/Events of Note  Notified of hypoglycemia on spot check. Remains NPO  eICU Interventions  Switched LR to D5 LR Ordered CBG q 4     Intervention Category Intermediate Interventions: Other:  Judd Lien 03/30/2019, 4:33 AM

## 2019-03-30 NOTE — Progress Notes (Signed)
Came to room d/t vent alarming high RR (35-40).  PSV increased to 15, RR dropped to 30.  No distress noted, VSS,sat 99%

## 2019-03-30 NOTE — Progress Notes (Signed)
Pt changed back to full support d/t high RR 35-40 and RN has already given sedation.  Pt appears to be tol PRVC well currently.  Sat 98%

## 2019-03-31 DIAGNOSIS — E43 Unspecified severe protein-calorie malnutrition: Secondary | ICD-10-CM

## 2019-03-31 DIAGNOSIS — Z9911 Dependence on respirator [ventilator] status: Secondary | ICD-10-CM

## 2019-03-31 DIAGNOSIS — K6389 Other specified diseases of intestine: Secondary | ICD-10-CM

## 2019-03-31 DIAGNOSIS — R64 Cachexia: Secondary | ICD-10-CM

## 2019-03-31 LAB — CBC
HCT: 31.1 % — ABNORMAL LOW (ref 39.0–52.0)
Hemoglobin: 10.7 g/dL — ABNORMAL LOW (ref 13.0–17.0)
MCH: 28.2 pg (ref 26.0–34.0)
MCHC: 34.4 g/dL (ref 30.0–36.0)
MCV: 82.1 fL (ref 80.0–100.0)
Platelets: 205 10*3/uL (ref 150–400)
RBC: 3.79 MIL/uL — ABNORMAL LOW (ref 4.22–5.81)
RDW: 18.1 % — ABNORMAL HIGH (ref 11.5–15.5)
WBC: 17.8 10*3/uL — ABNORMAL HIGH (ref 4.0–10.5)
nRBC: 0 % (ref 0.0–0.2)

## 2019-03-31 LAB — BASIC METABOLIC PANEL
Anion gap: 8 (ref 5–15)
Anion gap: 5 (ref 5–15)
BUN: 23 mg/dL (ref 8–23)
BUN: 17 mg/dL (ref 8–23)
CO2: 19 mmol/L — ABNORMAL LOW (ref 22–32)
CO2: 22 mmol/L (ref 22–32)
Calcium: 7.7 mg/dL — ABNORMAL LOW (ref 8.9–10.3)
Calcium: 8 mg/dL — ABNORMAL LOW (ref 8.9–10.3)
Chloride: 110 mmol/L (ref 98–111)
Chloride: 114 mmol/L — ABNORMAL HIGH (ref 98–111)
Creatinine, Ser: 1.23 mg/dL (ref 0.61–1.24)
Creatinine, Ser: 1.06 mg/dL (ref 0.61–1.24)
GFR calc Af Amer: >60 mL/min (ref >60)
GFR calc Af Amer: >60 mL/min (ref >60)
GFR calc non Af Amer: 54 mL/min — ABNORMAL LOW (ref >60)
GFR calc non Af Amer: >60 mL/min (ref >60)
Glucose, Bld: 84 mg/dL (ref 70–99)
Glucose, Bld: 100 mg/dL — ABNORMAL HIGH (ref 70–99)
Potassium: 4.1 mmol/L (ref 3.5–5.1)
Potassium: 3 mmol/L — ABNORMAL LOW (ref 3.5–5.1)
Sodium: 137 mmol/L (ref 135–145)
Sodium: 141 mmol/L (ref 135–145)

## 2019-03-31 LAB — GLUCOSE, CAPILLARY
Glucose-Capillary: 100 mg/dL — ABNORMAL HIGH (ref 70–99)
Glucose-Capillary: 85 mg/dL (ref 70–99)
Glucose-Capillary: 87 mg/dL (ref 70–99)
Glucose-Capillary: 91 mg/dL (ref 70–99)
Glucose-Capillary: 91 mg/dL (ref 70–99)
Glucose-Capillary: 93 mg/dL (ref 70–99)

## 2019-03-31 LAB — HEMOGLOBIN AND HEMATOCRIT, BLOOD
HCT: 28.5 % — ABNORMAL LOW (ref 39.0–52.0)
Hemoglobin: 10.1 g/dL — ABNORMAL LOW (ref 13.0–17.0)

## 2019-03-31 LAB — MAGNESIUM
Magnesium: 1.7 mg/dL (ref 1.7–2.4)
Magnesium: 2.2 mg/dL (ref 1.7–2.4)

## 2019-03-31 MED ORDER — MIDAZOLAM HCL 2 MG/2ML IJ SOLN
1.0000 mg | INTRAMUSCULAR | Status: DC | PRN
  Administered 2019-03-31 – 2019-04-02 (×5): 1 mg via INTRAVENOUS
  Filled 2019-03-31 (×5): qty 2

## 2019-03-31 MED ORDER — POTASSIUM CHLORIDE 10 MEQ/50ML IV SOLN
10.0000 meq | INTRAVENOUS | Status: AC
  Administered 2019-03-31 – 2019-04-01 (×4): 10 meq via INTRAVENOUS
  Filled 2019-03-31 (×4): qty 50

## 2019-03-31 MED ORDER — FENTANYL CITRATE (PF) 100 MCG/2ML IJ SOLN
25.0000 ug | INTRAMUSCULAR | Status: DC | PRN
  Administered 2019-03-31 – 2019-04-01 (×5): 100 ug via INTRAVENOUS
  Administered 2019-04-01: 50 ug via INTRAVENOUS
  Administered 2019-04-01 (×2): 100 ug via INTRAVENOUS
  Administered 2019-04-01: 25 ug via INTRAVENOUS
  Administered 2019-04-01 (×2): 100 ug via INTRAVENOUS
  Administered 2019-04-03: 22:00:00 50 ug via INTRAVENOUS

## 2019-03-31 MED ORDER — SODIUM CHLORIDE 0.9 % IV BOLUS
1000.0000 mL | Freq: Once | INTRAVENOUS | Status: AC
  Administered 2019-04-01: 1000 mL via INTRAVENOUS

## 2019-03-31 MED ORDER — MAGNESIUM SULFATE 2 GM/50ML IV SOLN
2.0000 g | Freq: Once | INTRAVENOUS | Status: AC
  Administered 2019-03-31: 2 g via INTRAVENOUS
  Filled 2019-03-31: qty 50

## 2019-03-31 MED ORDER — LACTATED RINGERS IV BOLUS
500.0000 mL | Freq: Once | INTRAVENOUS | Status: AC
  Administered 2019-03-31: 23:00:00 500 mL via INTRAVENOUS

## 2019-03-31 NOTE — Progress Notes (Signed)
eLink Physician-Brief Progress Note Patient Name: Darryl Johns DOB: 1935-06-07 MRN: [id number]   Date of Service  03/31/2019  HPI/Events of Note  Nursing now reports that CVP = 1.  Last LVEF = 65-70%  eICU Interventions  Will order: 1. Bolus with 0.9 NaCl 1 liter IV over 1 hour now.      Intervention Category Major Interventions: Hypotension - evaluation and management  [name],[name] 03/31/2019, 11:59 PM

## 2019-03-31 NOTE — Progress Notes (Signed)
Called to patient bedside by RN.  Stated that patient had desat into the 67s.  Changed FIO2 from 25% to 30%, patient came right back up then proceeded to go back down from upper 90s to 90%.  Suctioned patient and got a mucus plug out, but patient continued to desat.  Raised FIO2 up to 40%.  Patient now at 100%.  Will continue to monitor.

## 2019-03-31 NOTE — Progress Notes (Signed)
Patient ID: Darryl Johns, male   DOB: 30-Oct-1934, 84 y.o.   MRN: [id number] 2 Days Post-Op   Subjective: On vent Objective: Vital signs in last 24 hours: Temp:  [97.7 F (36.5 C)-101.2 F (38.4 C)] 97.7 F (36.5 C) (10/04 0719) Pulse Rate:  [66-137] 76 (10/04 0830) Resp:  [13-35] 22 (10/04 0830) BP: (90-171)/(40-97) 104/51 (10/04 0830) SpO2:  [89 %-100 %] 100 % (10/04 0815) Arterial Line BP: (59-183)/(33-120) 72/50 (10/04 0245) FiO2 (%):  [25 %-40 %] 30 % (10/04 0726) Weight:  [67.1 kg] 67.1 kg (10/04 0335) Last BM Date: 03/30/19  Intake/Output from previous day: 10/03 0701 - 10/04 0700 In: 5231.3 [I.V.:4490.4; IV Piggyback:740.8] Out: 2765 [Urine:2100; Emesis/NG output:490; Stool:175] Intake/Output this shift: Total I/O In: 115.1 [I.V.:115.1] Out: -   General appearance: no distress Resp: few rales Cardio: irreg GI: midline VAC, ileostomy pink with some liquid out  Lab Results: CBC  Recent Labs    03/30/19 1439 03/31/19 0148  WBC 11.1* 17.8*  HGB 10.8* 10.7*  HCT 29.5* 31.1*  PLT 210 205   BMET Recent Labs    03/30/19 0236 03/30/19 0308 03/31/19 0148  NA 140 140 137  K 3.6 3.4* 4.1  CL 110  --  110  CO2 21*  --  19*  GLUCOSE 59*  --  84  BUN 36*  --  23  CREATININE 1.58*  --  1.23  CALCIUM 7.4*  --  7.7*   PT/INR Recent Labs    04/08/2019 1542 04/16/2019 1240  LABPROT 17.7* 17.4*  INR 1.5* 1.4*   ABG Recent Labs    04/04/2019 1750 03/30/19 0308  PHART 7.343* 7.463*  HCO3 20.7 20.1    Studies/Results: Ct Abdomen Pelvis W Contrast  Result Date: 04/08/2019 CLINICAL DATA:  Abdominal mass, nonpulsatile and palpable, known transverse colon mass. EXAM: CT ABDOMEN AND PELVIS WITH CONTRAST TECHNIQUE: Multidetector CT imaging of the abdomen and pelvis was performed using the standard protocol following bolus administration of intravenous contrast. CONTRAST:  41mL OMNIPAQUE IOHEXOL 300 MG/ML  SOLN COMPARISON:  None. FINDINGS: Lower chest: Dense basilar  consolidation on the right with patchy opacities and small right effusion. Also with scattered opacities in the inferior left chest. Hepatobiliary: No signs of focal hepatic lesion. Gallbladder distended without signs of pericholecystic inflammation. No biliary ductal dilation. Pancreas: Pancreatic atrophy, mild, no ductal dilation or acute process. Spleen: Normal appearance of the spleen. Adrenals/Urinary Tract: Mild bilateral adrenal thickening, nonspecific. Signs of excreted contrast with symmetric renal enhancement, no renal mass. No hydronephrosis. Stomach/Bowel: Signs of obstructing mass in the mid transverse colon, circumferential mass measuring approximately 7.9 x 4.8 cm in greatest axial dimension with wall thickness of 2.5 to 3 cm. Mass is obstructing in adjacent small bowel loop in the jejunum which it invades, best seen in the coronal plane. Small bowel loops in the right hemiabdomen show mural stratification, wall thickening and some areas of nonenhancement, best seen on axial dataset, image 57 of series 3 the more posteriorly placed small bowel loop does not appear to show enhancement to the same extent as surrounding small bowel loops. The duodenum does not cross midline in this patient. There is a partial mesenteric twist. No signs of free air. The appendix is normal. There is free fluid also along the right flank tracking into the pelvis, right upper quadrant and to a lesser extent across the abdomen into the left hemiabdomen. Question thickening also of the descending colon at the descending/sigmoid junction. There is marked gastric distention  with distended patulous esophagus above. Vascular/Lymphatic: No signs of acute vascular process. Moderate calcific atherosclerotic changes throughout the abdominal aorta extending the iliac vessels. Scattered lymph nodes are seen in the abdomen. No signs of pelvic adenopathy. Reproductive: Prostate heterogeneous. Foley catheter in the urinary bladder. Other:  No signs of free air with fluid throughout the abdomen as discussed. Some nodularity adjacent to the primary mass in the colon. Musculoskeletal: No signs of acute bone finding or destructive bone process. IMPRESSION: 1. Small-bowel obstruction with signs of bowel ischemia secondary to colonic mass with involvement of adjacent small bowel. 2. Small bowel loops in the right hemiabdomen are jejunal. 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 3. No signs of free air at this time though bowel with this appearance is at risk for perforation. 4. Signs of bilateral pneumonia and/or aspiration related changes. 5. Free fluid without abscess is likely related to ischemic enteritis. 6. These results were called by telephone at the time of interpretation on 04/06/2019 at 12:55 pm to provider [facility] , who verbally acknowledged these results. Electronically Signed   By: Zetta Bills M.D.   On: 04/11/2019 12:56   Portable Chest X-ray  Result Date: 04/07/2019 CLINICAL DATA:  Verify endotracheal tube placement EXAM: PORTABLE CHEST 1 VIEW COMPARISON:  04/09/2019 FINDINGS: Endotracheal tube with the tip 2 cm above the carina. Nasogastric tube with the tip projecting over the stomach. Right subclavian central venous catheter with the tip projecting over the cavoatrial junction. Bilateral diffuse interstitial thickening. No pleural effusion or pneumothorax. Stable cardiomediastinal silhouette. No acute osseous abnormality. Gaseous distension of the colon. IMPRESSION: 1. Endotracheal tube with the tip 2 cm above the carina. 2. Nasogastric tube with the tip projecting over the stomach. Electronically Signed   By: Kathreen Devoid   On: 04/10/2019 15:12    Anti-infectives: Anti-infectives (From admission, onward)   Start     Dose/Rate Route Frequency Ordered Stop   03/30/19 0600  cefoTEtan (CEFOTAN) 2 g in sodium chloride 0.9 % 100 mL IVPB    Note to Pharmacy:  Pharmacy may adjust dose strength for optimal dosing.   Send with patient on call to the OR.  Anesthesia to complete antibiotic administration <75min prior to incision per [facility].   2 g 200 mL/hr over 30 Minutes Intravenous To Short Stay 04/18/2019 1335 04/16/2019 1559   03/28/2019 2200  cefoTEtan (CEFOTAN) 2 g in sodium chloride 0.9 % 100 mL IVPB     2 g 200 mL/hr over 30 Minutes Intravenous Every 12 hours 04/27/2019 1902 04/02/2019 2214   04/24/2019 1730  vancomycin (VANCOCIN) 1,250 mg in sodium chloride 0.9 % 250 mL IVPB  Status:  Discontinued     1,250 mg 166.7 mL/hr over 90 Minutes Intravenous Every 24 hours 04/15/2019 1720 04/14/2019 0928   03/28/2019 1400  neomycin (MYCIFRADIN) tablet 1,000 mg  Status:  Discontinued     1,000 mg Oral 3 times per day 04/22/2019 0620 04/08/2019 0839   04/14/2019 1400  metroNIDAZOLE (FLAGYL) tablet 1,000 mg  Status:  Discontinued     1,000 mg Oral 3 times per day 04/27/2019 0620 04/17/2019 0839   04/14/2019 1013  vancomycin variable dose per unstable renal function (pharmacist dosing)  Status:  Discontinued      Does not apply See admin instructions 04/10/2019 1014 04/19/2019 1908   04/18/2019 0930  metroNIDAZOLE (FLAGYL) IVPB 500 mg     500 mg 100 mL/hr over 60 Minutes Intravenous Every  8 hours 04/05/2019 0920     04/07/2019 0630  cefoTEtan (CEFOTAN) 2 g in sodium chloride 0.9 % 100 mL IVPB  Status:  Discontinued     2 g 200 mL/hr over 30 Minutes Intravenous On call to O.R. 04/08/2019 [id number] 04/23/2019 0839   04/10/2019 0630  clindamycin (CLEOCIN) 900 mg, gentamicin (GARAMYCIN) 240 mg in sodium chloride 0.9 % 1,000 mL for intraperitoneal lavage  Status:  Discontinued      Irrigation To Surgery 04/11/2019 0620 04/15/2019 0840   04/11/2019 0500  ceFEPIme (MAXIPIME) 2 g in sodium chloride 0.9 % 100 mL IVPB     2 g 200 mL/hr over 30 Minutes Intravenous Every 12 hours 04/17/2019 1720     04/11/2019 1630  ceFEPIme (MAXIPIME) 2 g in sodium chloride 0.9 % 100 mL IVPB     2 g 200 mL/hr over 30 Minutes  Intravenous  Once 04/01/2019 1617 04/07/2019 1804   04/06/2019 1630  metroNIDAZOLE (FLAGYL) IVPB 500 mg     500 mg 100 mL/hr over 60 Minutes Intravenous  Once 04/13/2019 1617 04/11/2019 1928   04/05/2019 1630  vancomycin (VANCOCIN) IVPB 1000 mg/200 mL premix  Status:  Discontinued     1,000 mg 200 mL/hr over 60 Minutes Intravenous  Once 04/23/2019 1617 04/25/2019 1626   04/14/2019 1630  vancomycin (VANCOCIN) 1,500 mg in sodium chloride 0.9 % 500 mL IVPB     1,500 mg 250 mL/hr over 120 Minutes Intravenous  Once 04/20/2019 1626 04/25/2019 2157      Assessment/Plan: s/p Procedure(s): EXPLORATORY LAPAROTOMY, PARTIAL EXTENDED RIGHT COLECTOMY, JEJUNOSTOMY Ileostomy Application Of Wound Vac  POD#2 He has a very proximal SB anastamosis so no tube feeds yet Wound VAC on midline Await path Appreciate CCM management  LOS: 3 days    Georganna Skeans, MD, MPH, FACS Trauma & General Surgery Use [email or web address] to contact on call provider  03/31/2019

## 2019-03-31 NOTE — Progress Notes (Signed)
eLink Physician-Brief Progress Note Patient Name: [name] DOB: 1935/05/21 MRN: [id number]   Date of Service  03/31/2019  HPI/Events of Note  Elevated HR - Sinus tachycardia vs SVT - HR = 145 and BP = 101/61 with MAP = 73.   eICU Interventions  Will order: 1. Please do CVP now. 2. BMP and Mg++ level STAT. 3. 12 Lead EKG STAT.     Intervention Category Major Interventions: Arrhythmia - evaluation and management  [name],[name] 03/31/2019, 8:42 PM

## 2019-03-31 NOTE — Progress Notes (Signed)
NAME:  Darryl Johns, MRN:  [id number], DOB:  1934-12-18, LOS: 3 ADMISSION DATE:  04/08/2019, CONSULTATION DATE:   REFERRING MD: Velna Ochs MD, CHIEF COMPLAINT:  GI bleed  Brief History   84 year old with RBBB, peripheral neuropathy, orthostatic hypotension and suspected transverse colon cancer admitted with fall, syncope, hematemesis, melena, shock.  Concern for GI bleed Resuscitated with 3 L IV fluids in the ED with persistent hypotension.  Elevated lactic acid noted and labs.  PCCM called for evaluation.  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  Diagnosed with an abdominal transverse colon mass with high suspicion for GI malignancy.  He has been evaluated by Dr. Johney Maine, surgery for resection.  Scheduled for colonoscopy this week.  Significant Hospital Events   10/2-admit  Consults:  GI, PCCM  Procedures:  10/2 ex lap with hemicolectomy and diverting colostomy  Significant Diagnostic Tests:  CTA 04/11/2019- no PE, right lower lobe consolidation.  1.4 cm left upper lobe nodule.  Emphysema, aortic, coronary atherosclerosis. CT head neck 04/27/2019-chronic ischemic changes.  No acute abnormalities. I have reviewed the images personally.  Micro Data:  10/1 blood >> COVID neg  Antimicrobials:  Vanc 10/1 >> Cefepime 10/1>> Flagyl 10/1>>  Interim history/subjective:  Overnight uncontrolled pain, which was treated with Versed.  Fentanyl not increased.  This morning not responding, but was given Versed about an hour prior. Objective   Blood pressure (!) 99/49, pulse 78, temperature 97.7 F (36.5 C), temperature source Oral, resp. rate (!) 22, height 6\' 2"  (1.88 m), weight 67.1 kg, SpO2 100 %.    Vent Mode: PRVC FiO2 (%):  [25 %-40 %] 30 % Set Rate:  [22 bmp] 22 bmp Vt Set:  [490 mL] 490 mL PEEP:  [5 cmH20] 5 cmH20 Pressure Support:  [8 cmH20-15 cmH20] 15 cmH20  Plateau Pressure:  [13 cmH20-20 cmH20] 20 cmH20   Intake/Output Summary (Last 24 hours) at 03/31/2019 0944 Last data filed at 03/31/2019 0931 Gross per 24 hour  Intake 5182.21 ml  Output 2560 ml  Net 2622.21 ml   Filed Weights   04/24/2019 1229 03/30/19 0436 03/31/19 0335  Weight: 42.1 kg 66 kg 67.1 kg    Examination:. Gen:   Frail and cachectic elderly man lying in bed intubated, sedated  HEENT: Normocephalic, atraumatic, eyes anicteric.  Temporal wasting Neck:     Supple Lungs:    Clear to auscultation bilaterally.  Less dark brown secretions from ET tube CV:        Regular rate and rhythm, no murmurs Abd:   Soft, nontender.  Ostomy pink.  Brown stool from ostomy.  Wound VAC in place.  Lighter green green NG output. Ext:    No peripheral edema or wounds Skin:      Warm and dry, no rash Neuro: Eyes open, but not tracking or responding to verbal stimulation.  Breathing above the ventilator, positive cough.  Pupils reactive bilaterally. Resolved Hospital Problem list     Assessment & Plan:  84 year old with likely colon cancer admitted with GI bleed, shock, lactic acidosis  Acute hypoxic respiratory failure requiring mechanical ventilation.  Likely had aspiration of gastric contents from small bowel obstruction prior to admission.  Wife reports he had been coughing up thick brown sputum prior to admission. -Tinea low tidal volume ventilation, 8 cc/kg ideal body weight.  Goal plateau less than 30 and driving pressure less than 15. -Titrate down FiO2 as able to 19 SPO2  greater than 90% -Daily SAT and SBT; limited today by overnight sedation.  We will continue to monitor -We will continue to monitor secretions from ET tube.  If they increase again we will consider bronchoscopy, which I discussed with his wife yesterday.  Acute GI bleed-controlled.  Received multiple units of packed red cells perioperatively. -Continue to monitor CBC -Continue high-dose PPI -Appreciate GI and surgery's  input  Abdominal mass, likely colon cancer.  Complicated by acute small bowel obstruction.  - Pathology pending -Appreciate surgery's input  Shock, lactic acidosis- resolved  -Continue broad-spectrum antibiotics -Continue vasopressors as required to maintain SBP greater than 90 given wide pulse pressure.  No change in urine output overnight when blood pressure goals changed. -Would prefer to avoid vasopressin to preserve perfusion of his his anastomosis.  Hypokalemia, resolved -Continue to monitor  Non-anion gap metabolic acidosis -Continue to monitor  Aspiration pneumonia,  Incidental finding of lung nodule.  Possible mets from the colon -Continue antibiotics -We will monitor, but may require washout bronchoscopy  AKI -improving -Continue to monitor -Measure I/O, maintain euvolemia -Avoid nephrotoxic meds   Goals of care Previously goals of care been discussed with his wife.  She is quite overwhelmed and is coming into the hospital to discuss further.  He remains full code for now.  Best practice:  Diet: NPO Pain/Anxiety/Delirium protocol (if indicated): NA VAP protocol (if indicated): NA DVT prophylaxis: SCDs GI prophylaxis: PPI Glucose control: [place] Mobility: Bed Code Status: Full Family Communication: Wife-updated at bedside 10/3 Disposition: ICU  Labs   CBC: Recent Labs  Lab 04/26/2019 1542  04/02/2019 0749  04/06/2019 2347 03/30/19 0236 03/30/19 0308 03/30/19 1439 03/31/19 0148  WBC 12.4*  --  12.8*  --  4.0 4.6  --  11.1* 17.8*  NEUTROABS 9.0*  --  11.0*  --   --   --   --   --   --   HGB 8.6*   < > 9.2*   < > 12.8* 11.2* 10.5* 10.8* 10.7*  HCT 30.8*   < > 28.2*   < > 36.1* 32.8* 31.0* 29.5* 31.1*  MCV 88.3  --  77.0*  --  80.6 81.2  --  80.2 82.1  PLT 485*  --  390  --  220 206  --  210 205   < > = values in this interval not displayed.    Basic Metabolic Panel: Recent Labs  Lab 04/01/2019 1542  04/18/2019 2001 04/11/2019 0439 04/27/2019 0730 04/21/2019  1512 04/04/2019 1750 03/30/19 0236 03/30/19 0308 03/31/19 0148  NA 134*   < > 134*  134* 135 135 137 139 140 140 137  K 3.9   < > 3.6  3.7 4.1 4.0 3.9 4.0 3.6 3.4* 4.1  CL 97*   < > 99 101 104  --   --  110  --  110  CO2 10*  --   --  19* 19*  --   --  21*  --  19*  GLUCOSE 301*   < > 165* 124* 110*  --   --  59*  --  84  BUN 26*   < > 25* 34* 38*  --   --  36*  --  23  CREATININE 1.64*   < > 1.10 1.43* 1.52*  --   --  1.58*  --  1.23  CALCIUM 9.6  --   --  8.5* 8.4*  --   --  7.4*  --  7.7*  MG  --   --   --   --   --   --   --  1.6*  --  1.7  PHOS  --   --   --   --   --   --   --  2.2*  --   --    < > = values in this interval not displayed.   GFR: Estimated Creatinine Clearance: 43.9 mL/min (by C-G formula based on SCr of 1.23 mg/dL). Recent Labs  Lab 04/02/2019 1543 04/10/2019 1950  04/16/2019 1240 04/25/2019 2347 03/30/19 0236 03/30/19 0332 03/30/19 1439 03/31/19 0148  WBC  --   --    < >  --  4.0 4.6  --  11.1* 17.8*  LATICACIDVEN >11.0* 7.8*  --  2.2*  --   --  1.9  --   --    < > = values in this interval not displayed.    Liver Function Tests: Recent Labs  Lab 04/21/2019 1542 04/04/2019 0730  AST 38 43*  ALT <5 21  ALKPHOS 77 61  BILITOT 0.4 0.5  PROT 7.0 6.1*  ALBUMIN 2.2* 1.9*   No results for input(s): LIPASE, AMYLASE in the last 168 hours. Recent Labs  Lab 03/30/2019 1543  AMMONIA 244*    ABG    Component Value Date/Time   PHART 7.463 (H) 03/30/2019 0308   PCO2ART 28.2 (L) 03/30/2019 0308   PO2ART 80.0 (L) 03/30/2019 0308   HCO3 20.1 03/30/2019 0308   TCO2 21 (L) 03/30/2019 0308   ACIDBASEDEF 3.0 (H) 03/30/2019 0308   O2SAT 96.0 03/30/2019 0308     Coagulation Profile: Recent Labs  Lab 04/06/2019 1542 04/20/2019 1240  INR 1.5* 1.4*    Cardiac Enzymes: No results for input(s): CKTOTAL, CKMB, CKMBINDEX, TROPONINI in the last 168 hours.  HbA1C: Hgb A1c MFr Bld  Date/Time Value Ref Range Status  04/02/2019 07:49 AM 5.6 4.8 - 5.6 % Final     Comment:    (NOTE) Pre diabetes:          5.7%-6.4% Diabetes:              >6.4% Glycemic control for   <7.0% adults with diabetes     CBG: Recent Labs  Lab 03/30/19 1928 03/30/19 1931 03/30/19 2321 03/31/19 0328 03/31/19 0721  GLUCAP 68* 78 76 85 87    This patient is critically ill with multiple organ system failure which requires frequent high complexity decision making, assessment, support, evaluation, and titration of therapies. This was completed through the application of advanced monitoring technologies and extensive interpretation of multiple databases. During this encounter critical care time was devoted to patient care services described in this note for 40 minutes.  Julian Hy, DO 03/31/19 9:59 AM [place] [facility]

## 2019-03-31 NOTE — Progress Notes (Signed)
Pt converted to afib with HR in the low 100s around 1115 range.  Informed Dr. Carlis Abbott.  No new orders.  Will continue to monitor closely.  Irven [place], RN

## 2019-04-01 ENCOUNTER — Encounter (HOSPITAL_COMMUNITY): Payer: Self-pay | Admitting: Surgery

## 2019-04-01 DIAGNOSIS — J439 Emphysema, unspecified: Secondary | ICD-10-CM

## 2019-04-01 DIAGNOSIS — K55011 Focal (segmental) acute (reversible) ischemia of small intestine: Secondary | ICD-10-CM

## 2019-04-01 HISTORY — DX: Emphysema, unspecified: J43.9

## 2019-04-01 LAB — POCT I-STAT 7, (LYTES, BLD GAS, ICA,H+H)
Acid-base deficit: 8 mmol/L — ABNORMAL HIGH (ref 0.0–2.0)
Bicarbonate: 18.9 mmol/L — ABNORMAL LOW (ref 20.0–28.0)
Calcium, Ion: 1.14 mmol/L — ABNORMAL LOW (ref 1.15–1.40)
HCT: 27 % — ABNORMAL LOW (ref 39.0–52.0)
Hemoglobin: 9.2 g/dL — ABNORMAL LOW (ref 13.0–17.0)
O2 Saturation: 92 %
Potassium: 4.3 mmol/L (ref 3.5–5.1)
Sodium: 138 mmol/L (ref 135–145)
TCO2: 20 mmol/L — ABNORMAL LOW (ref 22–32)
pCO2 arterial: 44.7 mmHg (ref 32.0–48.0)
pH, Arterial: 7.235 — ABNORMAL LOW (ref 7.350–7.450)
pO2, Arterial: 74 mmHg — ABNORMAL LOW (ref 83.0–108.0)

## 2019-04-01 LAB — COMPREHENSIVE METABOLIC PANEL
ALT: 33 U/L (ref 0–44)
AST: 37 U/L (ref 15–41)
Albumin: 1.5 g/dL — ABNORMAL LOW (ref 3.5–5.0)
Alkaline Phosphatase: 62 U/L (ref 38–126)
Anion gap: 6 (ref 5–15)
BUN: 14 mg/dL (ref 8–23)
CO2: 22 mmol/L (ref 22–32)
Calcium: 8.6 mg/dL — ABNORMAL LOW (ref 8.9–10.3)
Chloride: 120 mmol/L — ABNORMAL HIGH (ref 98–111)
Creatinine, Ser: 1.1 mg/dL (ref 0.61–1.24)
GFR calc Af Amer: >60 mL/min (ref >60)
GFR calc non Af Amer: >60 mL/min (ref >60)
Glucose, Bld: 116 mg/dL — ABNORMAL HIGH (ref 70–99)
Potassium: 3.3 mmol/L — ABNORMAL LOW (ref 3.5–5.1)
Sodium: 148 mmol/L — ABNORMAL HIGH (ref 135–145)
Total Bilirubin: 2 mg/dL — ABNORMAL HIGH (ref 0.3–1.2)
Total Protein: 4.6 g/dL — ABNORMAL LOW (ref 6.5–8.1)

## 2019-04-01 LAB — AMMONIA: Ammonia: 22 umol/L (ref 9–35)

## 2019-04-01 LAB — CBC
HCT: 29.5 % — ABNORMAL LOW (ref 39.0–52.0)
Hemoglobin: 10.2 g/dL — ABNORMAL LOW (ref 13.0–17.0)
MCH: 28.6 pg (ref 26.0–34.0)
MCHC: 34.6 g/dL (ref 30.0–36.0)
MCV: 82.6 fL (ref 80.0–100.0)
Platelets: 201 10*3/uL (ref 150–400)
RBC: 3.57 MIL/uL — ABNORMAL LOW (ref 4.22–5.81)
RDW: 18.9 % — ABNORMAL HIGH (ref 11.5–15.5)
WBC: 23.2 10*3/uL — ABNORMAL HIGH (ref 4.0–10.5)
nRBC: 0 % (ref 0.0–0.2)

## 2019-04-01 LAB — PROTIME-INR
INR: 2 — ABNORMAL HIGH (ref 0.8–1.2)
Prothrombin Time: 22.2 seconds — ABNORMAL HIGH (ref 11.4–15.2)

## 2019-04-01 LAB — TRIGLYCERIDES: Triglycerides: 50 mg/dL (ref <150)

## 2019-04-01 LAB — GLUCOSE, CAPILLARY
Glucose-Capillary: 103 mg/dL — ABNORMAL HIGH (ref 70–99)
Glucose-Capillary: 96 mg/dL (ref 70–99)
Glucose-Capillary: 96 mg/dL (ref 70–99)
Glucose-Capillary: 99 mg/dL (ref 70–99)
Glucose-Capillary: 119 mg/dL — ABNORMAL HIGH (ref 70–99)

## 2019-04-01 LAB — MAGNESIUM: Magnesium: 1.9 mg/dL (ref 1.7–2.4)

## 2019-04-01 LAB — CORTISOL: Cortisol, Plasma: 35.1 ug/dL

## 2019-04-01 LAB — PHOSPHORUS: Phosphorus: 1.7 mg/dL — ABNORMAL LOW (ref 2.5–4.6)

## 2019-04-01 MED ORDER — POTASSIUM CHLORIDE 10 MEQ/50ML IV SOLN
10.0000 meq | INTRAVENOUS | Status: AC
  Administered 2019-04-01 (×2): 10 meq via INTRAVENOUS
  Filled 2019-04-01 (×3): qty 50

## 2019-04-01 MED ORDER — PRO-STAT SUGAR FREE PO LIQD
30.0000 mL | Freq: Two times a day (BID) | ORAL | Status: DC
  Administered 2019-04-01 – 2019-04-03 (×5): 30 mL
  Filled 2019-04-01 (×5): qty 30

## 2019-04-01 MED ORDER — PANTOPRAZOLE SODIUM 40 MG PO TBEC: 40.0000 mg | DELAYED_RELEASE_TABLET | Freq: Two times a day (BID) | ORAL | Status: DC

## 2019-04-01 MED ORDER — PANTOPRAZOLE SODIUM 40 MG IV SOLR
40.0000 mg | Freq: Two times a day (BID) | INTRAVENOUS | Status: DC
  Administered 2019-04-01 – 2019-04-02 (×4): 40 mg via INTRAVENOUS
  Filled 2019-04-01 (×4): qty 40

## 2019-04-01 MED ORDER — SODIUM CHLORIDE 0.9 % IV BOLUS
1000.0000 mL | Freq: Once | INTRAVENOUS | Status: AC
  Administered 2019-04-01: 1000 mL via INTRAVENOUS

## 2019-04-01 MED ORDER — VITAL HIGH PROTEIN PO LIQD: 1000.0000 mL | ORAL | Status: DC

## 2019-04-01 MED ORDER — VITAL AF 1.2 CAL PO LIQD
1000.0000 mL | ORAL | Status: DC
  Administered 2019-04-01 – 2019-04-02 (×2): 1000 mL
  Filled 2019-04-01: qty 1000

## 2019-04-01 NOTE — Consult Note (Signed)
[place] Nurse wound follow up Wound type: surgical   Measurement:15cm x 1cm x 0.4cm   Wound bed:100% clean, superficial  Drainage (amount, consistency, odor) none Periwound: skin is irritated from black sponge on periwound Dressing procedure/placement/frequency: Removed old NPWT dressing Skin protected over the umbilicus to protect intact skin  Filled wound with  _1__ piece of black foam, Sealed NPWT dressing at 152mm HG Patient received IV pain medication per bedside nurse prior to dressing change Patient tolerated procedure well, patient intubated  [place] nurse will continue to provide NPWT dressing changed due to the complexity of the dressing change.      [place] Nurse ostomy follow up Stoma type/location: RLQ; ileostomy  Stomal assessment/size: 1 1/2" round budded, os at 6 o'clock  Peristomal assessment: intact  Treatment options for stomal/peristomal skin: 2" barrier ring  Output liquid green black Ostomy pouching: 2pc. 2 3/4"  Education provided: none, patient intubated Enrolled patient in [employer] Discharge program: No  WOC Nurse will follow along with you for continued support with wound and ostomy teaching and care [place] MSN, [place], [place], [place], [place]

## 2019-04-01 NOTE — Progress Notes (Signed)
[facility] ADULT ICU REPLACEMENT PROTOCOL FOR AM LAB REPLACEMENT ONLY  The patient does apply for the [facility] Adult ICU Electrolyte Replacment Protocol based on the criteria listed below:   1. Is GFR >/= 40 ml/min? Yes.    Patient's GFR today is >60 2. Is urine output >/= 0.5 ml/kg/hr for the last 6 hours? Yes.   Patient's UOP is 1.4 ml/kg/hr 3. Is BUN < 60 mg/dL? Yes.    Patient's BUN today is 14 4. Abnormal electrolyte(s): K 3.3 5. Ordered repletion with: protocol 6. If a panic level lab has been reported, has the CCM MD in charge been notified? No..   Physician:    Ronda Fairly A 04/01/2019 6:03 AM

## 2019-04-01 NOTE — Progress Notes (Signed)
 Nutrition Follow-up  DOCUMENTATION CODES:   Severe malnutrition in context of chronic illness  INTERVENTION:   Tube Feeding:  Change to Vital AF 1.2 at 20 ml/hr Goal Rate: Vital AF 1.2 at 60 ml/hr Pro-Stat 30 mL daily Provides 1828 kcals, 123 g of protein and 1166 mL of free water Meets 100% of estimated calorie and    NUTRITION DIAGNOSIS:   Severe Malnutrition related to cancer and cancer related treatments, chronic illness(colon cancer, SBO) as evidenced by severe fat depletion, severe muscle depletion, energy intake < 75% for > or equal to 1 month.  Being addressed via nutrition support  GOAL:   Patient will meet greater than or equal to 90% of their needs  Progressing  MONITOR:   Diet advancement, Labs, Weight trends, Skin  REASON FOR ASSESSMENT:   Other (Comment)    ASSESSMENT:   84 yo male admitted with shock, aspiration pneumonia with incidental finding of lung nodule on CT, suspected colon cancer with possible GI bleed, AKI. PMH includes HTN, peripheral neuropathy   10/02 Intubated, Ex Lap, Right Colectomy with End Ileostomy, Jejunal SB resection with anastomosis, wedge biopsy of liver mass, application of wound VAC over closed fascia  Patient is currently intubated on ventilator support, fentanyl drip, levophed MV: 11.4 L/min Temp (24hrs), Avg:98.4 F (36.9 C), Min:97.3 F (36.3 C), Max:100.1 F (37.8 C)  Abdominal incision with wound vac in place, no output  NG tube in place with tip projecting into the stomach  Current weight 69.6 kg; weight of 66 kg on 10/3 post surgery. Net +15 L since admission  Labs: sodium 148 (H), Creatinine wdl Meds: D5-LR at 100 ml/hr   Diet Order:   Diet Order            Diet NPO time specified  Diet effective now              EDUCATION NEEDS:   Not appropriate for education at this time  Skin:  Skin Assessment: Reviewed RN Assessment  Last BM:  10/05 Ileosotmy  Height:   Ht Readings from Last 1  Encounters:  03/28/2019 6\' 2"  (1.88 m)    Weight:   Wt Readings from Last 1 Encounters:  04/01/19 69.6 kg    Ideal Body Weight:  86.4 kg  BMI:  Body mass index is 19.7 kg/m.  Estimated Nutritional Needs:   Kcal:  1833 kcals  Protein:  100-120 g  Fluid:  >/= 1.5 L     Everleigh Colclasure MS, RDN, LDN, CNSC [phone] Pager  [phone] Weekend/On-Call Pager

## 2019-04-01 NOTE — Progress Notes (Signed)
Updated pt's daughter Lannette Donath on pt's condition. She is very appreciative of the care [facility] is providing. Will continue to monitor pt.

## 2019-04-01 NOTE — Progress Notes (Signed)
Darryl Johns [id number] 07/03/1934  CARE TEAM:  PCP: Kristie Cowman, MD  Outpatient Care Team: Patient Care Team: Kristie Cowman, MD as PCP - General (Family Medicine) Debara Pickett Nadean Corwin, MD as Consulting Physician (Cardiology) Michael Boston, MD as Consulting Physician (Colon and Rectal Surgery) Coral Spikes, MD as Consulting Physician (Gastroenterology) Kathrynn Ducking, MD as Consulting Physician (Neurology)  Inpatient Treatment Team: Treatment Team: Attending Provider: Chesley Mires, MD; Rounding Team: (Rounding), Imts - Vanita Ingles, MD; Rounding Team: Pccm, Md, MD; Registered Nurse: Tenna Child, RN; Consulting Physician: Edison Pace, Md, MD; Consulting Physician: Michael Boston, MD; Registered Nurse: Romona Curls, RN; Registered Nurse: Lance Sell, RN; Registered Nurse: Antonietta Breach, RN   Problem List:   Principal Problem:   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  Suspicious for possible microperforation but no feculent contamination.  Moderate  ascites.  Proximal jejunum just distal ligament of Treitz x2 feet twisted around to the colon tumor and trapped by omentum causing a volvulus and closed-loop type obstruction.  Distal jejunum with edema.  Ileum without inflammation. Patient has a side-to-side staple anastomosis of the very proximal jejunum at the ligament of Treitz to mid jejunum isoperistaltic.  Proximal colon including tumor excised en bloc with proximal jejunum.  End ileostomy in right supraumbilical paramedian region.  Prolene sutures on the proximal Hartmann staple line at the distal transverse colon near the splenic flexure.  Hard fibrotic 5 mm nodule on the dome of the right liver.  Wedge biopsy done.  No other evidence of any intraperitoneal carcinomatosis.  Assessment  Guarded but stabilizing  [place] Stay = 4 days)  Plan:  Low output from the nasogastric tube with some bilious effluent in the ileostomy bag.  Start low rate trickle tube feeds.  ICU protocol.  I would not advance until the patient is off all pressors.  Otherwise it will overload him.  Minimal vent settings.  Weaning parameters for critical care.  Excellent urine output and normal creatinine argues against renal failure.  Persistent soft blood pressure with recent atrial fibrillation most likely reflection of the stress and third spacing from his hemorrhagic and possibly early septic shock.  Reconsider echocardiogram to rule out cardiogenic shock since he seems to be appropriately tanked up.  Check INR given the fact he seemed coagulopathic intraoperatively even though his hemoglobin is stable.  There was no evidence of any gastritis or ulcer.  I will stop the Protonix drip and switch to just IV twice daily.  Most likely just to once a day given his  age once he is off pressors.  Ileostomy care.  Wound VAC is on the subcu.  Change Monday Wednesday Friday, starting today.  Follow-up on pathology.  Highly suspicious of colon cancer invading  into proximal jejunum.  Possible small liver metastasis as well.  Lung mass highly suspicious for a separate or metastatic malignancy.  Defer work-up until later.  No need to continue IV antibiotics from a surgical standpoint.  He did not have frank severe peritonitis and is been 72 hours since surgery.  Defer to critical care/medicine if they feel he needs it for pulmonary or other concerns.    45 minutes spent in review, evaluation, examination, counseling, and coordination of care.  More than 50% of that time was spent in counseling.   04/01/2019    Subjective: (Chief complaint)  Intubated.  Minimally interactive.  Remains on pressors.  Nasogastric tube and ileostomy with bilious output  Objective:  Vital signs:  Vitals:   04/01/19 0615 04/01/19 0630 04/01/19 0645 04/01/19 0700  BP: (!) 97/55 (!) 104/49 98/62 (!) 86/68  Pulse: 97 97 98 91  Resp: (!) 22 (!) 22 (!) 23 (!) 27  Temp:      TempSrc:      SpO2: 100% 100% 100% 100%  Weight:      Height:        Last BM Date: 03/31/19  Intake/Output   Yesterday:  10/04 0701 - 10/05 0700 In: 6741.5 [I.V.:4388.3; IV Piggyback:2353.2] Out: 4000 [id number]; Stool:80] This shift:  No intake/output data recorded.  Bowel function:  Flatus: No  BM:  YES  Drain: (No drain)   Physical Exam:  General: Pt awake/alert/oriented x4 in moderate acute distress Eyes: PERRL, normal EOM.  Sclera clear.  No icterus Neuro: CN II-XII intact w/o focal sensory/motor deficits. Lymph: No head/neck/groin lymphadenopathy Psych:  No delerium/psychosis/paranoia HENT: Normocephalic, Mucus membranes moist.  No thrush NG tube in place.  ET tube in place Neck: Supple, No tracheal deviation Chest: No chest wall pain w good excursion CV:  Pulses intact.  Regular rhythm MS: Normal AROM mjr joints.  No obvious deformity  Abdomen: Soft.  Nondistended.  Mildly tender at incisions only.  Ileostomy right upper quadrant pink and edematous.  No  definite gas but some thin brown bilious effluent in bag wound VAC in midline incision clean.  No evidence of peritonitis.  No incarcerated hernias.  Ext:  1+ edeman.  No deformity.  No mjr edema.  No cyanosis Skin: No petechiae / purpura  Results:   Cultures: Recent Results (from the past 720 hour(s))  SARS Coronavirus 2 [facility] order, Performed in [facility] hospital lab) Nasopharyngeal Nasopharyngeal Swab     Status: None   Collection Time: 04/21/2019  4:03 PM   Specimen: Nasopharyngeal Swab  Result Value Ref Range Status   SARS Coronavirus 2 NEGATIVE NEGATIVE Final    Comment: (NOTE) If result is NEGATIVE SARS-CoV-2 target nucleic acids are NOT DETECTED. The SARS-CoV-2 RNA is generally detectable in upper and lower  respiratory specimens during the acute phase of infection. The lowest  concentration of SARS-CoV-2 viral copies this assay can detect is 250  copies / mL. A negative result does not preclude SARS-CoV-2 infection  and should not be used as the sole basis for treatment or other  patient management decisions.  A negative result may occur with  improper specimen collection / handling, submission of specimen other  than nasopharyngeal swab, presence of viral mutation(s) within the  areas targeted by this assay, and  inadequate number of viral copies  (<250 copies / mL). A negative result must be combined with clinical  observations, patient history, and epidemiological information. If result is POSITIVE SARS-CoV-2 target nucleic acids are DETECTED. The SARS-CoV-2 RNA is generally detectable in upper and lower  respiratory specimens dur ing the acute phase of infection.  Positive  results are indicative of active infection with SARS-CoV-2.  Clinical  correlation with patient history and other diagnostic information is  necessary to determine patient infection status.  Positive results do  not rule out bacterial infection or co-infection with other viruses. If result is  PRESUMPTIVE POSTIVE SARS-CoV-2 nucleic acids MAY BE PRESENT.   A presumptive positive result was obtained on the submitted specimen  and confirmed on repeat testing.  While 2019 novel coronavirus  (SARS-CoV-2) nucleic acids may be present in the submitted sample  additional confirmatory testing may be necessary for epidemiological  and / or clinical management purposes  to differentiate between  SARS-CoV-2 and other Sarbecovirus currently known to infect humans.  If clinically indicated additional testing with an alternate test  methodology [id number]) is advised. The SARS-CoV-2 RNA is generally  detectable in upper and lower respiratory sp ecimens during the acute  phase of infection. The expected result is Negative. Fact Sheet for Patients:  StrictlyIdeas.no Fact Sheet for Healthcare Providers: BankingDealers.co.za This test is not yet approved or cleared by the [place] FDA and has been authorized for detection and/or diagnosis of SARS-CoV-2 by FDA under an Emergency Use Authorization (EUA).  This EUA will remain in effect (meaning this test can be used) for the duration of the COVID-19 declaration under Section 564(b)(1) of the Act, 21 U.S.C. section 360bbb-3(b)(1), unless the authorization is terminated or revoked sooner. Performed at [facility], [place] [address], [place], [place] [address]   Blood culture (routine x 2)     Status: None (Preliminary result)   Collection Time: 04/27/2019  7:55 PM   Specimen: BLOOD LEFT HAND  Result Value Ref Range Status   Specimen Description BLOOD LEFT HAND  Final   Special Requests   Final    BOTTLES DRAWN AEROBIC AND ANAEROBIC Blood Culture adequate volume   Culture   Final    NO GROWTH 3 DAYS Performed at [facility], [place] [address] [place], [place] [address]    Report Status PENDING  Incomplete  Blood culture (routine x 2)     Status: None (Preliminary result)   Collection  Time: 04/01/2019  8:04 PM   Specimen: BLOOD RIGHT HAND  Result Value Ref Range Status   Specimen Description BLOOD RIGHT HAND  Final   Special Requests   Final    BOTTLES DRAWN AEROBIC ONLY Blood Culture results may not be optimal due to an inadequate volume of blood received in culture bottles   Culture   Final    NO GROWTH 3 DAYS Performed at [facility], [place] [address] [place], [place] [address]    Report Status PENDING  Incomplete  Urine culture     Status: None   Collection Time: 04/04/2019 12:35 AM   Specimen: Urine, Catheterized  Result Value Ref Range Status   Specimen Description URINE, CATHETERIZED  Final   Special Requests NONE  Final   Culture   Final    NO GROWTH Performed at [facility], [place] [address] [place], [place] [address]    Report Status 04/13/2019 FINAL  Final  MRSA PCR Screening  Status: None   Collection Time: 04/21/2019 12:39 PM   Specimen: Nasal Mucosa; Nasopharyngeal  Result Value Ref Range Status   MRSA by PCR NEGATIVE NEGATIVE Final    Comment:        The GeneXpert MRSA Assay (FDA approved for NASAL specimens only), is one component of a comprehensive MRSA colonization surveillance program. It is not intended to diagnose MRSA infection nor to guide or monitor treatment for MRSA infections. Performed at [facility], [place] [address], [place], [place] [address]   Culture, respiratory (non-expectorated)     Status: None (Preliminary result)   Collection Time: 03/31/19 11:20 AM   Specimen: Tracheal Aspirate; Respiratory  Result Value Ref Range Status   Specimen Description TRACHEAL ASPIRATE  Final   Special Requests NONE  Final   Gram Stain   Final    RARE WBC PRESENT, PREDOMINANTLY PMN RARE YEAST Performed at [facility], [place] [address], [place], [place] [address]    Culture PENDING  Incomplete   Report Status PENDING  Incomplete    Labs: Results for orders placed or performed during the hospital encounter of  04/11/2019 (from the past 48 hour(s))  Glucose, capillary     Status: None   Collection Time: 03/30/19  7:35 AM  Result Value Ref Range   Glucose-Capillary 80 70 - 99 mg/dL  Glucose, capillary     Status: Abnormal   Collection Time: 03/30/19 11:03 AM  Result Value Ref Range   Glucose-Capillary 68 (L) 70 - 99 mg/dL  Glucose, capillary     Status: None   Collection Time: 03/30/19 11:12 AM  Result Value Ref Range   Glucose-Capillary 91 70 - 99 mg/dL  CBC     Status: Abnormal   Collection Time: 03/30/19  2:39 PM  Result Value Ref Range   WBC 11.1 (H) 4.0 - 10.5 K/uL   RBC 3.68 (L) 4.22 - 5.81 MIL/uL   Hemoglobin 10.8 (L) 13.0 - 17.0 g/dL   HCT 29.5 (L) 39.0 - 52.0 %   MCV 80.2 80.0 - 100.0 fL   MCH 29.3 26.0 - 34.0 pg   MCHC 36.6 (H) 30.0 - 36.0 g/dL   RDW 17.4 (H) 11.5 - 15.5 %   Platelets 210 150 - 400 K/uL   nRBC 0.0 0.0 - 0.2 %    Comment: Performed at [facility], [address] [place], [place] [address]  Glucose, capillary     Status: Abnormal   Collection Time: 03/30/19  3:24 PM  Result Value Ref Range   Glucose-Capillary 48 (L) 70 - 99 mg/dL  Glucose, capillary     Status: None   Collection Time: 03/30/19  3:41 PM  Result Value Ref Range   Glucose-Capillary 81 70 - 99 mg/dL  Glucose, capillary     Status: Abnormal   Collection Time: 03/30/19  7:28 PM  Result Value Ref Range   Glucose-Capillary 68 (L) 70 - 99 mg/dL  Glucose, capillary     Status: None   Collection Time: 03/30/19  7:31 PM  Result Value Ref Range   Glucose-Capillary 78 70 - 99 mg/dL  Glucose, capillary     Status: None   Collection Time: 03/30/19 11:21 PM  Result Value Ref Range   Glucose-Capillary 76 70 - 99 mg/dL   Comment 1 Notify RN   CBC     Status: Abnormal   Collection Time: 03/31/19  1:48 AM  Result Value Ref Range   WBC 17.8 (H)  4.0 - 10.5 K/uL   RBC 3.79 (L) 4.22 - 5.81 MIL/uL   Hemoglobin 10.7 (L) 13.0 - 17.0 g/dL   HCT 31.1 (L) 39.0 - 52.0 %   MCV 82.1 80.0 - 100.0 fL   MCH  28.2 26.0 - 34.0 pg   MCHC 34.4 30.0 - 36.0 g/dL   RDW 18.1 (H) 11.5 - 15.5 %   Platelets 205 150 - 400 K/uL   nRBC 0.0 0.0 - 0.2 %    Comment: Performed at [facility] [address], [place], [place] [id number]  Basic metabolic panel     Status: Abnormal   Collection Time: 03/31/19  1:48 AM  Result Value Ref Range   Sodium 137 135 - 145 mmol/L   Potassium 4.1 3.5 - 5.1 mmol/L    Comment: DELTA CHECK NOTED   Chloride 110 98 - 111 mmol/L   CO2 19 (L) 22 - 32 mmol/L   Glucose, Bld 84 70 - 99 mg/dL   BUN 23 8 - 23 mg/dL   Creatinine, Ser 1.23 0.61 - 1.24 mg/dL   Calcium 7.7 (L) 8.9 - 10.3 mg/dL   GFR calc non Af Amer 54 (L) >60 mL/min   GFR calc Af Amer >60 >60 mL/min   Anion gap 8 5 - 15    Comment: Performed at [facility], [place] [address], [place], [place] [address]  Magnesium     Status: None   Collection Time: 03/31/19  1:48 AM  Result Value Ref Range   Magnesium 1.7 1.7 - 2.4 mg/dL    Comment: Performed at [facility] [address], [place], [place] [address]  Glucose, capillary     Status: None   Collection Time: 03/31/19  3:28 AM  Result Value Ref Range   Glucose-Capillary 85 70 - 99 mg/dL   Comment 1 Notify RN   Glucose, capillary     Status: None   Collection Time: 03/31/19  7:21 AM  Result Value Ref Range   Glucose-Capillary 87 70 - 99 mg/dL  Glucose, capillary     Status: None   Collection Time: 03/31/19 11:19 AM  Result Value Ref Range   Glucose-Capillary 91 70 - 99 mg/dL  Culture, respiratory (non-expectorated)     Status: None (Preliminary result)   Collection Time: 03/31/19 11:20 AM   Specimen: Tracheal Aspirate; Respiratory  Result Value Ref Range   Specimen Description TRACHEAL ASPIRATE    Special Requests NONE    Gram Stain      RARE WBC PRESENT, PREDOMINANTLY PMN RARE YEAST Performed at [facility], [place] [address], [place], [place] [address]    Culture PENDING    Report Status PENDING   Glucose, capillary     Status:  None   Collection Time: 03/31/19  4:09 PM  Result Value Ref Range   Glucose-Capillary 93 70 - 99 mg/dL  Glucose, capillary     Status: None   Collection Time: 03/31/19  7:30 PM  Result Value Ref Range   Glucose-Capillary 91 70 - 99 mg/dL  Basic metabolic panel     Status: Abnormal   Collection Time: 03/31/19  9:13 PM  Result Value Ref Range   Sodium 141 135 - 145 mmol/L   Potassium 3.0 (L) 3.5 - 5.1 mmol/L    Comment: DELTA CHECK NOTED   Chloride 114 (H) 98 - 111 mmol/L   CO2 22 22 - 32 mmol/L   Glucose, Bld 100 (H) 70 -  99 mg/dL   BUN 17 8 - 23 mg/dL   Creatinine, Ser 1.06 0.61 - 1.24 mg/dL   Calcium 8.0 (L) 8.9 - 10.3 mg/dL   GFR calc non Af Amer >60 >60 mL/min   GFR calc Af Amer >60 >60 mL/min   Anion gap 5 5 - 15    Comment: Performed at [facility] [address], [place], [place] [address]  Magnesium     Status: None   Collection Time: 03/31/19  9:13 PM  Result Value Ref Range   Magnesium 2.2 1.7 - 2.4 mg/dL    Comment: Performed at [facility] [address], [place], [place] [address]  Hemoglobin and hematocrit, blood     Status: Abnormal   Collection Time: 03/31/19 11:06 PM  Result Value Ref Range   Hemoglobin 10.1 (L) 13.0 - 17.0 g/dL   HCT 28.5 (L) 39.0 - 52.0 %    Comment: Performed at [facility] [address], [place], [place] [address]  Glucose, capillary     Status: Abnormal   Collection Time: 03/31/19 11:33 PM  Result Value Ref Range   Glucose-Capillary 100 (H) 70 - 99 mg/dL  Glucose, capillary     Status: Abnormal   Collection Time: 04/01/19  3:42 AM  Result Value Ref Range   Glucose-Capillary 103 (H) 70 - 99 mg/dL  CBC     Status: Abnormal   Collection Time: 04/01/19  4:54 AM  Result Value Ref Range   WBC 23.2 (H) 4.0 - 10.5 K/uL   RBC 3.57 (L) 4.22 - 5.81 MIL/uL   Hemoglobin 10.2 (L) 13.0 - 17.0 g/dL   HCT 29.5 (L) 39.0 - 52.0 %   MCV 82.6 80.0 - 100.0 fL   MCH 28.6 26.0 - 34.0 pg   MCHC 34.6 30.0 - 36.0 g/dL   RDW 18.9  (H) 11.5 - 15.5 %   Platelets 201 150 - 400 K/uL   nRBC 0.0 0.0 - 0.2 %    Comment: Performed at [facility], [place] [address], [place], [place] [address]  Comprehensive metabolic panel     Status: Abnormal   Collection Time: 04/01/19  4:54 AM  Result Value Ref Range   Sodium 148 (H) 135 - 145 mmol/L   Potassium 3.3 (L) 3.5 - 5.1 mmol/L   Chloride 120 (H) 98 - 111 mmol/L   CO2 22 22 - 32 mmol/L   Glucose, Bld 116 (H) 70 - 99 mg/dL   BUN 14 8 - 23 mg/dL   Creatinine, Ser 1.10 0.61 - 1.24 mg/dL   Calcium 8.6 (L) 8.9 - 10.3 mg/dL   Total Protein 4.6 (L) 6.5 - 8.1 g/dL   Albumin 1.5 (L) 3.5 - 5.0 g/dL   AST 37 15 - 41 U/L   ALT 33 0 - 44 U/L   Alkaline Phosphatase 62 38 - 126 U/L   Total Bilirubin 2.0 (H) 0.3 - 1.2 mg/dL   GFR calc non Af Amer >60 >60 mL/min   GFR calc Af Amer >60 >60 mL/min   Anion gap 6 5 - 15    Comment: Performed at [facility], [place] [address], [place], [place] [address]    Imaging / Studies: No results found.  Medications / Allergies: per chart  Antibiotics: Anti-infectives (From admission, onward)   Start     Dose/Rate Route Frequency Ordered Stop   03/30/19 0600  cefoTEtan (CEFOTAN) 2 g in sodium chloride 0.9 %  100 mL IVPB    Note to Pharmacy: Pharmacy may adjust dose strength for optimal dosing.   Send with patient on call to the OR.  Anesthesia to complete antibiotic administration <28min prior to incision per [facility].   2 g 200 mL/hr over 30 Minutes Intravenous To Short Stay 04/27/2019 1335 04/23/2019 1559   04/09/2019 2200  cefoTEtan (CEFOTAN) 2 g in sodium chloride 0.9 % 100 mL IVPB     2 g 200 mL/hr over 30 Minutes Intravenous Every 12 hours 03/31/2019 1902 04/11/2019 2214   04/22/2019 1730  vancomycin (VANCOCIN) 1,250 mg in sodium chloride 0.9 % 250 mL IVPB  Status:  Discontinued     1,250 mg 166.7 mL/hr over 90 Minutes Intravenous Every 24 hours 04/01/2019 1720 04/17/2019 0928   04/04/2019 1400  neomycin (MYCIFRADIN) tablet 1,000 mg  Status:   Discontinued     1,000 mg Oral 3 times per day 04/25/2019 0620 04/01/2019 0839   04/19/2019 1400  metroNIDAZOLE (FLAGYL) tablet 1,000 mg  Status:  Discontinued     1,000 mg Oral 3 times per day 04/07/2019 0620 04/10/2019 0839   03/30/2019 1013  vancomycin variable dose per unstable renal function (pharmacist dosing)  Status:  Discontinued      Does not apply See admin instructions 04/08/2019 1014 04/23/2019 1908   04/22/2019 0930  metroNIDAZOLE (FLAGYL) IVPB 500 mg     500 mg 100 mL/hr over 60 Minutes Intravenous Every 8 hours 04/01/2019 0920     04/06/2019 0630  cefoTEtan (CEFOTAN) 2 g in sodium chloride 0.9 % 100 mL IVPB  Status:  Discontinued     2 g 200 mL/hr over 30 Minutes Intravenous On call to O.R. 04/27/2019 [id number] 04/17/2019 0839   04/03/2019 0630  clindamycin (CLEOCIN) 900 mg, gentamicin (GARAMYCIN) 240 mg in sodium chloride 0.9 % 1,000 mL for intraperitoneal lavage  Status:  Discontinued      Irrigation To Surgery 04/14/2019 0620 04/02/2019 0840   04/19/2019 0500  ceFEPIme (MAXIPIME) 2 g in sodium chloride 0.9 % 100 mL IVPB     2 g 200 mL/hr over 30 Minutes Intravenous Every 12 hours 04/13/2019 1720     04/22/2019 1630  ceFEPIme (MAXIPIME) 2 g in sodium chloride 0.9 % 100 mL IVPB     2 g 200 mL/hr over 30 Minutes Intravenous  Once 04/13/2019 1617 04/07/2019 1804   04/24/2019 1630  metroNIDAZOLE (FLAGYL) IVPB 500 mg     500 mg 100 mL/hr over 60 Minutes Intravenous  Once 03/30/2019 1617 04/10/2019 1928   04/07/2019 1630  vancomycin (VANCOCIN) IVPB 1000 mg/200 mL premix  Status:  Discontinued     1,000 mg 200 mL/hr over 60 Minutes Intravenous  Once 04/09/2019 1617 04/05/2019 1626   04/11/2019 1630  vancomycin (VANCOCIN) 1,500 mg in sodium chloride 0.9 % 500 mL IVPB     1,500 mg 250 mL/hr over 120 Minutes Intravenous  Once 04/22/2019 1626 04/04/2019 2157        Note: Portions of this report may have been transcribed using voice recognition software. Every effort was made to ensure accuracy; however, inadvertent computerized  transcription errors may be present.   Any transcriptional errors that result from this process are unintentional.     Adin Hector, MD, FACS, MASCRS Gastrointestinal and Minimally Invasive Surgery    [address] [place], [place] [address] [phone] Main / Paging [phone] Fax

## 2019-04-01 NOTE — Progress Notes (Addendum)
NAME:  Darryl Johns, MRN:  [id number], DOB:  August 12, 1934, LOS: 4 ADMISSION DATE:  04/07/2019, CONSULTATION DATE:   REFERRING MD: Velna Ochs MD, CHIEF COMPLAINT:  GI bleed  Brief History   84 year old M admitted 10/1 with fall, syncope, hematemesis, melena, shock. Concern for GI bleed Resuscitated with 3 L IV fluids in the ED with persistent hypotension.  Elevated lactic acid noted and labs.  Work up revealed an abdominal transverse colon mass with high suspicion for GI malignancy.  He has been evaluated by Dr. Johney Maine, surgery for resection. Underwent hemicolectomy and diverting colostomy on 10/2 per Dr. Johney Maine.   Past Medical History  RBBB, peripheral neuropathy, HTN, allergies, colon mass  Significant Hospital Events   10/2 Admit 10/04 Overnight uncontrolled pain, which was treated with Versed.  Fentanyl not increased.   10/05 PSV weaning 10/5, levo at 41mcg  Consults:  GI, PCCM  Procedures:  10/02 Ex lap with hemicolectomy and diverting colostomy  Significant Diagnostic Tests:  CTA 10/1 >> no PE, right lower lobe consolidation.  1.4 cm left upper lobe nodule.  Emphysema, aortic, coronary atherosclerosis. CT head / neck 10/1 >> chronic ischemic changes.  No acute abnormalities.  Micro Data:  COVID 10/1 >> negative  UC 10/2 >> negative  Tracheal aspirate 10/4 >>  BCx2 10/1 >>   Antimicrobials:  Vanc 10/1 >> 10/2 Cefepime 10/1 >> Flagyl 10/1 >>  Interim history/subjective:  Tmax 100.1.  I/O- 3.9L UOP in last 24 hours / +2.8L in 24/h.  Failed first wean attempt > fentanyl gtt turned off & pt able to wean.  However, indicates he is hurting.  Objective   Blood pressure (!) 107/58, pulse (!) 103, temperature 100.1 F (37.8 C), temperature source Oral, resp. rate (!) 22, height 6\' 2"  (1.88 m), weight 69.6 kg, SpO2 100 %. CVP:  [0 mmHg-5 mmHg] 1 mmHg  Vent Mode: PRVC FiO2 (%):  [30 %] 30 % Set Rate:  [22 bmp] 22 bmp Vt Set:  [490 mL] 490 mL PEEP:  [5 cmH20] 5 cmH20  Plateau Pressure:  [18 cmH20-20 cmH20] 18 cmH20   Intake/Output Summary (Last 24 hours) at 04/01/2019 0932 Last data filed at 04/01/2019 0900 Gross per 24 hour  Intake 6928.61 ml  Output 3590 ml  Net 3338.61 ml   Filed Weights   03/30/19 0436 03/31/19 0335 04/01/19 0234  Weight: 66 kg 67.1 kg 69.6 kg    Examination:. General: frail elderly male lying in bed in NAD on vent, appears uncomfortable  HEENT: MM pink/moist, temporal wasting Neuro: eyes open, looks at provider, nods but not able to hold arms up against gravity / global weakness  CV: s1s2 rrr, tachy, no m/r/g PULM:  Even/non-labored, lungs bilaterally clear  GI: soft, midline VAC in place, colostomy RLQ with pink stoma, burgundy secretions in bag  Extremities: warm/dry, no edema  Skin: no rashes or lesions  Resolved Hospital Problem list   Lactic Acidosis  Hypokalemia  NGMA  Assessment & Plan:    Acute hypoxic respiratory failure requiring mechanical ventilation.   -Likely had aspiration of gastric contents from small bowel obstruction prior to admission.  Wife reported he had been coughing up thick brown sputum prior to admission. P: PRVC 8cc/kg  Adjust baseline rate to 16 Wean PEEP / FiO2 for sats >90% Daily SBT / WUA  Follow intermittent CXR  Adjust RASS Goal to 0 to -1   Acute GI Bleed -received multiple units of packed red cells perioperatively P: Trend CBC  High dose  PPI  Appreciate GI, CCS input   Abdominal Mass s/p Hemicolectomy & Diverting Colostomy -suspected colon cancer, path pending.   Acute Small Bowel Obstruction P: Follow pathology  Appreciate CCS  Colostomy care per protocol  Shock  P: Continue levophed for MAP > 65  Avoid vasopressin if able to preserve perfusion to anastomosis  Broad spectrum antibiotics as above   Aspiration pneumonia Incidental finding of lung nodule -possible mets from the colon P: ABX as above  Will need outpatient follow up for nodule   AKI  Hypernatremia  Hypokalemia P: Trend BMP / urinary output Replace electrolytes as indicated, KCL 10/5 Avoid nephrotoxic agents, ensure adequate renal perfusion D5LR at 173ml/hr Consider free water if tolerates TF 10/5  Hyperammoniemia  Elevated INR / Coagulopathy P: Follow up ammonia  Trend INR  Note CT Abd 10/2 without mention of liver findings  Severe Protein Calorie Malnutriton P: TF initiated per CCS 10/5 Prostat supplementation   Best practice:  Diet: NPO Pain/Anxiety/Delirium protocol (if indicated): Fentanyl gtt VAP protocol (if indicated): in place DVT prophylaxis: SCDs GI prophylaxis: PPI Glucose control: follow glucose on BMP Mobility: Bed Code Status: Full Code  Family Communication: Wife updated via phone 10/5 on plan of care.   Disposition: ICU  Labs   CBC: Recent Labs  Lab 04/24/2019 1542  04/17/2019 0749  04/08/2019 2347 03/30/19 0236 03/30/19 0308 03/30/19 1439 03/31/19 0148 03/31/19 2306 04/01/19 0454  WBC 12.4*  --  12.8*  --  4.0 4.6  --  11.1* 17.8*  --  23.2*  NEUTROABS 9.0*  --  11.0*  --   --   --   --   --   --   --   --   HGB 8.6*   < > 9.2*   < > 12.8* 11.2* 10.5* 10.8* 10.7* 10.1* 10.2*  HCT 30.8*   < > 28.2*   < > 36.1* 32.8* 31.0* 29.5* 31.1* 28.5* 29.5*  MCV 88.3  --  77.0*  --  80.6 81.2  --  80.2 82.1  --  82.6  PLT 485*  --  390  --  220 206  --  210 205  --  201   < > = values in this interval not displayed.    Basic Metabolic Panel: Recent Labs  Lab 04/13/2019 0730  03/30/19 0236 03/30/19 0308 03/31/19 0148 03/31/19 2113 04/01/19 0454  NA 135   < > 140 140 137 141 148*  K 4.0   < > 3.6 3.4* 4.1 3.0* 3.3*  CL 104  --  110  --  110 114* 120*  CO2 19*  --  21*  --  19* 22 22  GLUCOSE 110*  --  59*  --  84 100* 116*  BUN 38*  --  36*  --  23 17 14   CREATININE 1.52*  --  1.58*  --  1.23 1.06 1.10  CALCIUM 8.4*  --  7.4*  --  7.7* 8.0* 8.6*  MG  --   --  1.6*  --  1.7 2.2  --   PHOS  --   --  2.2*  --   --   --   --    < >  = values in this interval not displayed.   GFR: Estimated Creatinine Clearance: 51 mL/min (by C-G formula based on SCr of 1.1 mg/dL). Recent Labs  Lab 04/05/2019 1543 04/27/2019 1950  04/11/2019 1240  03/30/19 0236 03/30/19 0332 03/30/19 1439 03/31/19 0148 04/01/19  0454  WBC  --   --    < >  --    < > 4.6  --  11.1* 17.8* 23.2*  LATICACIDVEN >11.0* 7.8*  --  2.2*  --   --  1.9  --   --   --    < > = values in this interval not displayed.    Liver Function Tests: Recent Labs  Lab 04/20/2019 1542 03/30/2019 0730 04/01/19 0454  AST 38 43* 37  ALT <5 21 33  ALKPHOS 77 61 62  BILITOT 0.4 0.5 2.0*  PROT 7.0 6.1* 4.6*  ALBUMIN 2.2* 1.9* 1.5*   No results for input(s): LIPASE, AMYLASE in the last 168 hours. Recent Labs  Lab 04/25/2019 1543  AMMONIA 244*    ABG    Component Value Date/Time   PHART 7.463 (H) 03/30/2019 0308   PCO2ART 28.2 (L) 03/30/2019 0308   PO2ART 80.0 (L) 03/30/2019 0308   HCO3 20.1 03/30/2019 0308   TCO2 21 (L) 03/30/2019 0308   ACIDBASEDEF 3.0 (H) 03/30/2019 0308   O2SAT 96.0 03/30/2019 0308     Coagulation Profile: Recent Labs  Lab 03/31/2019 1542 04/25/2019 1240 04/01/19 0750  INR 1.5* 1.4* 2.0*    Cardiac Enzymes: No results for input(s): CKTOTAL, CKMB, CKMBINDEX, TROPONINI in the last 168 hours.  HbA1C: Hgb A1c MFr Bld  Date/Time Value Ref Range Status  04/24/2019 07:49 AM 5.6 4.8 - 5.6 % Final    Comment:    (NOTE) Pre diabetes:          5.7%-6.4% Diabetes:              >6.4% Glycemic control for   <7.0% adults with diabetes     CBG: Recent Labs  Lab 03/31/19 1609 03/31/19 1930 03/31/19 2333 04/01/19 0342 04/01/19 0752  GLUCAP 93 91 100* 103* 96    CRITICAL CARE Performed by: Noe Gens   Total critical care time: 35 minutes  Critical care time was exclusive of separately billable procedures and treating other patients.  Critical care was necessary to treat or prevent imminent or life-threatening deterioration.   Critical care was time spent personally by me on the following activities: development of treatment plan with patient and/or surrogate as well as nursing, discussions with consultants, evaluation of patient's response to treatment, examination of patient, obtaining history from patient or surrogate, ordering and performing treatments and interventions, ordering and review of laboratory studies, ordering and review of radiographic studies, pulse oximetry and re-evaluation of patient's condition.  Noe Gens, NP-C  Pulmonary & Critical Care Pgr: [phone] or if no answer [phone] 04/01/2019, 9:33 AM

## 2019-04-02 ENCOUNTER — Inpatient Hospital Stay (HOSPITAL_COMMUNITY): Payer: Medicare HMO

## 2019-04-02 DIAGNOSIS — D689 Coagulation defect, unspecified: Secondary | ICD-10-CM

## 2019-04-02 LAB — BPAM FFP
Blood Product Expiration Date: 202010072359
Blood Product Expiration Date: 202010072359
ISSUE DATE / TIME: 202010021635
ISSUE DATE / TIME: 202010021635
Unit Type and Rh: 7300
Unit Type and Rh: 7300

## 2019-04-02 LAB — CULTURE, BLOOD (ROUTINE X 2)
Culture: NO GROWTH
Culture: NO GROWTH
Special Requests: ADEQUATE

## 2019-04-02 LAB — GLUCOSE, CAPILLARY
Glucose-Capillary: 137 mg/dL — ABNORMAL HIGH (ref 70–99)
Glucose-Capillary: 141 mg/dL — ABNORMAL HIGH (ref 70–99)
Glucose-Capillary: 147 mg/dL — ABNORMAL HIGH (ref 70–99)
Glucose-Capillary: 159 mg/dL — ABNORMAL HIGH (ref 70–99)
Glucose-Capillary: 163 mg/dL — ABNORMAL HIGH (ref 70–99)
Glucose-Capillary: 171 mg/dL — ABNORMAL HIGH (ref 70–99)
Glucose-Capillary: 184 mg/dL — ABNORMAL HIGH (ref 70–99)
Glucose-Capillary: 45 mg/dL — ABNORMAL LOW (ref 70–99)

## 2019-04-02 LAB — BASIC METABOLIC PANEL
Anion gap: 7 (ref 5–15)
Anion gap: 10 (ref 5–15)
BUN: 15 mg/dL (ref 8–23)
BUN: 18 mg/dL (ref 8–23)
CO2: 17 mmol/L — ABNORMAL LOW (ref 22–32)
CO2: 20 mmol/L — ABNORMAL LOW (ref 22–32)
Calcium: 7.1 mg/dL — ABNORMAL LOW (ref 8.9–10.3)
Calcium: 8.4 mg/dL — ABNORMAL LOW (ref 8.9–10.3)
Chloride: 124 mmol/L — ABNORMAL HIGH (ref 98–111)
Chloride: 118 mmol/L — ABNORMAL HIGH (ref 98–111)
Creatinine, Ser: 0.98 mg/dL (ref 0.61–1.24)
Creatinine, Ser: 1.2 mg/dL (ref 0.61–1.24)
GFR calc Af Amer: >60 mL/min (ref >60)
GFR calc Af Amer: >60 mL/min (ref >60)
GFR calc non Af Amer: >60 mL/min (ref >60)
GFR calc non Af Amer: 56 mL/min — ABNORMAL LOW (ref >60)
Glucose, Bld: 142 mg/dL — ABNORMAL HIGH (ref 70–99)
Glucose, Bld: 193 mg/dL — ABNORMAL HIGH (ref 70–99)
Potassium: 2.7 mmol/L — CL (ref 3.5–5.1)
Potassium: 3.1 mmol/L — ABNORMAL LOW (ref 3.5–5.1)
Sodium: 148 mmol/L — ABNORMAL HIGH (ref 135–145)
Sodium: 148 mmol/L — ABNORMAL HIGH (ref 135–145)

## 2019-04-02 LAB — CBC
HCT: 31.8 % — ABNORMAL LOW (ref 39.0–52.0)
Hemoglobin: 10.7 g/dL — ABNORMAL LOW (ref 13.0–17.0)
MCH: 27.9 pg (ref 26.0–34.0)
MCHC: 33.6 g/dL (ref 30.0–36.0)
MCV: 83 fL (ref 80.0–100.0)
Platelets: 213 10*3/uL (ref 150–400)
RBC: 3.83 MIL/uL — ABNORMAL LOW (ref 4.22–5.81)
RDW: 20.1 % — ABNORMAL HIGH (ref 11.5–15.5)
WBC: 27.1 10*3/uL — ABNORMAL HIGH (ref 4.0–10.5)
nRBC: 0 % (ref 0.0–0.2)

## 2019-04-02 LAB — CULTURE, RESPIRATORY W GRAM STAIN

## 2019-04-02 LAB — PREPARE FRESH FROZEN PLASMA
Unit division: 0
Unit division: 0

## 2019-04-02 LAB — MAGNESIUM
Magnesium: 1.6 mg/dL — ABNORMAL LOW (ref 1.7–2.4)
Magnesium: 2 mg/dL (ref 1.7–2.4)

## 2019-04-02 LAB — PHOSPHORUS
Phosphorus: 1.2 mg/dL — ABNORMAL LOW (ref 2.5–4.6)
Phosphorus: 2.8 mg/dL (ref 2.5–4.6)
Phosphorus: 3.3 mg/dL (ref 2.5–4.6)

## 2019-04-02 MED ORDER — POTASSIUM PHOSPHATES 15 MMOLE/5ML IV SOLN
30.0000 mmol | Freq: Once | INTRAVENOUS | Status: AC
  Administered 2019-04-02: 30 mmol via INTRAVENOUS
  Filled 2019-04-02: qty 10

## 2019-04-02 MED ORDER — MAGNESIUM SULFATE 2 GM/50ML IV SOLN
2.0000 g | Freq: Once | INTRAVENOUS | Status: AC
  Administered 2019-04-02: 10:00:00 2 g via INTRAVENOUS
  Filled 2019-04-02: qty 50

## 2019-04-02 MED ORDER — MIDAZOLAM HCL 2 MG/2ML IJ SOLN
1.0000 mg | INTRAMUSCULAR | Status: DC | PRN
  Administered 2019-04-02 – 2019-04-03 (×6): 1 mg via INTRAVENOUS
  Filled 2019-04-02 (×7): qty 2

## 2019-04-02 MED ORDER — METOPROLOL TARTRATE 5 MG/5ML IV SOLN
2.5000 mg | INTRAVENOUS | Status: DC | PRN
  Administered 2019-04-02 – 2019-04-04 (×4): 2.5 mg via INTRAVENOUS
  Filled 2019-04-02 (×4): qty 5

## 2019-04-02 MED ORDER — FUROSEMIDE 10 MG/ML IJ SOLN
40.0000 mg | Freq: Once | INTRAMUSCULAR | Status: AC
  Administered 2019-04-02: 40 mg via INTRAVENOUS
  Filled 2019-04-02: qty 4

## 2019-04-02 NOTE — Progress Notes (Signed)
CRITICAL VALUE ALERT  Critical Value:  K+ 2.7  Date & Time Notied:  04/02/19  0515  Provider Notified: Margaree Mackintosh MD via Elta Guadeloupe, RN  Orders Received/Actions taken: Awaiting orders

## 2019-04-02 NOTE — Progress Notes (Signed)
[facility] Progress Note Patient Name: Darryl Johns DOB: [birth year] MRN: [id number]   Date of Service  04/02/2019  HPI/Events of Note  K+ = 2.7, PO4--- = 1.2 and Creatinine = 0.98.   eICU Interventions  Will order: 1. Replace K+ and PO4---. 2. Repeat BMP and phosphorus level at 1 PM.     Intervention Category Major Interventions: Electrolyte abnormality - evaluation and management  [name],[name] 04/02/2019, 5:32 AM

## 2019-04-02 NOTE — Progress Notes (Signed)
Darryl Johns [id number] [birth year]  CARE TEAM:  PCP: Kristie Cowman, MD  Outpatient Care Team: Patient Care Team: Kristie Cowman, MD as PCP - General (Family Medicine) Debara Pickett Nadean Corwin, MD as Consulting Physician (Cardiology) Michael Boston, MD as Consulting Physician (Colon and Rectal Surgery) Coral Spikes, MD as Consulting Physician (Gastroenterology) Kathrynn Ducking, MD as Consulting Physician (Neurology)  Inpatient Treatment Team: Treatment Team: Attending Provider: Chesley Mires, MD; Rounding Team: Pccm, Md, MD; Registered Nurse: Tenna Child, RN; Consulting Physician: Edison Pace, Md, MD; Consulting Physician: Michael Boston, MD; Registered Nurse: Romona Curls, RN; Registered Nurse: Lance Sell, RN; Registered Nurse: Antonietta Breach, RN; Registered Nurse: Tarry Kos, RN   Problem List:   Principal Problem:   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[facility]) Active Problems:   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  Probable cancer  Jejunal ischemia due to adhesions to transverse colon cancer and omentum causing closed-loop volvulus   PROCEDURE:  EXPLORATORY LAPAROTOMY EXTENDED RIGHT COLECTOMY WITH END ILEOSTOMY / HARTMANN JEJUNAL SMALL BOWEL RESECTION WITH ANASTOMOSIS WEDGE BIOPSY OF LIVER MASS APPLICATION OF WOUND VAC OVER CLOSED FASCIA  SURGEON:  Adin Hector, MD  OR FINDINGS:  Bulky mid transverse colonic mass very  inflamed.  Suspicious for possible microperforation but no feculent contamination.  Moderate ascites.  Proximal jejunum just distal ligament of Treitz x2 feet twisted around to the colon tumor and trapped by omentum causing a volvulus and closed-loop type obstruction.  Distal jejunum with edema.  Ileum without inflammation. Patient has a side-to-side staple anastomosis of the very proximal jejunum at the ligament of Treitz to mid jejunum isoperistaltic.  Proximal colon including tumor excised en bloc with proximal jejunum.  End ileostomy in right supraumbilical paramedian region.  Prolene sutures on the proximal Hartmann staple line at the distal transverse colon near the splenic flexure.  Hard fibrotic 5 mm nodule on the dome of the right liver.  Wedge biopsy done.  No other evidence of any intraperitoneal carcinomatosis.  Assessment  Guarded but stabilizing  [facility] Stay = 5 days)  Plan:  Continue low rate trickle tube feeds.  ICU protocol.  I would not advance until the patient is not in shock.  Otherwise it will overload him.  Minimal vent settings.  Weaning parameters for critical care.  Suspect will be 1-2 more days to stabilize & diurese  Good urine output and normal creatinine argues against renal failure.  Elevated INR - check Hgb & follow  There was no evidence of any gastritis or ulcer.  I will stop the Protonix drip and switch to just IV twice daily.  Most likely just to once a day given his age once he is off pressors.  Low K & Mg - replacing  Ileostomy care.  Wound VAC is on the SQ.  Change Monday Wednesday Friday, starting today.  Follow-up on pathology.  Highly suspicious of colon  cancer invading into proximal jejunum.  Possible small liver metastasis as well.  Lung mass highly suspicious for a separate or metastatic malignancy.  Defer work-up until later.  No need to continue IV antibiotics from a surgical standpoint.  He did not have frank severe  peritonitis and is been 72 hours since surgery.  Defer to critical care/medicine if they feel he needs it for pulmonary or other concerns.    45 minutes spent in review, evaluation, examination, counseling, and coordination of care.  More than 50% of that time was spent in counseling.  I updated the patient's status to the patient's nurse.  Recommendations were made.  Questions were answered.  She expressed understanding & appreciation.    04/02/2019    Subjective: (Chief complaint)  Weaned off pressors.  Remains on ventilator.  Low rate tube feeds running.  Very low residuals.  Some pulmonary secretions with mild tan tinge  Agitated and confused off sedation.  Not consistently following commands.  Wound VAC change done with good granulation tissue  Objective:  Vital signs:  Vitals:   04/02/19 0400 04/02/19 0417 04/02/19 0500 04/02/19 0600  BP: 117/60  (!) 112/56 115/61  Pulse: 93  (!) 109 (!) 111  Resp: 18  15 15   Temp:      TempSrc:      SpO2: 100% 100% 100% 99%  Weight:      Height:        Last BM Date: 04/01/19  Intake/Output   Yesterday:  10/05 0701 - 10/06 0700 In: 4385 [I.V.:2716.5; NG/GT:373; IV Piggyback:1295.5] Out: [id number]; Emesis/NG output:150] This shift:  No intake/output data recorded.  Bowel function:  Flatus: No  BM:  No  Drain: (No drain)   Physical Exam:  General: Pt awake/alert/oriented x4 in mild acute distress Eyes: PERRL, normal EOM.  Sclera clear.  No icterus Neuro: CN II-XII intact w/o focal sensory/motor deficits. Lymph: No head/neck/groin lymphadenopathy Psych:  No delerium/psychosis/paranoia HENT: Normocephalic, Mucus membranes moist.  No thrush NG tube in place.  ET tube in place Neck: Supple, No tracheal deviation Chest: No chest wall pain w good excursion CV:  Pulses intact.  Regular rhythm MS: Normal AROM mjr joints.  No obvious deformity  Abdomen: Soft.  Mildy distended.  Mildly tender at incisions only.   Ileostomy right upper quadrant pink and edematous.  Wound VAC in midline incision clean.  No evidence of peritonitis.  No incarcerated hernias.  Ext:  2+ edema.  No deformity.  No mjr edema.  No cyanosis Skin: No petechiae / purpura  Results:   Cultures: Recent Results (from the past 720 hour(s))  SARS Coronavirus 2 [facility] order, Performed in [facility] hospital lab) Nasopharyngeal Nasopharyngeal Swab     Status: None   Collection Time: 04/07/2019  4:03 PM   Specimen: Nasopharyngeal Swab  Result Value Ref Range Status   SARS Coronavirus 2 NEGATIVE NEGATIVE Final    Comment: (NOTE) If result is NEGATIVE SARS-CoV-2 target nucleic acids are NOT DETECTED. The SARS-CoV-2 RNA is generally detectable in upper and lower  respiratory specimens during the acute phase of infection. The lowest  concentration of SARS-CoV-2 viral copies this assay can detect is 250  copies / mL. A negative result does not preclude SARS-CoV-2 infection  and should not be used as the sole basis for treatment or other  patient management decisions.  A negative result may occur with  improper specimen collection / handling, submission of specimen other  than nasopharyngeal swab, presence of viral  mutation(s) within the  areas targeted by this assay, and inadequate number of viral copies  (<250 copies / mL). A negative result must be combined with clinical  observations, patient history, and epidemiological information. If result is POSITIVE SARS-CoV-2 target nucleic acids are DETECTED. The SARS-CoV-2 RNA is generally detectable in upper and lower  respiratory specimens dur ing the acute phase of infection.  Positive  results are indicative of active infection with SARS-CoV-2.  Clinical  correlation with patient history and other diagnostic information is  necessary to determine patient infection status.  Positive results do  not rule out bacterial infection or co-infection with other viruses. If result is  PRESUMPTIVE POSTIVE SARS-CoV-2 nucleic acids MAY BE PRESENT.   A presumptive positive result was obtained on the submitted specimen  and confirmed on repeat testing.  While 2019 novel coronavirus  (SARS-CoV-2) nucleic acids may be present in the submitted sample  additional confirmatory testing may be necessary for epidemiological  and / or clinical management purposes  to differentiate between  SARS-CoV-2 and other Sarbecovirus currently known to infect humans.  If clinically indicated additional testing with an alternate test  methodology [phone]) is advised. The SARS-CoV-2 RNA is generally  detectable in upper and lower respiratory sp ecimens during the acute  phase of infection. The expected result is Negative. Fact Sheet for Patients:  StrictlyIdeas.no Fact Sheet for Healthcare Providers: BankingDealers.co.za This test is not yet approved or cleared by the [place] FDA and has been authorized for detection and/or diagnosis of SARS-CoV-2 by FDA under an Emergency Use Authorization (EUA).  This EUA will remain in effect (meaning this test can be used) for the duration of the COVID-19 declaration under Section 564(b)(1) of the Act, 21 U.S.C. section 360bbb-3(b)(1), unless the authorization is terminated or revoked sooner. Performed at [facility], [place] [address] [place], [place] [address]   Blood culture (routine x 2)     Status: None (Preliminary result)   Collection Time: 04/24/2019  7:55 PM   Specimen: BLOOD LEFT HAND  Result Value Ref Range Status   Specimen Description BLOOD LEFT HAND  Final   Special Requests   Final    BOTTLES DRAWN AEROBIC AND ANAEROBIC Blood Culture adequate volume   Culture   Final    NO GROWTH 4 DAYS Performed at [facility], [place] [address], [place], [place] [address]    Report Status PENDING  Incomplete  Blood culture (routine x 2)     Status: None (Preliminary result)   Collection  Time: 04/19/2019  8:04 PM   Specimen: BLOOD RIGHT HAND  Result Value Ref Range Status   Specimen Description BLOOD RIGHT HAND  Final   Special Requests   Final    BOTTLES DRAWN AEROBIC ONLY Blood Culture results may not be optimal due to an inadequate volume of blood received in culture bottles   Culture   Final    NO GROWTH 4 DAYS Performed at [facility], [place] [address] [place], [place] [address]    Report Status PENDING  Incomplete  Urine culture     Status: None   Collection Time: 03/31/2019 12:35 AM   Specimen: Urine, Catheterized  Result Value Ref Range Status   Specimen Description URINE, CATHETERIZED  Final   Special Requests NONE  Final   Culture   Final    NO GROWTH Performed at [facility], [place] [address] [place], [place] [address]    Report Status 04/05/2019  FINAL  Final  MRSA PCR Screening     Status: None   Collection Time: 03/31/2019 12:39 PM   Specimen: Nasal Mucosa; Nasopharyngeal  Result Value Ref Range Status   MRSA by PCR NEGATIVE NEGATIVE Final    Comment:        The GeneXpert MRSA Assay (FDA approved for NASAL specimens only), is one component of a comprehensive MRSA colonization surveillance program. It is not intended to diagnose MRSA infection nor to guide or monitor treatment for MRSA infections. Performed at [facility], [place] [address], [place], [place] [address]   Culture, respiratory (non-expectorated)     Status: None (Preliminary result)   Collection Time: 03/31/19 11:20 AM   Specimen: Tracheal Aspirate; Respiratory  Result Value Ref Range Status   Specimen Description TRACHEAL ASPIRATE  Final   Special Requests NONE  Final   Gram Stain RARE WBC PRESENT, PREDOMINANTLY PMN RARE YEAST   Final   Culture   Final    FEW YEAST IDENTIFICATION TO FOLLOW Performed at [facility], [place] [address], [place], [place] [address]    Report Status PENDING  Incomplete    Labs: Results for orders placed or performed during  the hospital encounter of 04/22/2019 (from the past 48 hour(s))  Glucose, capillary     Status: None   Collection Time: 03/31/19  7:21 AM  Result Value Ref Range   Glucose-Capillary 87 70 - 99 mg/dL  Glucose, capillary     Status: None   Collection Time: 03/31/19 11:19 AM  Result Value Ref Range   Glucose-Capillary 91 70 - 99 mg/dL  Culture, respiratory (non-expectorated)     Status: None (Preliminary result)   Collection Time: 03/31/19 11:20 AM   Specimen: Tracheal Aspirate; Respiratory  Result Value Ref Range   Specimen Description TRACHEAL ASPIRATE    Special Requests NONE    Gram Stain RARE WBC PRESENT, PREDOMINANTLY PMN RARE YEAST     Culture      FEW YEAST IDENTIFICATION TO FOLLOW Performed at [facility], [place] [address], [place], [place] [address]    Report Status PENDING   Glucose, capillary     Status: None   Collection Time: 03/31/19  4:09 PM  Result Value Ref Range   Glucose-Capillary 93 70 - 99 mg/dL  Glucose, capillary     Status: None   Collection Time: 03/31/19  7:30 PM  Result Value Ref Range   Glucose-Capillary 91 70 - 99 mg/dL  Basic metabolic panel     Status: Abnormal   Collection Time: 03/31/19  9:13 PM  Result Value Ref Range   Sodium 141 135 - 145 mmol/L   Potassium 3.0 (L) 3.5 - 5.1 mmol/L    Comment: DELTA CHECK NOTED   Chloride 114 (H) 98 - 111 mmol/L   CO2 22 22 - 32 mmol/L   Glucose, Bld 100 (H) 70 - 99 mg/dL   BUN 17 8 - 23 mg/dL   Creatinine, Ser 1.06 0.61 - 1.24 mg/dL   Calcium 8.0 (L) 8.9 - 10.3 mg/dL   GFR calc non Af Amer >60 >60 mL/min   GFR calc Af Amer >60 >60 mL/min   Anion gap 5 5 - 15    Comment: Performed at [facility], [place] [address] [place], [place] [address]  Magnesium     Status: None   Collection Time: 03/31/19  9:13 PM  Result Value Ref Range   Magnesium 2.2 1.7 - 2.4 mg/dL  Comment: Performed at [facility], [place] [address], [place], [place] [address]  Hemoglobin and hematocrit, blood      Status: Abnormal   Collection Time: 03/31/19 11:06 PM  Result Value Ref Range   Hemoglobin 10.1 (L) 13.0 - 17.0 g/dL   HCT 28.5 (L) 39.0 - 52.0 %    Comment: Performed at [place] [address], [place], [place] [address]  Glucose, capillary     Status: Abnormal   Collection Time: 03/31/19 11:33 PM  Result Value Ref Range   Glucose-Capillary 100 (H) 70 - 99 mg/dL  Glucose, capillary     Status: Abnormal   Collection Time: 04/01/19  3:42 AM  Result Value Ref Range   Glucose-Capillary 103 (H) 70 - 99 mg/dL  CBC     Status: Abnormal   Collection Time: 04/01/19  4:54 AM  Result Value Ref Range   WBC 23.2 (H) 4.0 - 10.5 K/uL   RBC 3.57 (L) 4.22 - 5.81 MIL/uL   Hemoglobin 10.2 (L) 13.0 - 17.0 g/dL   HCT 29.5 (L) 39.0 - 52.0 %   MCV 82.6 80.0 - 100.0 fL   MCH 28.6 26.0 - 34.0 pg   MCHC 34.6 30.0 - 36.0 g/dL   RDW 18.9 (H) 11.5 - 15.5 %   Platelets 201 150 - 400 K/uL   nRBC 0.0 0.0 - 0.2 %    Comment: Performed at [facility], [place] [address], [place], [place] [address]  Comprehensive metabolic panel     Status: Abnormal   Collection Time: 04/01/19  4:54 AM  Result Value Ref Range   Sodium 148 (H) 135 - 145 mmol/L   Potassium 3.3 (L) 3.5 - 5.1 mmol/L   Chloride 120 (H) 98 - 111 mmol/L   CO2 22 22 - 32 mmol/L   Glucose, Bld 116 (H) 70 - 99 mg/dL   BUN 14 8 - 23 mg/dL   Creatinine, Ser 1.10 0.61 - 1.24 mg/dL   Calcium 8.6 (L) 8.9 - 10.3 mg/dL   Total Protein 4.6 (L) 6.5 - 8.1 g/dL   Albumin 1.5 (L) 3.5 - 5.0 g/dL   AST 37 15 - 41 U/L   ALT 33 0 - 44 U/L   Alkaline Phosphatase 62 38 - 126 U/L   Total Bilirubin 2.0 (H) 0.3 - 1.2 mg/dL   GFR calc non Af Amer >60 >60 mL/min   GFR calc Af Amer >60 >60 mL/min   Anion gap 6 5 - 15    Comment: Performed at [facility], [place] [address], [place], [place] [address]  Protime-INR     Status: Abnormal   Collection Time: 04/01/19  7:50 AM  Result Value Ref Range   Prothrombin Time 22.2 (H) 11.4 - 15.2 seconds   INR  2.0 (H) 0.8 - 1.2    Comment: (NOTE) INR goal varies based on device and disease states. Performed at [facility], [place] [address], [place], [place] [address]   Glucose, capillary     Status: None   Collection Time: 04/01/19  7:52 AM  Result Value Ref Range   Glucose-Capillary 96 70 - 99 mg/dL  Ammonia     Status: None   Collection Time: 04/01/19 10:32 AM  Result Value Ref Range   Ammonia 22 9 - 35 umol/L    Comment: Performed at [facility], [place] [address], [place], [place] [address]  Cortisol     Status: None  Collection Time: 04/01/19 11:36 AM  Result Value Ref Range   Cortisol, Plasma 35.1 ug/dL    Comment: (NOTE) AM    6.7 - 22.6 ug/dL PM   <10.0       ug/dL Performed at [facility] [address], [place], [place] [address]   Glucose, capillary     Status: None   Collection Time: 04/01/19 12:28 PM  Result Value Ref Range   Glucose-Capillary 96 70 - 99 mg/dL  Glucose, capillary     Status: None   Collection Time: 04/01/19  3:29 PM  Result Value Ref Range   Glucose-Capillary 99 70 - 99 mg/dL  Magnesium     Status: None   Collection Time: 04/01/19  4:22 PM  Result Value Ref Range   Magnesium 1.9 1.7 - 2.4 mg/dL    Comment: Performed at [facility], [place] [address], [place], [place] [address]  Phosphorus     Status: Abnormal   Collection Time: 04/01/19  4:22 PM  Result Value Ref Range   Phosphorus 1.7 (L) 2.5 - 4.6 mg/dL    Comment: Performed at [facility] [address], [place], [place] [address]  Glucose, capillary     Status: Abnormal   Collection Time: 04/01/19  7:19 PM  Result Value Ref Range   Glucose-Capillary 119 (H) 70 - 99 mg/dL  Triglycerides     Status: None   Collection Time: 04/01/19  7:59 PM  Result Value Ref Range   Triglycerides 50 <150 mg/dL    Comment: Performed at [facility], [place] [address] [place], [place] [address]  Glucose, capillary     Status: Abnormal   Collection Time: 04/01/19 11:19 PM   Result Value Ref Range   Glucose-Capillary 137 (H) 70 - 99 mg/dL  Glucose, capillary     Status: Abnormal   Collection Time: 04/02/19  3:27 AM  Result Value Ref Range   Glucose-Capillary 141 (H) 70 - 99 mg/dL  BMET in AM     Status: Abnormal   Collection Time: 04/02/19  3:49 AM  Result Value Ref Range   Sodium 148 (H) 135 - 145 mmol/L   Potassium 2.7 (LL) 3.5 - 5.1 mmol/L    Comment: CRITICAL RESULT CALLED TO, READ BACK BY AND VERIFIED WITH: Katheren Puller [id number]    Chloride 124 (H) 98 - 111 mmol/L   CO2 17 (L) 22 - 32 mmol/L   Glucose, Bld 142 (H) 70 - 99 mg/dL   BUN 15 8 - 23 mg/dL   Creatinine, Ser 0.98 0.61 - 1.24 mg/dL   Calcium 7.1 (L) 8.9 - 10.3 mg/dL   GFR calc non Af Amer >60 >60 mL/min   GFR calc Af Amer >60 >60 mL/min   Anion gap 7 5 - 15    Comment: Performed at [facility], [place] [address] [place], [place] [address]  Magnesium     Status: Abnormal   Collection Time: 04/02/19  3:49 AM  Result Value Ref Range   Magnesium 1.6 (L) 1.7 - 2.4 mg/dL    Comment: Performed at [facility] [address], [place], [place] [address]  Phosphorus     Status: Abnormal   Collection Time: 04/02/19  3:49 AM  Result Value Ref Range   Phosphorus 1.2 (L) 2.5 - 4.6 mg/dL    Comment: Performed at [facility] [address], [place], [place] [address]    Imaging /  Studies: No results found.  Medications / Allergies: per chart  Antibiotics: Anti-infectives (From admission, onward)   Start     Dose/Rate Route Frequency Ordered Stop   03/30/19 0600  cefoTEtan (CEFOTAN) 2 g in sodium chloride 0.9 % 100 mL IVPB    Note to Pharmacy: Pharmacy may adjust dose strength for optimal dosing.   Send with patient on call to the OR.  Anesthesia to complete antibiotic administration <54min prior to incision per [facility].   2 g 200 mL/hr over 30 Minutes Intravenous To Short Stay 04/04/2019 1335 04/17/2019 1559   04/16/2019 2200  cefoTEtan (CEFOTAN) 2 g in sodium  chloride 0.9 % 100 mL IVPB     2 g 200 mL/hr over 30 Minutes Intravenous Every 12 hours 04/11/2019 1902 04/11/2019 2214   03/28/2019 1730  vancomycin (VANCOCIN) 1,250 mg in sodium chloride 0.9 % 250 mL IVPB  Status:  Discontinued     1,250 mg 166.7 mL/hr over 90 Minutes Intravenous Every 24 hours 04/16/2019 1720 04/18/2019 0928   04/20/2019 1400  neomycin (MYCIFRADIN) tablet 1,000 mg  Status:  Discontinued     1,000 mg Oral 3 times per day 04/15/2019 0620 04/22/2019 0839   04/18/2019 1400  metroNIDAZOLE (FLAGYL) tablet 1,000 mg  Status:  Discontinued     1,000 mg Oral 3 times per day 04/16/2019 0620 04/22/2019 0839   04/14/2019 1013  vancomycin variable dose per unstable renal function (pharmacist dosing)  Status:  Discontinued      Does not apply See admin instructions 04/19/2019 1014 04/02/2019 1908   04/25/2019 0930  metroNIDAZOLE (FLAGYL) IVPB 500 mg  Status:  Discontinued     500 mg 100 mL/hr over 60 Minutes Intravenous Every 8 hours 04/19/2019 0920 04/01/19 1054   04/03/2019 0630  cefoTEtan (CEFOTAN) 2 g in sodium chloride 0.9 % 100 mL IVPB  Status:  Discontinued     2 g 200 mL/hr over 30 Minutes Intravenous On call to O.R. 04/08/2019 [id number] 04/21/2019 0839   04/11/2019 0630  clindamycin (CLEOCIN) 900 mg, gentamicin (GARAMYCIN) 240 mg in sodium chloride 0.9 % 1,000 mL for intraperitoneal lavage  Status:  Discontinued      Irrigation To Surgery 04/13/2019 0620 04/14/2019 0840   04/02/2019 0500  ceFEPIme (MAXIPIME) 2 g in sodium chloride 0.9 % 100 mL IVPB     2 g 200 mL/hr over 30 Minutes Intravenous Every 12 hours 04/27/2019 1720     04/27/2019 1630  ceFEPIme (MAXIPIME) 2 g in sodium chloride 0.9 % 100 mL IVPB     2 g 200 mL/hr over 30 Minutes Intravenous  Once 04/27/2019 1617 04/04/2019 1804   03/31/2019 1630  metroNIDAZOLE (FLAGYL) IVPB 500 mg     500 mg 100 mL/hr over 60 Minutes Intravenous  Once 04/21/2019 1617 04/09/2019 1928   04/06/2019 1630  vancomycin (VANCOCIN) IVPB 1000 mg/200 mL premix  Status:  Discontinued     1,000 mg 200 mL/hr  over 60 Minutes Intravenous  Once 04/02/2019 1617 03/30/2019 1626   04/20/2019 1630  vancomycin (VANCOCIN) 1,500 mg in sodium chloride 0.9 % 500 mL IVPB     1,500 mg 250 mL/hr over 120 Minutes Intravenous  Once 04/20/2019 1626 04/11/2019 2157        Note: Portions of this report may have been transcribed using voice recognition software. Every effort was made to ensure accuracy; however, inadvertent computerized transcription errors may be present.   Any transcriptional errors that result from this process are unintentional.  Adin Hector, MD, FACS, MASCRS Gastrointestinal and Minimally Invasive Surgery    [address] [place], [place] [address] [phone] Main / Paging [phone] Fax

## 2019-04-02 NOTE — Progress Notes (Signed)
Assisted tele visit to patient with family member.  Meleane Selinger R, RN  

## 2019-04-02 NOTE — Progress Notes (Signed)
NAME:  Darryl Johns, MRN:  [id number], DOB:  1935-04-28, LOS: 5 ADMISSION DATE:  04/05/2019, CONSULTATION DATE:   REFERRING MD: Velna Ochs MD, CHIEF COMPLAINT:  GI bleed  Brief History   84 yo male former smoker presented to ER with altered mental status after falling.  Found to have hematemesis and melena with hemorrhagic shock.  CT imaging showed transverse colon mass and SB volvulus.  Required ventilatory support and pressors.  Past Medical History  RBBB, Sensorimotor axonal peripheral neuropathy, HTN, allergies, colon mass, Sjogren's syndrome, Emphysema  Significant Hospital Events   10/02 laparotomy with Rt colectomy and end ileostomy, jejunal SB resection with anastomosis, wedge bx of liver mass, application of wound vac over closed fascia  10/06 off pressors  Consults:  GI  Procedures:  ETT 10/02 >>  Rt [place] CVL 10/02 >>   Significant Diagnostic Tests:  CTA 10/1 >> no PE, right lower lobe consolidation.  1.4 cm left upper lobe nodule.  Emphysema, aortic, coronary atherosclerosis. CT head / neck 10/1 >> chronic ischemic changes.  No acute abnormalities.  Micro Data:  COVID 10/1 >> negative  Tracheal aspirate 10/4 >>  BCx2 10/1 >>   Antimicrobials:  Vanc 10/1 >> 10/2 Cefepime 10/1 >> Flagyl 10/1 >> 10/5  Interim history/subjective:  Pressure support.  Heart rate up.  Getting electrolyte replacements.  Objective   Blood pressure 124/79, pulse (!) 117, temperature 98.9 F (37.2 C), resp. rate 17, height 6\' 2"  (1.88 m), weight 73.3 kg, SpO2 100 %.    Vent Mode: CPAP;PSV FiO2 (%):  [30 %] 30 % Set Rate:  [16 bmp] 16 bmp Vt Set:  [490 mL] 490 mL PEEP:  [5 cmH20] 5 cmH20 Pressure Support:  [10 cmH20] 10 cmH20 Plateau Pressure:  [16 cmH20-20 cmH20] 20 cmH20   Intake/Output Summary (Last 24 hours) at 04/02/2019 0816 Last data filed at 04/02/2019 0800 Gross per 24 hour  Intake 4608.89 ml  Output 1010 ml  Net 3598.89 ml   Filed Weights   03/31/19 0335  04/01/19 0234 04/02/19 0340  Weight: 67.1 kg 69.6 kg 73.3 kg    Examination:.  General - sedated Eyes - pupils reactive ENT - ETT in place Cardiac - regular, tachycardic, no murmur Chest - b/l crackles Abdomen - wound vac, ileostomy in place Extremities - 2+ edema Skin - no rashes Neuro - RASS -2  CXR (reviewed by me) - b/l interstitial edema   Resolved Hospital Problem list   Lactic Acidosis, Hemorrhagic shock, Acute GI bleed, Elevated ammonia in setting of GI bleed  Assessment & Plan:    Acute hypoxic respiratory failure. Emphysema. Interstitial edema. - pressure support as able; not ready for extubation trial yet - f/u CXR - lasix 40 mg IV x one 10/06 - prn BDs  Lt upper lobe nodule. - will need assessment as outpt  Colon mass with liver mass, SB volvulus, GI bleed s/p laparotomy. Severe protein calorie malnutrition. - post op care, nutrition per [facility] - f/u Hb and transfuse for count less than 7 or significant bleeding - protonix bid - add lovenox for DVT prophylaxis when okay with surgery  Aspiration pneumonia. - day 6/7 of ABx, currently on cefepime  Hypokalemia, hypomagnesemia, hypophosphatemia. - replace as needed  Acute metabolic encephalopathy. Recent w/u for peripheral neuropathy with concern for Sjogren's syndrome. - RASS goal 0 to -1  Sinus tachycardia. - prn lopressor 2.5 mg for HR > 115  Best practice:  Diet: tube feeds DVT prophylaxis: SCDs GI prophylaxis: protonix  Mobility: Bed Code Status: Full Code   Disposition: ICU  Labs    CMP Latest Ref Rng & Units 04/02/2019 04/01/2019 03/31/2019  Glucose 70 - 99 mg/dL 142(H) 116(H) 100(H)  BUN 8 - 23 mg/dL 15 14 17   Creatinine 0.61 - 1.24 mg/dL 0.98 1.10 1.06  Sodium 135 - 145 mmol/L 148(H) 148(H) 141  Potassium 3.5 - 5.1 mmol/L 2.7(LL) 3.3(L) 3.0(L)  Chloride 98 - 111 mmol/L 124(H) 120(H) 114(H)  CO2 22 - 32 mmol/L 17(L) 22 22  Calcium 8.9 - 10.3 mg/dL 7.1(L) 8.6(L) 8.0(L)  Total Protein  6.5 - 8.1 g/dL - 4.6(L) -  Total Bilirubin 0.3 - 1.2 mg/dL - 2.0(H) -  Alkaline Phos 38 - 126 U/L - 62 -  AST 15 - 41 U/L - 37 -  ALT 0 - 44 U/L - 33 -   CBC Latest Ref Rng & Units 04/02/2019 04/01/2019 03/31/2019  WBC 4.0 - 10.5 K/uL 27.1(H) 23.2(H) -  Hemoglobin 13.0 - 17.0 g/dL 10.7(L) 10.2(L) 10.1(L)  Hematocrit 39.0 - 52.0 % 31.8(L) 29.5(L) 28.5(L)  Platelets 150 - 400 K/uL 213 201 -   ABG    Component Value Date/Time   PHART 7.463 (H) 03/30/2019 0308   PCO2ART 28.2 (L) 03/30/2019 0308   PO2ART 80.0 (L) 03/30/2019 0308   HCO3 20.1 03/30/2019 0308   TCO2 21 (L) 03/30/2019 0308   ACIDBASEDEF 3.0 (H) 03/30/2019 0308   O2SAT 96.0 03/30/2019 0308   CBG (last 3)  Recent Labs    04/01/19 1919 04/01/19 2319 04/02/19 0327  GLUCAP 119* 137* 141*    CC time 34 minutes  Chesley Mires, MD [facility] Pulmonary/Critical Care 04/02/2019, 8:31 AM

## 2019-04-03 ENCOUNTER — Inpatient Hospital Stay (HOSPITAL_COMMUNITY): Payer: Medicare HMO

## 2019-04-03 DIAGNOSIS — K922 Gastrointestinal hemorrhage, unspecified: Secondary | ICD-10-CM

## 2019-04-03 DIAGNOSIS — F411 Generalized anxiety disorder: Secondary | ICD-10-CM

## 2019-04-03 LAB — CBC
HCT: 32.1 % — ABNORMAL LOW (ref 39.0–52.0)
Hemoglobin: 10.9 g/dL — ABNORMAL LOW (ref 13.0–17.0)
MCH: 27.9 pg (ref 26.0–34.0)
MCHC: 34 g/dL (ref 30.0–36.0)
MCV: 82.3 fL (ref 80.0–100.0)
Platelets: 208 10*3/uL (ref 150–400)
RBC: 3.9 MIL/uL — ABNORMAL LOW (ref 4.22–5.81)
RDW: 20.5 % — ABNORMAL HIGH (ref 11.5–15.5)
WBC: 28.4 10*3/uL — ABNORMAL HIGH (ref 4.0–10.5)
nRBC: 0 % (ref 0.0–0.2)

## 2019-04-03 LAB — GLUCOSE, CAPILLARY
Glucose-Capillary: 133 mg/dL — ABNORMAL HIGH (ref 70–99)
Glucose-Capillary: 110 mg/dL — ABNORMAL HIGH (ref 70–99)
Glucose-Capillary: 112 mg/dL — ABNORMAL HIGH (ref 70–99)
Glucose-Capillary: 111 mg/dL — ABNORMAL HIGH (ref 70–99)
Glucose-Capillary: 143 mg/dL — ABNORMAL HIGH (ref 70–99)
Glucose-Capillary: 161 mg/dL — ABNORMAL HIGH (ref 70–99)

## 2019-04-03 LAB — BASIC METABOLIC PANEL
Anion gap: 8 (ref 5–15)
BUN: 21 mg/dL (ref 8–23)
CO2: 20 mmol/L — ABNORMAL LOW (ref 22–32)
Calcium: 7.9 mg/dL — ABNORMAL LOW (ref 8.9–10.3)
Chloride: 118 mmol/L — ABNORMAL HIGH (ref 98–111)
Creatinine, Ser: 1.12 mg/dL (ref 0.61–1.24)
GFR calc Af Amer: >60 mL/min (ref >60)
GFR calc non Af Amer: >60 mL/min (ref >60)
Glucose, Bld: 128 mg/dL — ABNORMAL HIGH (ref 70–99)
Potassium: 2.5 mmol/L — CL (ref 3.5–5.1)
Sodium: 146 mmol/L — ABNORMAL HIGH (ref 135–145)

## 2019-04-03 LAB — MAGNESIUM: Magnesium: 1.9 mg/dL (ref 1.7–2.4)

## 2019-04-03 LAB — PROTIME-INR
INR: 1.8 — ABNORMAL HIGH (ref 0.8–1.2)
Prothrombin Time: 20.6 seconds — ABNORMAL HIGH (ref 11.4–15.2)

## 2019-04-03 LAB — PHOSPHORUS: Phosphorus: 2.4 mg/dL — ABNORMAL LOW (ref 2.5–4.6)

## 2019-04-03 MED ORDER — DEXMEDETOMIDINE HCL IN NACL 400 MCG/100ML IV SOLN
0.0000 ug/kg/h | INTRAVENOUS | Status: DC
  Administered 2019-04-03: 0.6 ug/kg/h via INTRAVENOUS
  Administered 2019-04-03: 19:00:00 0.2 ug/kg/h via INTRAVENOUS
  Filled 2019-04-03 (×2): qty 100

## 2019-04-03 MED ORDER — PANTOPRAZOLE SODIUM 40 MG PO PACK
40.0000 mg | PACK | Freq: Two times a day (BID) | ORAL | Status: DC
  Administered 2019-04-03 – 2019-04-08 (×12): 40 mg
  Filled 2019-04-03 (×13): qty 20

## 2019-04-03 MED ORDER — POTASSIUM PHOSPHATE NICU ORAL SYRINGE 4.4 MEQ/ML: 20.0000 mmol | Freq: Two times a day (BID) | ORAL | Status: DC

## 2019-04-03 MED ORDER — OMEPRAZOLE 2 MG/ML ORAL SUSPENSION: 20.0000 mg | Freq: Two times a day (BID) | ORAL | Status: DC

## 2019-04-03 MED ORDER — NOREPINEPHRINE 4 MG/250ML-% IV SOLN
0.0000 ug/min | INTRAVENOUS | Status: DC
  Administered 2019-04-03: 2 ug/min via INTRAVENOUS
  Filled 2019-04-03: qty 250

## 2019-04-03 MED ORDER — FUROSEMIDE 10 MG/ML IJ SOLN
40.0000 mg | Freq: Once | INTRAMUSCULAR | Status: AC
  Administered 2019-04-03: 13:00:00 40 mg via INTRAVENOUS
  Filled 2019-04-03: qty 4

## 2019-04-03 MED ORDER — POTASSIUM CHLORIDE 20 MEQ PO PACK
40.0000 meq | PACK | ORAL | Status: AC
  Administered 2019-04-03 (×2): 40 meq via ORAL
  Filled 2019-04-03 (×2): qty 2

## 2019-04-03 MED ORDER — POTASSIUM CHLORIDE 10 MEQ/50ML IV SOLN
10.0000 meq | INTRAVENOUS | Status: AC
  Administered 2019-04-03 (×4): 10 meq via INTRAVENOUS
  Filled 2019-04-03 (×4): qty 50

## 2019-04-03 MED ORDER — K PHOS MONO-SOD PHOS DI & MONO 155-852-130 MG PO TABS: 500.0000 mg | ORAL_TABLET | Freq: Two times a day (BID) | ORAL | Status: DC

## 2019-04-03 MED ORDER — PIVOT 1.5 CAL PO LIQD
1000.0000 mL | ORAL | Status: DC
  Administered 2019-04-03 – 2019-04-12 (×10): 1000 mL
  Filled 2019-04-03 (×13): qty 1000

## 2019-04-03 MED ORDER — ENOXAPARIN SODIUM 40 MG/0.4ML ~~LOC~~ SOLN
40.0000 mg | Freq: Every day | SUBCUTANEOUS | Status: DC
  Administered 2019-04-03 – 2019-04-07 (×5): 40 mg via SUBCUTANEOUS
  Filled 2019-04-03 (×6): qty 0.4

## 2019-04-03 MED ORDER — VITAL AF 1.2 CAL PO LIQD
1000.0000 mL | ORAL | Status: DC
  Administered 2019-04-03: 12:00:00 1000 mL

## 2019-04-03 NOTE — Progress Notes (Addendum)
Oaklee Sunga [id number] 1934/08/11  CARE TEAM:  PCP: Kristie Cowman, MD  Outpatient Care Team: Patient Care Team: Kristie Cowman, MD as PCP - General (Family Medicine) Debara Pickett, Nadean Corwin, MD as Consulting Physician (Cardiology) Michael Boston, MD as Consulting Physician (Colon and Rectal Surgery) Coral Spikes, MD as Consulting Physician (Gastroenterology) Kathrynn Ducking, MD as Consulting Physician (Neurology)  Inpatient Treatment Team: Treatment Team: Attending Provider: Chesley Mires, MD; Rounding Team: Pccm, Md, MD; Registered Nurse: Tenna Child, RN; Consulting Physician: Edison Pace, Md, MD; Consulting Physician: Michael Boston, MD; Registered Nurse: Romona Curls, RN; Registered Nurse: Lance Sell, RN; Registered Nurse: Antonietta Breach, RN; Registered Nurse: Landry Corporal, RN; Utilization Review: Micah Noel, RN   Problem List:   Principal Problem:   Acute ischemia of jejunum s/p SB resection 04/05/2019 Active Problems:   Cancer of transverse colon pT4b, 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   Protein-calorie malnutrition, severe   Lung mass - primary vs colon metastasis   Essential hypertension   RBBB   Dizziness   Hypotension   Peripheral neuropathy   Gait abnormality   Sepsis ([place])   HOH (hard of hearing)   Coffee ground emesis   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  Probable cancer  Jejunal ischemia due to adhesions to transverse colon cancer and omentum causing closed-loop volvulus   PROCEDURE:  EXPLORATORY LAPAROTOMY  EXTENDED RIGHT COLECTOMY WITH END ILEOSTOMY / HARTMANN JEJUNAL SMALL BOWEL RESECTION WITH ANASTOMOSIS WEDGE BIOPSY OF LIVER MASS APPLICATION OF WOUND VAC OVER CLOSED FASCIA  SURGEON:  Adin Hector, MD  OR FINDINGS:  Bulky mid transverse colonic mass very inflamed.  Suspicious for possible microperforation but no feculent contamination.  Moderate ascites.  Proximal jejunum just distal ligament of Treitz x2 feet twisted around to the colon tumor and trapped by omentum causing a volvulus and closed-loop type obstruction.  Distal jejunum with edema.  Ileum without inflammation. Patient has a side-to-side staple anastomosis of the very proximal jejunum at the ligament of Treitz to mid jejunum isoperistaltic.  Proximal colon including tumor excised en bloc with proximal jejunum.  End ileostomy in right supraumbilical paramedian region.  Prolene sutures on the proximal Hartmann staple line at the distal transverse colon near the splenic flexure.  Hard fibrotic 5 mm nodule on the dome of the right liver.  Wedge biopsy done.  No other evidence of any intraperitoneal carcinomatosis.  PATHOLOGY:  TRANSVERSE COLON CANCER mpT4b, pN1a   A. COLON JEJUNUM, RIGHT PROXIMAL, RESECTION:  - Right hemicolectomy specimen showing 2 separate tumors  1) Invasive poorly differentiated adenocarcinoma, 12 cm, involving  transverse colon, with marked necrosis  - Carcinoma invades through the serosa into adherent jejunum  - Lymphovascular invasion is present   2) Invasive moderately differentiated adenocarcinoma, 1.6 cm, involving  transverse colon (2.8 cm proximal to the larger tumor)  - Carcinoma invades into pericolonic soft tissue  - Lymphovascular invasion is present   - Resection margins are negative for carcinoma  - Metastatic carcinoma to one of  thirty-six lymph nodes (1/36). See  comment  - Unremarkable appendix  - See oncology table   B. LIVER, NODULE, WEDGE:  - Benign liver parenchyma  showing a fibrotic nodule with calcification  - Negative for ova, parasites,granulomas or malignancy   Assessment  Guarded but stabilizing  [facility] Stay = 6 days)  Plan:  OK to gradually advance tube feeds to goal since patient not in shock and having bowel function.  ICU protocol.  Hold TFs if extubating.  I suspect pt will need swallow eval post extubating as higher aspiration risk (elderly, frial, HOH, ?Prkinsonian/dementia?).  OK for corpak feeding tube placement to ensure full nutrition  Minimal vent settings.  Weaning parameters for critical care.  Weaning trial OK this AM - ?maybe extubate today.   Most likely in 1-2 days after diuresing  Good urine output and normal creatinine argues against renal failure.  I ordered enoxaparin postop - deleted.  INR = 2 yesterday so naturally coagulopathic.  No evid of active bleeding.  I reordered enoxaparin (hold if INR still > 2)  Protonix twice daily.  Switch to omeprazole per NGT  Low K, Phos  & Mg - replacing aggressively.  Anticipating will need significant replacement given being severely malnourished and diuresed.  Explained to nursing that patient most likely will get double orders to try and catch up (which is why I did that yesterday & he is STILL low).  We will try and switch to enteral supplementation.  Ileostomy care.  Usually patients in the long-term developed high output ileostomy requiring a antidiarrheal regimen of psyllium, iron, & as needed Imodium or Lomotil.  We will have to see how this plays out.  Sometimes people need a PICC line and IV fluids for 6 weeks to avoid dehydration.  We will see  Wound VAC is on the SQ.  Change Monday Wednesday Friday.  Can switch to packing if closed down well by Friday  Pathology:  Adenocarcinoma of transverse colon invading into the jejunum.  Stage III.  Ideally would benefit from post adjuvant chemotherapy.  Suspect with his advanced age he would just get post-adjuvant oral  capecitabine/Xeloda.  Clearly we need to wait to see how he will do with the emergency surgery.  Dr. Burr Medico aware and will follow up with him as an outpatient.  No need to continue IV antibiotics from a surgical standpoint.  He did not have frank severe peritonitis and is been 72 hours since surgery.  Defer to critical care/medicine if they feel he needs it for pulmonary or other concerns.    45 minutes spent in review, evaluation, examination, counseling, and coordination of care.  More than 50% of that time was spent in counseling.  I updated the patient's status to the patient's nurse.  Recommendations were made.  Questions were answered.  She expressed understanding & appreciation.    04/03/2019    Subjective: (Chief complaint)  More alert and interactive today.  Nursing and respiratory therapy in room.  Patient seems to be more responsive and interactive.  Remains off pressors.  Diuresed.  Objective:  Vital signs:  Vitals:   04/03/19 0600 04/03/19 0700 04/03/19 0717 04/03/19 0724  BP: (!) 103/54 125/61 125/61   Pulse: (!) 102 (!) 113 (!) 121   Resp: (!) 25 (!) 28 (!) 34   Temp:    97.8 F (36.6 C)  TempSrc:    Axillary  SpO2: 97% 98% 97%   Weight:      Height:  Last BM Date: 04/02/19  Intake/Output   Yesterday:  10/06 0701 - 10/07 0700 In: 2731.4 [I.V.:1259.5; NG/GT:480; IV Piggyback:991.9] Out: 5726 [Urine:4370; Stool:325] This shift:  No intake/output data recorded.  Bowel function:  Flatus: YES  BM:  YES  Drain: (No drain)   Physical Exam:  General: Pt awake/alert/oriented x4 in mild acute distress Eyes: PERRL, normal EOM.  Sclera clear.  No icterus Neuro: CN II-XII intact w/o focal sensory/motor deficits. Lymph: No head/neck/groin lymphadenopathy Psych:  No delerium/psychosis/paranoia HENT: Normocephalic, Mucus membranes moist.  No thrush NG tube in place.  ET tube in place Neck: Supple, No tracheal deviation Chest: No chest wall pain w  good excursion CV:  Pulses intact.  Regular rhythm MS: Normal AROM mjr joints.  No obvious deformity  Abdomen: Soft.  Nondistended.  Nontender.  Ileostomy right upper quadrant pink and edematous.  Wound VAC in midline incision clean.  No evidence of peritonitis.  No incarcerated hernias.  Ext:  1-2+ edema.  No deformity.  No mjr edema.  No cyanosis Skin: No petechiae / purpura  Results:   PATHOLOGY DIAGNOSIS:   A. COLON JEJUNUM, RIGHT PROXIMAL, RESECTION:  - Right hemicolectomy specimen showing 2 separate tumors  1) Invasive poorly differentiated adenocarcinoma, 12 cm, involving  transverse colon, with marked necrosis  - Carcinoma invades through the serosa into adherent jejunum  - Lymphovascular invasion is present   2) Invasive moderately differentiated adenocarcinoma, 1.6 cm, involving  transverse colon (2.8 cm proximal to the larger tumor)  - Carcinoma invades into pericolonic soft tissue  - Lymphovascular invasion is present   - Resection margins are negative for carcinoma  - Metastatic carcinoma to one of thirty-six lymph nodes (1/36). See  comment  - Unremarkable appendix  - See oncology table   B. LIVER, NODULE, WEDGE:  - Benign liver parenchyma showing a fibrotic nodule with calcification  - Negative for ova, parasites,granulomas or malignancy    COMMENT:   The histomorphology of metastatic carcinoma in the lymph node is  suggestive of metastasis from the smaller, moderately differentiated  adenocarcinoma.    ONCOLOGY TABLE:   COLON AND RECTUM: Resection, Including Transanal Disk Excision of  Rectal Neoplasms   Procedure: Resection, partial right colon and proximal jejunum  Tumor Site: Transverse colon  Tumor Size: 12.0 cm  Macroscopic Tumor Perforation: Not identified  Histologic Type: Adenocarcinoma  Histologic Grade: G3: Poorly differentiated  Tumor Extension: Tumor invades through serosa into adherent jejunum  Margins: Uninvolved by tumor   Treatment Effect: No known presurgical therapy  Lymphovascular Invasion: Present  Perineural Invasion: Not identified  Tumor Deposits: Not identified  Regional Lymph Nodes:    Number of Lymph Nodes Involved: 1    Number of Lymph Nodes Examined: 36  Pathologic Stage Classification (pTNM, AJCC 8th Edition): mpT4b, pN1a  Ancillary Studies: MMR / MSItesting will be ordered.  Representative Tumor Block: A8  Comments: A separate, invasive moderately differential adenocarcinoma  measuring 1.6 cm was identified 2.8 cm proximal to the larger tumor   (v4.1.0.0)      Obi Scrima DESCRIPTION:   A: Specimen: Received fresh labeled right colon and proximal jejunum  Specimen integrity: Specimen consists of an unoriented segment of small  intestine with 2 stapled resection margins, and attached portion of  colon (including terminal ileum, right colon, ascending, hepatic flexure  and portion of transverse colon)  Specimen length: Separate segment of jejunum measures 57.5 cm in length,  and the colon specimen displays 3.0 cm of terminal ileum and 45 cm  of  colon.  Mesorectal intactness: Nonapplicable  Tumor location: Within the transverse colon, and extending through the  wall into the adjacent adherent portion of jejunum. Possible second  lesion identified, see mucosal polyp heading.  Tumor size: There is a 12.0 x 8.1 cm tan-gray, firm, circumferential  mass within the distal portion of the colon. The lesion appears to  invade through the wall, and extends into the lumen of the attached  additional portion of jejunum. Adipose tissue surrounding the jejunum  is indurated and slightly nodular.  Percent of bowel circumference involved: 100% of colon circumference,  and approximately 50% of small bowel circumference  Tumor distance to margins:            Proximal: 28.0 cm to the terminal ileum resection  margin            Distal: 5.3 cm from the distal colon resection   margin            Mesenteric (sigmoid and transverse): The serosa and  adipose tissue underlying the bulky colon tumor is puckered and  indurated. The indurated tissue measures approximately 1.9 cm from the  presumed mesenteric margin.            Radial (posterior ascending, posterior descending;  lateral and posterior mid-rectum; and entire lower 1/3 rectum):  Nonapplicable  Macroscopic extent of tumor invasion: Sectioning through the flattened  polyp proximal to the primary lesion reveals a tan-white solid cut  surface, which grossly extends into the muscularis propria and possible  adipose tissue. Sectioning through the primary colon lesion reveals a  tan-white to red cut surface, invading through the muscularis propria  into the surrounding adipose tissue.  Total presumed lymph nodes: 36 tan-gray possible lymph nodes are  identified, ranging from 0.2 cm to 1.4 x 1.0 0.7 cm.  Extramural satellite tumor nodules: None identified  Mucosal polyp(s): 2.8 cm proximal to the main colon lesion there is a  1.6 x 1.3 x 0.2 cm tan-brown, firm, flattened possible secondary lesion  identified. No additional polyps are identified.  Additional findings: Tumor invading into the jejunum measures 23.5 cm to  the closest margin and 33.0 cm from the opposing small bowel margin.  The segment of jejunum is partially twisted around the indurated adipose  tissue. The uninvolved colonic mucosa is tan-gray with normal folding.  The appendix is present, measures 6.0 cm in length and up to 0.9 cm in  diameter. The serosal surface is tan-gray and smooth, the mucosa is  tan, the wall measures up to 0.2 cm in thickness, the lumen is  moderately dilated and filled with an abundant amount of tan-yellow  fecal material. The indurated and puckered serosa and adipose tissue  underlying the bulky tumor is inked black. The mucosa of the uninvolved  small bowel is tan-gray, slightly dusky and  focally edematous.  Block summary:  1 = terminal ileum resection margin  2 = distal colon resection margin  3 = jejunal margin closest to lesion  4 = jejunal margin distal to lesion  5-7 = smaller, secondary colon lesion  8 and 9 = cross-section from colon to jejunum  10-14 = representative sections of primary lesion  15 = grossly uninvolved mucosa  16 = appendix  17 and 18 = 5 possible lymph nodes, each  19 and 20 = 4 possible lymph nodes, each  21-23 = 3 possible lymph nodes, each  24 and 25 = 2 possible lymph nodes, each  26 = 2  possible lymph nodes, differentially inked and bisected  27-29 = 1 possible lymph node bisected, each  30 = tissue for molecular studies   B: The specimen is received fresh and consists of a 1.6 x 1.6 x 0.5 cm  wedge portion of tan-pink, nodular soft tissue. The cut surface  displays a 0.3 cm in greatest dimension tan-white, firm, calcified  nodule. The specimen is bisected and entirely submitted in 1 cassette  following decalcification.      Final Diagnosis performed by Jaquita Folds, MD.  Electronically  signed 04/01/2019  Technical and / or Professional components performed at [facility]. [facility], [place] [address] [place], [place] [address].  Immunohistochemistry Technical component (if applicable) was performed  at [facility]. [address] [place],  [place], [place] [address].  IMMUNOHISTOCHEMISTRY DISCLAIMER (if applicable):  Some of these immunohistochemical stains may have been developed and the  performance characteristics determine by [facility]. Some  may not have been cleared or approved by the U.S. Food and Drug  Administration. The FDA has determined that such clearance or approval  is not necessary. This test is used for clinical purposes. It should not  be regarded as investigational or for research. This laboratory is  certified under the [place]  (CLIA-88) as qualified to perform high complexity clinical laboratory  testing. The controls stained appropriately.   Cultures: Recent Results (from the past 720 hour(s))  SARS Coronavirus 2 [facility] order, Performed in [facility] hospital lab) Nasopharyngeal Nasopharyngeal Swab     Status: None   Collection Time: 04/11/2019  4:03 PM   Specimen: Nasopharyngeal Swab  Result Value Ref Range Status   SARS Coronavirus 2 NEGATIVE NEGATIVE Final    Comment: (NOTE) If result is NEGATIVE SARS-CoV-2 target nucleic acids are NOT DETECTED. The SARS-CoV-2 RNA is generally detectable in upper and lower  respiratory specimens during the acute phase of infection. The lowest  concentration of SARS-CoV-2 viral copies this assay can detect is 250  copies / mL. A negative result does not preclude SARS-CoV-2 infection  and should not be used as the sole basis for treatment or other  patient management decisions.  A negative result may occur with  improper specimen collection / handling, submission of specimen other  than nasopharyngeal swab, presence of viral mutation(s) within the  areas targeted by this assay, and inadequate number of viral copies  (<250 copies / mL). A negative result must be combined with clinical  observations, patient history, and epidemiological information. If result is POSITIVE SARS-CoV-2 target nucleic acids are DETECTED. The SARS-CoV-2 RNA is generally detectable in upper and lower  respiratory specimens dur ing the acute phase of infection.  Positive  results are indicative of active infection with SARS-CoV-2.  Clinical  correlation with patient history and other diagnostic information is  necessary to determine patient infection status.  Positive results do  not rule out bacterial infection or co-infection with other viruses. If result is PRESUMPTIVE POSTIVE SARS-CoV-2 nucleic acids MAY BE PRESENT.   A presumptive positive result was obtained on the submitted  specimen  and confirmed on repeat testing.  While 2019 novel coronavirus  (SARS-CoV-2) nucleic acids may be present in the submitted sample  additional confirmatory testing may be necessary for epidemiological  and / or clinical management purposes  to differentiate between  SARS-CoV-2 and other Sarbecovirus currently known to infect humans.  If clinically indicated additional testing with an alternate test  methodology [phone]) is advised. The SARS-CoV-2 RNA is generally  detectable in upper and lower respiratory sp ecimens during the acute  phase of infection. The expected result is Negative. Fact Sheet for Patients:  StrictlyIdeas.no Fact Sheet for Healthcare Providers: BankingDealers.co.za This test is not yet approved or cleared by the [place] FDA and has been authorized for detection and/or diagnosis of SARS-CoV-2 by FDA under an Emergency Use Authorization (EUA).  This EUA will remain in effect (meaning this test can be used) for the duration of the COVID-19 declaration under Section 564(b)(1) of the Act, 21 U.S.C. section 360bbb-3(b)(1), unless the authorization is terminated or revoked sooner. Performed at [facility], [place] [address] [place], [place] [address]   Blood culture (routine x 2)     Status: None   Collection Time: 04/17/2019  7:55 PM   Specimen: BLOOD LEFT HAND  Result Value Ref Range Status   Specimen Description BLOOD LEFT HAND  Final   Special Requests   Final    BOTTLES DRAWN AEROBIC AND ANAEROBIC Blood Culture adequate volume   Culture   Final    NO GROWTH 5 DAYS Performed at [facility], [place] [address], [place], [place] [address]    Report Status 04/02/2019 FINAL  Final  Blood culture (routine x 2)     Status: None   Collection Time: 04/22/2019  8:04 PM   Specimen: BLOOD RIGHT HAND  Result Value Ref Range Status   Specimen Description BLOOD RIGHT HAND  Final   Special Requests   Final     BOTTLES DRAWN AEROBIC ONLY Blood Culture results may not be optimal due to an inadequate volume of blood received in culture bottles   Culture   Final    NO GROWTH 5 DAYS Performed at [facility], [place] [address] [place], [place] [address]    Report Status 04/02/2019 FINAL  Final  Urine culture     Status: None   Collection Time: 04/17/2019 12:35 AM   Specimen: Urine, Catheterized  Result Value Ref Range Status   Specimen Description URINE, CATHETERIZED  Final   Special Requests NONE  Final   Culture   Final    NO GROWTH Performed at [facility], [place] [address] [place], [place] [address]    Report Status 03/30/2019 FINAL  Final  MRSA PCR Screening     Status: None   Collection Time: 04/19/2019 12:39 PM   Specimen: Nasal Mucosa; Nasopharyngeal  Result Value Ref Range Status   MRSA by PCR NEGATIVE NEGATIVE Final    Comment:        The GeneXpert MRSA Assay (FDA approved for NASAL specimens only), is one component of a comprehensive MRSA colonization surveillance program. It is not intended to diagnose MRSA infection nor to guide or monitor treatment for MRSA infections. Performed at [facility], [place] [address] [place], [place] [address]   Culture, respiratory (non-expectorated)     Status: None   Collection Time: 03/31/19 11:20 AM   Specimen: Tracheal Aspirate; Respiratory  Result Value Ref Range Status   Specimen Description TRACHEAL ASPIRATE  Final   Special Requests NONE  Final   Gram Stain   Final    RARE WBC PRESENT, PREDOMINANTLY PMN RARE YEAST Performed at [facility], [place] [address], [place], [place] [address]    Culture FEW CANDIDA ALBICANS  Final   Report Status 04/02/2019 FINAL  Final    Labs: Results for  orders placed or performed during the hospital encounter of 04/26/2019 (from the past 48 hour(s))  Protime-INR     Status: Abnormal   Collection Time: 04/01/19  7:50 AM  Result Value Ref Range   Prothrombin Time 22.2 (H) 11.4 -  15.2 seconds   INR 2.0 (H) 0.8 - 1.2    Comment: (NOTE) INR goal varies based on device and disease states. Performed at [facility], [place] [address] [place], [place] [address]   Glucose, capillary     Status: None   Collection Time: 04/01/19  7:52 AM  Result Value Ref Range   Glucose-Capillary 96 70 - 99 mg/dL  Ammonia     Status: None   Collection Time: 04/01/19 10:32 AM  Result Value Ref Range   Ammonia 22 9 - 35 umol/L    Comment: Performed at [facility], [place] [address] [place], [place] [address]  Cortisol     Status: None   Collection Time: 04/01/19 11:36 AM  Result Value Ref Range   Cortisol, Plasma 35.1 ug/dL    Comment: (NOTE) AM    6.7 - 22.6 ug/dL PM   <10.0       ug/dL Performed at [place] [address], [place], [place] [address]   Glucose, capillary     Status: None   Collection Time: 04/01/19 12:28 PM  Result Value Ref Range   Glucose-Capillary 96 70 - 99 mg/dL  Glucose, capillary     Status: None   Collection Time: 04/01/19  3:29 PM  Result Value Ref Range   Glucose-Capillary 99 70 - 99 mg/dL  Magnesium     Status: None   Collection Time: 04/01/19  4:22 PM  Result Value Ref Range   Magnesium 1.9 1.7 - 2.4 mg/dL    Comment: Performed at [facility], [place] [address], [place], [place] [address]  Phosphorus     Status: Abnormal   Collection Time: 04/01/19  4:22 PM  Result Value Ref Range   Phosphorus 1.7 (L) 2.5 - 4.6 mg/dL    Comment: Performed at [facility] [address], [place], [place] [address]  Glucose, capillary     Status: Abnormal   Collection Time: 04/01/19  7:19 PM  Result Value Ref Range   Glucose-Capillary 119 (H) 70 - 99 mg/dL  Triglycerides     Status: None   Collection Time: 04/01/19  7:59 PM  Result Value Ref Range   Triglycerides 50 <150 mg/dL    Comment: Performed at [facility], [place] [address] [place], [place] [address]  Glucose, capillary     Status: Abnormal   Collection Time:  04/01/19 11:19 PM  Result Value Ref Range   Glucose-Capillary 137 (H) 70 - 99 mg/dL  Glucose, capillary     Status: Abnormal   Collection Time: 04/02/19  3:27 AM  Result Value Ref Range   Glucose-Capillary 141 (H) 70 - 99 mg/dL  BMET in AM     Status: Abnormal   Collection Time: 04/02/19  3:49 AM  Result Value Ref Range   Sodium 148 (H) 135 - 145 mmol/L   Potassium 2.7 (LL) 3.5 - 5.1 mmol/L    Comment: CRITICAL RESULT CALLED TO, READ BACK BY AND VERIFIED WITH: Katheren Puller [phone] [id number] WILDERK    Chloride 124 (H) 98 - 111 mmol/L   CO2 17 (L) 22 - 32 mmol/L   Glucose, Bld 142 (H) 70 - 99 mg/dL  BUN 15 8 - 23 mg/dL   Creatinine, Ser 0.98 0.61 - 1.24 mg/dL   Calcium 7.1 (L) 8.9 - 10.3 mg/dL   GFR calc non Af Amer >60 >60 mL/min   GFR calc Af Amer >60 >60 mL/min   Anion gap 7 5 - 15    Comment: Performed at [facility] [address], [place], [place] [address]  Magnesium     Status: Abnormal   Collection Time: 04/02/19  3:49 AM  Result Value Ref Range   Magnesium 1.6 (L) 1.7 - 2.4 mg/dL    Comment: Performed at [facility] [address] [place], [place] [address]  Phosphorus     Status: Abnormal   Collection Time: 04/02/19  3:49 AM  Result Value Ref Range   Phosphorus 1.2 (L) 2.5 - 4.6 mg/dL    Comment: Performed at [facility] [address] [place], [place] [address]  Glucose, capillary     Status: Abnormal   Collection Time: 04/02/19  7:42 AM  Result Value Ref Range   Glucose-Capillary 163 (H) 70 - 99 mg/dL  CBC     Status: Abnormal   Collection Time: 04/02/19  7:51 AM  Result Value Ref Range   WBC 27.1 (H) 4.0 - 10.5 K/uL   RBC 3.83 (L) 4.22 - 5.81 MIL/uL   Hemoglobin 10.7 (L) 13.0 - 17.0 g/dL   HCT 31.8 (L) 39.0 - 52.0 %   MCV 83.0 80.0 - 100.0 fL   MCH 27.9 26.0 - 34.0 pg   MCHC 33.6 30.0 - 36.0 g/dL   RDW 20.1 (H) 11.5 - 15.5 %   Platelets 213 150 - 400 K/uL   nRBC 0.0 0.0 - 0.2 %    Comment: Performed at [facility], [place] [address], [place], [place] [address]  Glucose, capillary     Status: Abnormal   Collection Time: 04/02/19 12:07 PM  Result Value Ref Range   Glucose-Capillary 184 (H) 70 - 99 mg/dL  Basic metabolic panel     Status: Abnormal   Collection Time: 04/02/19  1:00 PM  Result Value Ref Range   Sodium 148 (H) 135 - 145 mmol/L   Potassium 3.1 (L) 3.5 - 5.1 mmol/L   Chloride 118 (H) 98 - 111 mmol/L   CO2 20 (L) 22 - 32 mmol/L   Glucose, Bld 193 (H) 70 - 99 mg/dL   BUN 18 8 - 23 mg/dL   Creatinine, Ser 1.20 0.61 - 1.24 mg/dL   Calcium 8.4 (L) 8.9 - 10.3 mg/dL   GFR calc non Af Amer 56 (L) >60 mL/min   GFR calc Af Amer >60 >60 mL/min   Anion gap 10 5 - 15    Comment: Performed at [facility] [address] [place], [place] [address]  Phosphorus     Status: None   Collection Time: 04/02/19  1:00 PM  Result Value Ref Range   Phosphorus 2.8 2.5 - 4.6 mg/dL    Comment: Performed at [facility] [address] [place], [place] [address]  Glucose, capillary     Status: Abnormal   Collection Time: 04/02/19  3:27 PM  Result Value Ref Range   Glucose-Capillary 171 (H) 70 - 99 mg/dL  Magnesium     Status: None   Collection Time: 04/02/19  4:41 PM  Result Value Ref Range   Magnesium 2.0 1.7 - 2.4 mg/dL    Comment: Performed at [facility]  [facility], [place] [address] [place], [place] [address]  Phosphorus     Status: None   Collection Time: 04/02/19  4:41 PM  Result Value Ref Range   Phosphorus 3.3 2.5 - 4.6 mg/dL    Comment: Performed at [facility] [address], [place], [place] [address]  Glucose, capillary     Status: Abnormal   Collection Time: 04/02/19  8:16 PM  Result Value Ref Range   Glucose-Capillary 159 (H) 70 - 99 mg/dL  Glucose, capillary     Status: Abnormal   Collection Time: 04/02/19 11:54 PM  Result Value Ref Range   Glucose-Capillary 45 (L) 70 - 99 mg/dL  Glucose, capillary     Status: Abnormal   Collection Time: 04/02/19 11:56 PM  Result Value Ref Range    Glucose-Capillary 147 (H) 70 - 99 mg/dL  Glucose, capillary     Status: Abnormal   Collection Time: 04/03/19  3:25 AM  Result Value Ref Range   Glucose-Capillary 133 (H) 70 - 99 mg/dL  Magnesium     Status: None   Collection Time: 04/03/19  3:26 AM  Result Value Ref Range   Magnesium 1.9 1.7 - 2.4 mg/dL    Comment: Performed at [facility], [place] [address] [place], [place] [address]  Phosphorus     Status: Abnormal   Collection Time: 04/03/19  3:26 AM  Result Value Ref Range   Phosphorus 2.4 (L) 2.5 - 4.6 mg/dL    Comment: Performed at [facility] [address], [place], [place] [address]  CBC     Status: Abnormal   Collection Time: 04/03/19  3:26 AM  Result Value Ref Range   WBC 28.4 (H) 4.0 - 10.5 K/uL   RBC 3.90 (L) 4.22 - 5.81 MIL/uL   Hemoglobin 10.9 (L) 13.0 - 17.0 g/dL   HCT 32.1 (L) 39.0 - 52.0 %   MCV 82.3 80.0 - 100.0 fL   MCH 27.9 26.0 - 34.0 pg   MCHC 34.0 30.0 - 36.0 g/dL   RDW 20.5 (H) 11.5 - 15.5 %   Platelets 208 150 - 400 K/uL   nRBC 0.0 0.0 - 0.2 %    Comment: Performed at [facility], [place] [address] [place], [place] [address]  Glucose, capillary     Status: Abnormal   Collection Time: 04/03/19  7:29 AM  Result Value Ref Range   Glucose-Capillary 110 (H) 70 - 99 mg/dL    Imaging / Studies: Dg Chest Port 1 View  Result Date: 04/02/2019 CLINICAL DATA:  Acute respiratory failure with hypoxia EXAM: PORTABLE CHEST 1 VIEW COMPARISON:  Chest radiograph 04/23/2019 FINDINGS: The endotracheal tube tip is 4.1 cm above the carina. Nasogastric tube enters the stomach. A right subclavian central line is noted with tip projecting over the right atrium. Worsened indistinct perihilar and basilar opacities with interstitial accentuation. Hazy accentuated density at the lung bases with some mild blunting of the lateral costophrenic angles. Heart size within normal limits. Atherosclerotic calcification of the aortic arch. Vague gaseous density along the  liver, nonspecific for free air versus air within bowel. However the patient did have exploratory laparotomy four days ago. IMPRESSION: 1. Worsened bilateral interstitial and perihilar and basilar airspace opacities. Possibilities may include drug reaction, atypical pneumonia, or noncardiogenic edema. 2. Hazy obscuration of the lung bases, possibly from atelectasis although a component of pleural effusion cannot be excluded given the mild blunting of both costophrenic angles laterally. 3.  Aortic Atherosclerosis (ICD10-I70.0). 4. Tubes and lines satisfactorily position. 5. Questionable free intraperitoneal gas. The patient had exploratory laparotomy 4 days ago and thus this is not considered unexpected even if it does represent free intraperitoneal gas. Electronically Signed   By: Van Clines M.D.   On: 04/02/2019 08:42    Medications / Allergies: per chart  Antibiotics: Anti-infectives (From admission, onward)   Start     Dose/Rate Route Frequency Ordered Stop   03/30/19 0600  cefoTEtan (CEFOTAN) 2 g in sodium chloride 0.9 % 100 mL IVPB    Note to Pharmacy: Pharmacy may adjust dose strength for optimal dosing.   Send with patient on call to the OR.  Anesthesia to complete antibiotic administration <62mn [facility]   2 g 200 mL/hr over 30 Minutes Intravenous To Short Stay 04/01/2019 1335 04/23/2019 1559   04/21/2019 2200  cefoTEtan (CEFOTAN) 2 g in sodium chloride 0.9 % 100 mL IVPB     2 g 200 mL/hr over 30 Minutes Intravenous Every 12 hours 04/11/2019 1902 04/09/2019 2214   04/10/2019 1730  vancomycin (VANCOCIN) 1,250 mg in sodium chloride 0.9 % 250 mL IVPB  Status:  Discontinued     1,250 mg 166.7 mL/hr over 90 Minutes Intravenous Every 24 hours 04/03/2019 1720 04/03/2019 0928   04/15/2019 1400  neomycin (MYCIFRADIN) tablet 1,000 mg  Status:  Discontinued     1,000 mg Oral 3 times per day 04/25/2019 0620 04/21/2019 0839   04/11/2019 1400  metroNIDAZOLE (FLAGYL) tablet 1,000 mg   Status:  Discontinued     1,000 mg Oral 3 times per day 04/23/2019 0620 04/25/2019 0839   04/19/2019 1013  vancomycin variable dose per unstable renal function (pharmacist dosing)  Status:  Discontinued      Does not apply See admin instructions 04/18/2019 1014 04/24/2019 1908   03/30/2019 0930  metroNIDAZOLE (FLAGYL) IVPB 500 mg  Status:  Discontinued     500 mg 100 mL/hr over 60 Minutes Intravenous Every 8 hours 04/22/2019 0920 04/01/19 1054   04/26/2019 0630  cefoTEtan (CEFOTAN) 2 g in sodium chloride 0.9 % 100 mL IVPB  Status:  Discontinued     2 g 200 mL/hr over 30 Minutes Intravenous On call to O.R. 04/15/2019 0644010/22/2020 0839   04/15/2019 0630  clindamycin (CLEOCIN) 900 mg, gentamicin (GARAMYCIN) 240 mg in sodium chloride 0.9 % 1,000 mL for intraperitoneal lavage  Status:  Discontinued      Irrigation To Surgery 04/06/2019 0620 04/15/2019 0840   04/20/2019 0500  ceFEPIme (MAXIPIME) 2 g in sodium chloride 0.9 % 100 mL IVPB     2 g 200 mL/hr over 30 Minutes Intravenous Every 12 hours 04/01/2019 1720     04/24/2019 1630  ceFEPIme (MAXIPIME) 2 g in sodium chloride 0.9 % 100 mL IVPB     2 g 200 mL/hr over 30 Minutes Intravenous  Once 04/25/2019 1617 04/07/2019 1804   04/11/2019 1630  metroNIDAZOLE (FLAGYL) IVPB 500 mg     500 mg 100 mL/hr over 60 Minutes Intravenous  Once 04/07/2019 1617 04/27/2019 1928   04/21/2019 1630  vancomycin (VANCOCIN) IVPB 1000 mg/200 mL premix  Status:  Discontinued     1,000 mg 200 mL/hr over 60 Minutes Intravenous  Once 04/03/2019 1617 04/09/2019 1626   04/21/2019 1630  vancomycin (VANCOCIN) 1,500 mg in sodium chloride 0.9 % 500 mL IVPB     1,500 mg 250 mL/hr over 120 Minutes Intravenous  Once 04/15/2019 1626 04/13/2019 2157  Note: Portions of this report may have been transcribed using voice recognition software. Every effort was made to ensure accuracy; however, inadvertent computerized transcription errors may be present.   Any transcriptional errors that result from this process are  unintentional.     Adin Hector, MD, FACS, MASCRS Gastrointestinal and Minimally Invasive Surgery    [address], [place], [place] [address] [phone] Main / Paging [phone] Fax

## 2019-04-03 NOTE — Progress Notes (Addendum)
NAME:  Darryl Johns, MRN:  [id number], DOB:  1935/06/18, LOS: 6 ADMISSION DATE:  04/24/2019, CONSULTATION DATE:   REFERRING MD: Velna Ochs MD, CHIEF COMPLAINT:  GI bleed  Brief History   84 yo male former smoker presented to ER with altered mental status after falling.  Found to have hematemesis and melena with hemorrhagic shock.  CT imaging showed transverse colon mass and SB volvulus.  Required ventilatory support and pressors.  Past Medical History  RBBB, Sensorimotor axonal peripheral neuropathy, HTN, allergies, colon mass, Sjogren's syndrome, Emphysema  Significant Hospital Events   10/02 laparotomy with Rt colectomy and end ileostomy, jejunal SB resection with anastomosis, wedge bx of liver mass, application of wound vac over closed fascia  10/06 off pressors  Consults:  GI  Procedures:  ETT 10/02 >>  Rt [facility] CVL 10/02 >>   Significant Diagnostic Tests:  CTA 10/1 >> no PE, right lower lobe consolidation.  1.4 cm left upper lobe nodule.  Emphysema, aortic, coronary atherosclerosis. CT head / neck 10/1 >> chronic ischemic changes.  No acute abnormalities.  Micro Data:  COVID 10/1 >> negative  Tracheal aspirate 10/4 >>  BCx2 10/1 >> negative  Antimicrobials:  Vanc 10/1 >> 10/2 Cefepime 10/1 >> Flagyl 10/1 >> 10/5  Interim history/subjective:  Low K+, electrolyte replaced, continue pressure support  Objective   Blood pressure (!) 108/52, pulse (!) 113, temperature 97.8 F (36.6 C), temperature source Axillary, resp. rate (!) 21, height 6\' 2"  (1.88 m), weight 71.8 kg, SpO2 97 %.    Vent Mode: PRVC FiO2 (%):  [30 %-40 %] 40 % Set Rate:  [16 bmp] 16 bmp Vt Set:  [490 mL] 490 mL PEEP:  [5 cmH20] 5 cmH20 Plateau Pressure:  [12 [id number] cmH20] 12 cmH20   Intake/Output Summary (Last 24 hours) at 04/03/2019 0902 Last data filed at 04/03/2019 0800 Gross per 24 hour  Intake 2146.57 ml  Output 4875 ml  Net -2728.43 ml   Filed Weights   04/01/19 0234 04/02/19 0340  04/03/19 0332  Weight: 69.6 kg 73.3 kg 71.8 kg    Examination:.  General - Awake, intubated HEENT: pupils reactive,  ETT in place Cardiac - tachycardic, irregular, , no murmurs, rubs, or gallops Chest - Bilateral crackles Abdomen - wound vac, ileostomy in place, hyperactive bowel sounds Extremities - 2+ edema to theknee Skin - no rashes Neuro - RASS -1   Resolved Hospital Problem list   Lactic Acidosis, Hemorrhagic shock, Acute GI bleed, Elevated ammonia in setting of GI bleed  Assessment & Plan:    Acute hypoxic respiratory failure. Emphysema. Interstitial edema. - pressure support as able; not ready for extubation trial yet - lasix 40mg  IV x 1 dose 10/07 - prn BDs  Lt upper lobe nodule. - will need assessment as outpt  Colon mass with liver mass, SB volvulus, GI bleed s/p laparotomy. Severe protein calorie malnutrition. - post op care, nutrition per [facility] - f/u Hb and transfuse for count less than 7 or significant bleeding - protonix bid - add lovenox for DVT prophylaxis when okay with surgery  Aspiration pneumonia. - day 7/7 of ABx, currently on cefepime  Hypokalemia, hypomagnesemia, hypophosphatemia. K 2.5 - Replace with IV K - Repeat BMET  Acute metabolic encephalopathy. Recent w/u for peripheral neuropathy with concern for Sjogren's syndrome. - RASS goal 0 to -1  Sinus tachycardia. - prn lopressor 2.5 mg for HR > 115  Best practice:  Diet: tube feeds DVT prophylaxis: SCDs GI prophylaxis: protonix Mobility: Bed  Code Status: Full Code   Disposition: ICU  Labs    CMP Latest Ref Rng & Units 04/03/2019 04/02/2019 04/02/2019  Glucose 70 - 99 mg/dL 128(H) 193(H) 142(H)  BUN 8 - 23 mg/dL 21 18 15   Creatinine 0.61 - 1.24 mg/dL 1.12 1.20 0.98  Sodium 135 - 145 mmol/L 146(H) 148(H) 148(H)  Potassium 3.5 - 5.1 mmol/L 2.5(LL) 3.1(L) 2.7(LL)  Chloride 98 - 111 mmol/L 118(H) 118(H) 124(H)  CO2 22 - 32 mmol/L 20(L) 20(L) 17(L)  Calcium 8.9 - 10.3 mg/dL 7.9(L)  8.4(L) 7.1(L)  Total Protein 6.5 - 8.1 g/dL - - -  Total Bilirubin 0.3 - 1.2 mg/dL - - -  Alkaline Phos 38 - 126 U/L - - -  AST 15 - 41 U/L - - -  ALT 0 - 44 U/L - - -   CBC Latest Ref Rng & Units 04/03/2019 04/02/2019 04/01/2019  WBC 4.0 - 10.5 K/uL 28.4(H) 27.1(H) 23.2(H)  Hemoglobin 13.0 - 17.0 g/dL 10.9(L) 10.7(L) 10.2(L)  Hematocrit 39.0 - 52.0 % 32.1(L) 31.8(L) 29.5(L)  Platelets 150 - 400 K/uL 208 213 201   ABG    Component Value Date/Time   PHART 7.463 (H) 03/30/2019 0308   PCO2ART 28.2 (L) 03/30/2019 0308   PO2ART 80.0 (L) 03/30/2019 0308   HCO3 20.1 03/30/2019 0308   TCO2 21 (L) 03/30/2019 0308   ACIDBASEDEF 3.0 (H) 03/30/2019 0308   O2SAT 96.0 03/30/2019 0308   CBG (last 3)  Recent Labs    04/02/19 2356 04/03/19 0325 04/03/19 0729  GLUCAP 147* 133* [place]    Renee Rival, MS4

## 2019-04-03 NOTE — Progress Notes (Signed)
PT anxious, coughing, & trying to talk on vent at 2030. RR high 30's. Precedex resumed at 0.3 and PRN versed 1 mg given. BP decreasing (systolic [id number]) with increase in sedation.CVP 2. Notified E-link. PT BP Improved (systolic [id number]) with decrease in sedation, but PT remained agitated when decreased. Did not receive Dr. Deterding's attempts to contact me. Orders received for Art line and Levophed at 2217. Requested to delay start of Art-line due to PT's potential extubation in the AM and past requirement of low dose Levophed. BP now 97/48 on 4 of Levophed. Will continue to monitor PT for changes.

## 2019-04-03 NOTE — Progress Notes (Signed)
 Nutrition Follow-up  DOCUMENTATION CODES:   Severe malnutrition in context of chronic illness  INTERVENTION:   Tube Feeding:  Switch to Pivot 1.5 at 30 ml/hr, goal rate of 55 ml/hr Provides 1980 kcals, 124 g of protein and 1003 mL of free water  Pt would likely benefit from exchanging out NG tube for Cortrak/Small bore feeding tube prior to extubation as expect pt will initially not be able to meet nutritional needs via po route alone  NUTRITION DIAGNOSIS:   Severe Malnutrition related to cancer and cancer related treatments, chronic illness(colon cancer, SBO) as evidenced by severe fat depletion, severe muscle depletion, energy intake < 75% for > or equal to 1 month.  Being addressed via nutrition support  GOAL:   Patient will meet greater than or equal to 90% of their needs  Progressing  MONITOR:   Diet advancement, Labs, Weight trends, Skin  REASON FOR ASSESSMENT:   Other (Comment)    ASSESSMENT:   84 yo male admitted with shock, aspiration pneumonia with incidental finding of lung nodule on CT, suspected colon cancer with possible GI bleed, AKI. PMH includes HTN, peripheral neuropathy  10/02 Intubated, Ex Lap, Right Colectomy with End Ileostomy, Jejunal SB resection with anastomosis, wedge biopsy of liver mass, application of wound VAC over closed fascia 10/05 Trickle TF initiated 10/06 Off pressors 10/07 Started TF advancement  Patient is currently intubated on ventilator support MV: 17.3 L/min Temp (24hrs), Avg:99 F (37.2 C), Min:97.8 F (36.6 C), Max:99.9 F (37.7 C)  Tolerating Vital AF 1.2 at 20 ml/hr via NG tube  Ileostomy: 325 mL of stool in previous 24 hours; monitor output with advancement in TF Wound Vac: no output UOP >4 L; net + 16 L per I/O flow sheet  Labs: sodium 146 (H), potassium 2.5 (L), phosphorus 2.4 (L), magnesium wdl Meds: D5 at 30 ml/hr, KCl   Diet Order:   Diet Order            Diet NPO time specified  Diet effective now               EDUCATION NEEDS:   Not appropriate for education at this time  Skin:  Skin Assessment: Skin Integrity Issues: Skin Integrity Issues:: Wound VAC Wound Vac: surgical abdominal incision  Last BM:  10/07 Ileostomy  Height:   Ht Readings from Last 1 Encounters:  04/21/2019 6\' 2"  (1.88 m)    Weight:   Wt Readings from Last 1 Encounters:  04/03/19 71.8 kg    Ideal Body Weight:  86.4 kg  BMI:  Body mass index is 20.32 kg/m.  Estimated Nutritional Needs:   Kcal:  1833 kcals  Protein:  100-120 g  Fluid:  >/= 1.5 L   Hamdan Toscano MS, RDN, LDN, CNSC [phone] Pager  [phone] Weekend/On-Call Pager

## 2019-04-03 NOTE — Progress Notes (Signed)
OK to hold off on a-line.

## 2019-04-03 NOTE — Progress Notes (Signed)
RT was walking passed patient room and heard ventilator alarm.  Checked alarm and patient was ringing out low minute ventilation and heard gurgling from ETT.  ETT noted to be at 21, advanced back to 23 where had been charted.  Patient was also noted to be on CPAP/PSV.  Patient's heart rate noted to be 120s-140s and respiratory rate of 37.  Placed patient back on full support ventilation and RN gave bolus of medication.  Will continue to monitor.

## 2019-04-03 NOTE — Consult Note (Signed)
[facility] Nurse wound follow up Wound type: surgical  Measurement:15cm x 0.2cm x 0.2cm  Wound bed: 100% clean subcutaneous tissue  Drainage (amount, consistency, odor) none in VAC canister  Periwound:maceration over the umbilicus despite protection with VAC drape.  Dressing procedure/placement/frequency: Removed old NPWT dressing Cleansed wound with normal saline Skin protected over the umbilicus  with VAC drape  Filled wound with  _1__ piece of black foam,  Sealed NPWT dressing at 144mm HG Patient is minimally sedated on the vent Patient tolerated procedure well   WOC nurse will continue to provide NPWT dressing changed due to the complexity of the dressing change.   Darryl Johns, Darryl Johns, Darryl Johns

## 2019-04-04 ENCOUNTER — Inpatient Hospital Stay (HOSPITAL_COMMUNITY): Payer: Medicare HMO

## 2019-04-04 ENCOUNTER — Ambulatory Visit: Payer: Medicare HMO | Admitting: Medical

## 2019-04-04 DIAGNOSIS — R0603 Acute respiratory distress: Secondary | ICD-10-CM

## 2019-04-04 DIAGNOSIS — J439 Emphysema, unspecified: Secondary | ICD-10-CM

## 2019-04-04 DIAGNOSIS — T17908A Unspecified foreign body in respiratory tract, part unspecified causing other injury, initial encounter: Secondary | ICD-10-CM

## 2019-04-04 DIAGNOSIS — J9601 Acute respiratory failure with hypoxia: Secondary | ICD-10-CM

## 2019-04-04 LAB — CBC
HCT: 30.7 % — ABNORMAL LOW (ref 39.0–52.0)
Hemoglobin: 10.6 g/dL — ABNORMAL LOW (ref 13.0–17.0)
MCH: 28.3 pg (ref 26.0–34.0)
MCHC: 34.5 g/dL (ref 30.0–36.0)
MCV: 82.1 fL (ref 80.0–100.0)
Platelets: 220 10*3/uL (ref 150–400)
RBC: 3.74 MIL/uL — ABNORMAL LOW (ref 4.22–5.81)
RDW: 20.9 % — ABNORMAL HIGH (ref 11.5–15.5)
WBC: 32.6 10*3/uL — ABNORMAL HIGH (ref 4.0–10.5)
nRBC: 0 % (ref 0.0–0.2)

## 2019-04-04 LAB — BASIC METABOLIC PANEL
Anion gap: 12 (ref 5–15)
Anion gap: 6 (ref 5–15)
BUN: 32 mg/dL — ABNORMAL HIGH (ref 8–23)
BUN: 32 mg/dL — ABNORMAL HIGH (ref 8–23)
CO2: 20 mmol/L — ABNORMAL LOW (ref 22–32)
CO2: 21 mmol/L — ABNORMAL LOW (ref 22–32)
Calcium: 8.4 mg/dL — ABNORMAL LOW (ref 8.9–10.3)
Calcium: 8.1 mg/dL — ABNORMAL LOW (ref 8.9–10.3)
Chloride: 118 mmol/L — ABNORMAL HIGH (ref 98–111)
Chloride: 123 mmol/L — ABNORMAL HIGH (ref 98–111)
Creatinine, Ser: 1.19 mg/dL (ref 0.61–1.24)
Creatinine, Ser: 1.13 mg/dL (ref 0.61–1.24)
GFR calc Af Amer: >60 mL/min (ref >60)
GFR calc Af Amer: >60 mL/min (ref >60)
GFR calc non Af Amer: 57 mL/min — ABNORMAL LOW (ref >60)
GFR calc non Af Amer: >60 mL/min (ref >60)
Glucose, Bld: 178 mg/dL — ABNORMAL HIGH (ref 70–99)
Glucose, Bld: 195 mg/dL — ABNORMAL HIGH (ref 70–99)
Potassium: 2.7 mmol/L — CL (ref 3.5–5.1)
Potassium: 2.3 mmol/L — CL (ref 3.5–5.1)
Sodium: 150 mmol/L — ABNORMAL HIGH (ref 135–145)
Sodium: 150 mmol/L — ABNORMAL HIGH (ref 135–145)

## 2019-04-04 LAB — POCT I-STAT 7, (LYTES, BLD GAS, ICA,H+H)
Acid-base deficit: 4 mmol/L — ABNORMAL HIGH (ref 0.0–2.0)
Acid-base deficit: 4 mmol/L — ABNORMAL HIGH (ref 0.0–2.0)
Bicarbonate: 19.6 mmol/L — ABNORMAL LOW (ref 20.0–28.0)
Bicarbonate: 20 mmol/L (ref 20.0–28.0)
Calcium, Ion: 1.33 mmol/L (ref 1.15–1.40)
Calcium, Ion: 1.3 mmol/L (ref 1.15–1.40)
HCT: 32 % — ABNORMAL LOW (ref 39.0–52.0)
HCT: 30 % — ABNORMAL LOW (ref 39.0–52.0)
Hemoglobin: 10.9 g/dL — ABNORMAL LOW (ref 13.0–17.0)
Hemoglobin: 10.2 g/dL — ABNORMAL LOW (ref 13.0–17.0)
O2 Saturation: 91 %
O2 Saturation: 92 %
Patient temperature: 100.4
Patient temperature: 100.4
Potassium: 2.5 mmol/L — CL (ref 3.5–5.1)
Potassium: 2.4 mmol/L — CL (ref 3.5–5.1)
Sodium: 156 mmol/L — ABNORMAL HIGH (ref 135–145)
Sodium: 156 mmol/L — ABNORMAL HIGH (ref 135–145)
TCO2: 20 mmol/L — ABNORMAL LOW (ref 22–32)
TCO2: 21 mmol/L — ABNORMAL LOW (ref 22–32)
pCO2 arterial: 29.7 mmHg — ABNORMAL LOW (ref 32.0–48.0)
pCO2 arterial: 33.3 mmHg (ref 32.0–48.0)
pH, Arterial: 7.431 (ref 7.350–7.450)
pH, Arterial: 7.391 (ref 7.350–7.450)
pO2, Arterial: 61 mmHg — ABNORMAL LOW (ref 83.0–108.0)
pO2, Arterial: 66 mmHg — ABNORMAL LOW (ref 83.0–108.0)

## 2019-04-04 LAB — GLUCOSE, CAPILLARY
Glucose-Capillary: 132 mg/dL — ABNORMAL HIGH (ref 70–99)
Glucose-Capillary: 138 mg/dL — ABNORMAL HIGH (ref 70–99)
Glucose-Capillary: 147 mg/dL — ABNORMAL HIGH (ref 70–99)
Glucose-Capillary: 147 mg/dL — ABNORMAL HIGH (ref 70–99)
Glucose-Capillary: 156 mg/dL — ABNORMAL HIGH (ref 70–99)
Glucose-Capillary: 164 mg/dL — ABNORMAL HIGH (ref 70–99)

## 2019-04-04 LAB — PROTIME-INR
INR: 1.7 — ABNORMAL HIGH (ref 0.8–1.2)
Prothrombin Time: 19.8 seconds — ABNORMAL HIGH (ref 11.4–15.2)

## 2019-04-04 LAB — MAGNESIUM: Magnesium: 2 mg/dL (ref 1.7–2.4)

## 2019-04-04 MED ORDER — FENTANYL CITRATE (PF) 100 MCG/2ML IJ SOLN
25.0000 ug | INTRAMUSCULAR | Status: DC | PRN
  Administered 2019-04-04 – 2019-04-06 (×3): 50 ug via INTRAVENOUS
  Administered 2019-04-07 – 2019-04-08 (×2): 100 ug via INTRAVENOUS
  Filled 2019-04-04 (×2): qty 2

## 2019-04-04 MED ORDER — GLYCOPYRROLATE 0.2 MG/ML IJ SOLN
0.1000 mg | Freq: Once | INTRAMUSCULAR | Status: AC
  Administered 2019-04-04: 0.1 mg via INTRAVENOUS
  Filled 2019-04-04: qty 1

## 2019-04-04 MED ORDER — FENTANYL CITRATE (PF) 100 MCG/2ML IJ SOLN: 25.0000 ug | INTRAMUSCULAR | Status: DC | PRN

## 2019-04-04 MED ORDER — ALBUMIN HUMAN 5 % IV SOLN: 12.5000 g | Freq: Four times a day (QID) | INTRAVENOUS | Status: DC | PRN

## 2019-04-04 MED ORDER — POTASSIUM CHLORIDE 20 MEQ/15ML (10%) PO SOLN
40.0000 meq | ORAL | Status: AC
  Administered 2019-04-04 (×2): 40 meq
  Filled 2019-04-04 (×2): qty 30

## 2019-04-04 MED ORDER — LACTATED RINGERS IV BOLUS
250.0000 mL | Freq: Once | INTRAVENOUS | Status: AC
  Administered 2019-04-04: 250 mL via INTRAVENOUS

## 2019-04-04 MED ORDER — MIDAZOLAM HCL 2 MG/2ML IJ SOLN
2.0000 mg | INTRAMUSCULAR | Status: AC | PRN
  Administered 2019-04-04 – 2019-04-06 (×3): 2 mg via INTRAVENOUS
  Filled 2019-04-04 (×3): qty 2

## 2019-04-04 MED ORDER — MIDAZOLAM HCL 2 MG/2ML IJ SOLN
1.0000 mg | INTRAMUSCULAR | Status: DC | PRN
  Administered 2019-04-04: 1 mg via INTRAVENOUS
  Filled 2019-04-04: qty 2

## 2019-04-04 MED ORDER — FENTANYL BOLUS VIA INFUSION
25.0000 ug | INTRAVENOUS | Status: DC | PRN
  Administered 2019-04-04: 50 ug via INTRAVENOUS
  Administered 2019-04-04 – 2019-04-09 (×11): 25 ug via INTRAVENOUS
  Filled 2019-04-04: qty 25

## 2019-04-04 MED ORDER — MIDAZOLAM HCL 2 MG/2ML IJ SOLN
2.0000 mg | INTRAMUSCULAR | Status: DC | PRN
  Administered 2019-04-06 – 2019-04-08 (×10): 2 mg via INTRAVENOUS
  Filled 2019-04-04 (×9): qty 2

## 2019-04-04 MED ORDER — FENTANYL CITRATE (PF) 100 MCG/2ML IJ SOLN: 25.0000 ug | Freq: Once | INTRAMUSCULAR | Status: DC

## 2019-04-04 MED ORDER — GABAPENTIN 250 MG/5ML PO SOLN
100.0000 mg | Freq: Three times a day (TID) | ORAL | Status: DC
  Administered 2019-04-05 – 2019-04-09 (×14): 100 mg
  Filled 2019-04-04 (×15): qty 2

## 2019-04-04 MED ORDER — FENTANYL 2500MCG IN NS 250ML (10MCG/ML) PREMIX INFUSION
25.0000 ug/h | INTRAVENOUS | Status: DC
  Administered 2019-04-04: 21:00:00 25 ug/h via INTRAVENOUS
  Administered 2019-04-05: 50 ug/h via INTRAVENOUS
  Administered 2019-04-06 – 2019-04-08 (×5): 175 ug/h via INTRAVENOUS
  Administered 2019-04-08: 19:00:00 150 ug/h via INTRAVENOUS
  Administered 2019-04-09: 50 ug/h via INTRAVENOUS
  Filled 2019-04-04 (×8): qty 250

## 2019-04-04 MED ORDER — PROPOFOL 10 MG/ML IV BOLUS
50.0000 mg | Freq: Once | INTRAVENOUS | Status: AC
  Administered 2019-04-04: 50 mg via INTRAVENOUS
  Filled 2019-04-04: qty 20

## 2019-04-04 MED ORDER — ALBUMIN HUMAN 5 % IV SOLN
12.5000 g | Freq: Once | INTRAVENOUS | Status: AC
  Administered 2019-04-04: 12.5 g via INTRAVENOUS
  Filled 2019-04-04: qty 250

## 2019-04-04 MED ORDER — KETAMINE HCL-SODIUM CHLORIDE 100-0.9 MG/10ML-% IV SOSY
50.0000 mg | PREFILLED_SYRINGE | Freq: Once | INTRAVENOUS | Status: AC
  Administered 2019-04-04: 50 mg via INTRAVENOUS
  Filled 2019-04-04: qty 10

## 2019-04-04 MED ORDER — FREE WATER
200.0000 mL | Freq: Three times a day (TID) | Status: DC
  Administered 2019-04-04 – 2019-04-05 (×2): 200 mL

## 2019-04-04 MED ORDER — POTASSIUM CHLORIDE 10 MEQ/50ML IV SOLN
10.0000 meq | INTRAVENOUS | Status: AC
  Administered 2019-04-04 – 2019-04-05 (×6): 10 meq via INTRAVENOUS
  Filled 2019-04-04 (×6): qty 50

## 2019-04-04 NOTE — Progress Notes (Signed)
CRITICAL VALUE ALERT  Critical Value:  2.7 K+  Date & Time Notied:  04/04/19  0515  Provider Notified: Margaree Mackintosh MD via Elta Guadeloupe, RN  Orders Received/Actions taken: awaiting orders

## 2019-04-04 NOTE — Procedures (Signed)
Extubation Procedure Note  Patient Details:   Name: Joao Kingman DOB: [birth year] MRN: [id number]   Airway Documentation:    Vent end date: 04/04/19 Vent end time: 1028   Evaluation  O2 sats: stable throughout Complications: No apparent complications Patient did tolerate procedure well. Bilateral BS- Rhonchi  Patient extubated to 2L [place] per MD order. Patient able to speak & cough post extubation.  IS instructed 500-750 x 10.     Kathie Dike 04/04/2019, 10:26 AM

## 2019-04-04 NOTE — Progress Notes (Signed)
Dr. Halford Chessman at bs intermittently since extubation, updated to respiratory assessment and orders recvd at intervals. Continueing to encourage C&DB, assisting w/ secretion removal and occasional NTS.

## 2019-04-04 NOTE — Procedures (Addendum)
Intubation Procedure Note Darryl Johns [id number] 10/15/1934  Procedure: Intubation Indications: Respiratory insufficiency, Airway protection, failure to manage secretions  Procedure Details Consent: Unable to obtain consent because of emergent medical necessity. Time Out: Verified patient identification, verified procedure, site/side was marked, verified correct patient position, special equipment/implants available, medications/allergies/relevent history reviewed, required imaging and test results available.  Performed  Maximum sterile technique was used including cap, gloves, gown, hand hygiene and mask.  Intubated over Bronchoscope Size 7.5 ETT at 24 Confirmed placement with direct visualization on Bronchoscope  RSI: Ketamine 50mg  and Propofol 50mg  Passive Oxygenation with High Flow nasal cannula at 15L  Evaluation Hemodynamic Status: BP stable throughout; O2 sats: stable throughout Patient's Current Condition: stable Complications: No apparent complications Patient did tolerate procedure well. Chest X-ray ordered to verify placement. Not needed tube placement confirmed with direct visualization during Bronchoscopy   Rise Paganini Yoshi Mancillas 04/04/2019

## 2019-04-04 NOTE — Progress Notes (Signed)
CRITICAL VALUE ALERT  Critical Value: K 2.3   Date & Time Notied:  2010  Provider Notified: Elink   Orders Received/Actions taken: Awaiting orders

## 2019-04-04 NOTE — Progress Notes (Signed)
 Nutrition Follow-up  DOCUMENTATION CODES:   Severe malnutrition in context of chronic illness  INTERVENTION:   Tube Feeding:  Continue Pivot 1.5 at 55 ml/hr Provides 1980 kcals, 124 g of protein and 1003 mL of free water Meets 100% estimated calories and protein needs   NUTRITION DIAGNOSIS:   Severe Malnutrition related to cancer and cancer related treatments, chronic illness(colon cancer, SBO) as evidenced by severe fat depletion, severe muscle depletion, energy intake < 75% for > or equal to 1 month.  Being addressed via TF   GOAL:   Patient will meet greater than or equal to 90% of their needs  Progressing  MONITOR:   Diet advancement, Labs, Weight trends, Skin  REASON FOR ASSESSMENT:   Other (Comment)    ASSESSMENT:   84 yo male admitted with shock, aspiration pneumonia with incidental finding of lung nodule on CT, suspected colon cancer with possible GI bleed, AKI. PMH includes HTN, peripheral neuropathy  10/02 Intubated, Ex Lap, Right Colectomy with End Ileostomy, Jejunal SB resection with anastomosis, wedge biopsy of liver mass, application of wound VAC over closed fascia 10/05 Trickle TF initiated 10/06 Off pressors 10/07 Started TF advancement 10/08 Extubated  NG tube remains in place, Plan for Cortrak placement tomorrow  Per Shirlean Mylar RN, pt tolerating Pivot 1.5 well at 55 ml/hr, free water 200 mL q 8 hours  Ileostomy output increased with increase in TF rate but output appropriate  Ileosotmy: 1075 mL No output from wound vac  Labs: sodium 150 (H), potassium 2.7 (L), Creatinine wdl, BUN 32 Meds: reviewed   Diet Order:   Diet Order            Diet NPO time specified  Diet effective now              EDUCATION NEEDS:   Not appropriate for education at this time  Skin:  Skin Assessment: Skin Integrity Issues: Skin Integrity Issues:: Wound VAC Wound Vac: surgical abdominal incision  Last BM:  10/8 Ileostomy  Height:   Ht Readings from  Last 1 Encounters:  04/23/2019 6\' 2"  (1.88 m)    Weight:   Wt Readings from Last 1 Encounters:  04/04/19 71.7 kg    Ideal Body Weight:  86.4 kg  BMI:  Body mass index is 20.29 kg/m.  Estimated Nutritional Needs:   Kcal:  1980-2310 kcals  Protein:  100-130 g  Fluid:  >/= 2 L     Ermias Tomeo MS, RDN, LDN, CNSC [phone] Pager  [phone] Weekend/On-Call Pager

## 2019-04-04 NOTE — Progress Notes (Signed)
6 Days Post-Op    CC:  abd pain  Subjective: He is agitated but they are getting ready to extubate, Staff tells me he was not on Levophed, and low BP was related to Sedation.  Mid line site with wound vac, we will look at it tomorrow.  TF on hold for extubation and expect to get him a Production assistant, radio.    Objective: Vital signs in last 24 hours: Temp:  [98.4 F (36.9 C)-100 F (37.8 C)] 99.6 F (37.6 C) (10/08 0800) Pulse Rate:  [86-122] 108 (10/08 0900) Resp:  [17-43] 31 (10/08 0900) BP: (66-145)/(39-69) 110/53 (10/08 0900) SpO2:  [87 %-100 %] 100 % (10/08 0900) FiO2 (%):  [30 %-40 %] 30 % (10/08 0725) Weight:  [71.7 kg] 71.7 kg (10/08 0350) Last BM Date: 04/03/19 1200 IV 852 tube feeding 2555 urine Stool 1075 T-max 100; hypotensive yesterday at 1500 down to 66/47. Vent 30% with FiO2 100% Potassium 2.7, magnesium 2.0, NA 150 WBC 32.6 H/H 10.6/30.7 Platelets 220 CXR:  Improved aeration at the right base but still extensive bilateral airspace disease Intake/Output from previous day: 10/07 0701 - 10/08 0700 In: 2076.7 [I.V.:746.6; NG/GT:852.5; IV Piggyback:477.6] Out: 8250 [Urine:2555; NLZJQ:7341] Intake/Output this shift: Total I/O In: 231.2 [I.V.:56.3; NG/GT:55; IV Piggyback:119.9] Out: -   General appearance: alert and in [place] and waving arm.   Resp: clear to auscultation bilaterally and anterior exam GI: wound vac in place, tolerating TF with good output from the ostomy.   Just extubated  Lab Results:  Recent Labs    04/03/19 0326 04/04/19 0349  WBC 28.4* 32.6*  HGB 10.9* 10.6*  HCT 32.1* 30.7*  PLT 208 220    BMET Recent Labs    04/03/19 0800 04/04/19 0349  NA 146* 150*  K 2.5* 2.7*  CL 118* 118*  CO2 20* 20*  GLUCOSE 128* 178*  BUN 21 32*  CREATININE 1.12 1.19  CALCIUM 7.9* 8.4*   PT/INR Recent Labs    04/03/19 0800 04/04/19 0349  LABPROT 20.6* 19.8*  INR 1.8* 1.7*    Recent Labs  Lab 04/02/2019 1542 04/24/2019 0730  04/01/19 0454  AST 38 43* 37  ALT <5 21 33  ALKPHOS 77 61 62  BILITOT 0.4 0.5 2.0*  PROT 7.0 6.1* 4.6*  ALBUMIN 2.2* 1.9* 1.5*     Lipase  No results found for: LIPASE   Medications: . chlorhexidine gluconate (MEDLINE KIT)  15 mL Mouth Rinse BID  . Chlorhexidine Gluconate Cloth  6 each Topical Daily  . enoxaparin (LOVENOX) injection  40 mg Subcutaneous Daily  . lip balm  1 application Topical BID  . mouth rinse  15 mL Mouth Rinse 10 times per day  . pantoprazole sodium  40 mg Per Tube BID  . potassium chloride  40 mEq Per Tube Q4H   . sodium chloride Stopped (04/01/19 0728)  . albumin human    . dextrose 5% lactated ringers 10 mL/hr at 04/04/19 0915  . feeding supplement (PIVOT 1.5 CAL) Stopped (04/04/19 0830)  . 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 S/p exploratory laparotomy, extended right  colectomy with end ileostomy/Hartman, jejunal small bowel resection with anastomosis, wedge biopsy of liver mass, application of wound VAC over closed fascia, 04/24/2019,  Dr. Michael Boston POD #6  FEN: Tube feeding/IV fluids/n.p.o./ - currently off VTE: Lovenox HE:[id number] pre op; Flagyl 10/1-10/5; cefepime 10/2-10/7;  Plan:  Hope to see a successful extubation, replacing electrolytes per CCM and restart tube feedings later.      LOS: 7 days    Janielle [place]elstadt 04/04/2019 [phone]

## 2019-04-04 NOTE — Procedures (Signed)
Bronchoscopy Procedure Note Darryl Johns [id number] 1934/10/30  Procedure: Bronchoscopy Indications: Diagnostic evaluation of the airways, Obtain specimens for culture and/or other diagnostic studies and Remove secretions  Procedure Details Consent: Unable to obtain consent because of emergent medical necessity. Time Out: Verified patient identification, verified procedure, site/side was marked, verified correct patient position, special equipment/implants available, medications/allergies/relevent history reviewed, required imaging and test results available.  Performed  In preparation for procedure, patient was given 100% FiO2 and bronchoscope lubricated. Sedation: ketamine 50 mg and propofol 50 mg ( given during intubation)  Airway entered and the following bronchi were examined: RUL, RML, RLL, LUL and LLL.   Findings: erythema of airways Left main and  LLL (seen in images) particularly with re-pooling of gastric like secretions. Therapeutic suctioning performed. All airways patent at the end of procedure.   Procedures performed: Therapeutic suctioning. BAL in LLL sent for cx Bronchoscope removed.  , Patient placed back on 100% FiO2 at conclusion of procedure.          Evaluation Hemodynamic Status: BP stable throughout; O2 sats: stable throughout Patient's Current Condition: stable Specimens:  Sent bilious type fluid Complications: No apparent complications Patient did tolerate procedure well.   Darryl Johns 04/04/2019

## 2019-04-04 NOTE — Progress Notes (Signed)
NAME:  Darryl Johns, MRN:  [id number], DOB:  02/14/1935, LOS: 7 ADMISSION DATE:  04/22/2019, CONSULTATION DATE:   REFERRING MD: Velna Ochs MD, CHIEF COMPLAINT:  GI bleed  Brief History   84 yo male former smoker presented to ER with altered mental status after falling.  Found to have hematemesis and melena with hemorrhagic shock.  CT imaging showed transverse colon mass and SB volvulus.  Required ventilatory support and pressors.  Past Medical History  RBBB, Sensorimotor axonal peripheral neuropathy, HTN, allergies, colon mass, Sjogren's syndrome, Emphysema  Significant Hospital Events   10/02 laparotomy with Rt colectomy and end ileostomy, jejunal SB resection with anastomosis, wedge bx of liver mass, application of wound vac over closed fascia  10/06 off pressors  Consults:  GI  Procedures:  ETT 10/02 >>  Rt Addison CVL 10/02 >>   Significant Diagnostic Tests:  CTA 10/1 >> no PE, right lower lobe consolidation.  1.4 cm left upper lobe nodule.  Emphysema, aortic, coronary atherosclerosis. CT head / neck 10/1 >> chronic ischemic changes.  No acute abnormalities.  Micro Data:  COVID 10/1 >> negative  Tracheal aspirate 10/4 >>  BCx2 10/1 >> negative  Antimicrobials:  Vanc 10/1 >> 10/2 Cefepime 10/1 >> 10/7 Flagyl 10/1 >> 10/5  Interim history/subjective:  Low K+, electrolyte replaced, Responsive head nodding to questions  Objective   Blood pressure 98/60, pulse (!) 111, temperature 99.6 F (37.6 C), temperature source Oral, resp. rate (!) 30, height 6\' 2"  (1.88 m), weight 71.7 kg, SpO2 100 %. CVP:  [2 mmHg] 2 mmHg  Vent Mode: PRVC FiO2 (%):  [30 %-40 %] 30 % Set Rate:  [16 bmp] 16 bmp Vt Set:  [490 mL] 490 mL PEEP:  [5 cmH20] 5 cmH20 Plateau Pressure:  [14 cmH20-20 cmH20] 14 cmH20   Intake/Output Summary (Last 24 hours) at 04/04/2019 [id number] Last data filed at 04/04/2019 0600 Gross per 24 hour  Intake 2046.3 ml  Output 3405 ml  Net -1358.7 ml   Filed Weights   04/02/19 0340 04/03/19 0332 04/04/19 0350  Weight: 73.3 kg 71.8 kg 71.7 kg    Examination:.  General - Awake, intubated HEENT: pupils reactive,  ETT in place Cardiac - tachycardic, regular Rhythm,  no murmurs, rubs, or gallops Chest - bilateral crackles Abdomen - wound vac, ileostomy in place, hyperactive bowel sounds Extremities - 2+ edema to the knee Skin - no rashes Neuro - RASS -1   Resolved Hospital Problem list   Lactic Acidosis, Hemorrhagic shock, Acute GI bleed, Elevated ammonia in setting of GI bleed  Assessment & Plan:    Acute hypoxic respiratory failure. Emphysema. Interstitial edema. - SBT for extubation - prn BDs  Lt upper lobe nodule. - will need assessment as outpt  Colon mass with liver mass, SB volvulus, GI bleed s/p laparotomy. Severe protein calorie malnutrition. - post op care, nutrition per CCS - f/u Hb and transfuse for count less than 7 or significant bleeding - protonix bid - add lovenox for DVT prophylaxis when okay with surgery  Aspiration pneumonia. - completed 7 day course of cefepime  Hypokalemia, hypomagnesemia, hypophosphatemia. K 2.7 - Replace with IV K - Repeat BMET  Hypernatremia Na 150, secondary to fluid removal wit lasix - Repeat BMET  Acute metabolic encephalopathy. Recent w/u for peripheral neuropathy with concern for Sjogren's syndrome. - RASS goal 0 to -1  Sinus tachycardia. - prn lopressor 2.5 mg for HR > 115  Best practice:  Diet: tube feeds DVT prophylaxis: SCDs GI  prophylaxis: protonix Mobility: Bed Code Status: Full Code   Disposition: ICU  Labs    CMP Latest Ref Rng & Units 04/04/2019 04/03/2019 04/02/2019  Glucose 70 - 99 mg/dL 178(H) 128(H) 193(H)  BUN 8 - 23 mg/dL 32(H) 21 18  Creatinine 0.61 - 1.24 mg/dL 1.19 1.12 1.20  Sodium 135 - 145 mmol/L 150(H) 146(H) 148(H)  Potassium 3.5 - 5.1 mmol/L 2.7(LL) 2.5(LL) 3.1(L)  Chloride 98 - 111 mmol/L 118(H) 118(H) 118(H)  CO2 22 - 32 mmol/L 20(L) 20(L) 20(L)   Calcium 8.9 - 10.3 mg/dL 8.4(L) 7.9(L) 8.4(L)  Total Protein 6.5 - 8.1 g/dL - - -  Total Bilirubin 0.3 - 1.2 mg/dL - - -  Alkaline Phos 38 - 126 U/L - - -  AST 15 - 41 U/L - - -  ALT 0 - 44 U/L - - -   CBC Latest Ref Rng & Units 04/04/2019 04/03/2019 04/02/2019  WBC 4.0 - 10.5 K/uL 32.6(H) 28.4(H) 27.1(H)  Hemoglobin 13.0 - 17.0 g/dL 10.6(L) 10.9(L) 10.7(L)  Hematocrit 39.0 - 52.0 % 30.7(L) 32.1(L) 31.8(L)  Platelets 150 - 400 K/uL 220 208 213   ABG    Component Value Date/Time   PHART 7.463 (H) 03/30/2019 0308   PCO2ART 28.2 (L) 03/30/2019 0308   PO2ART 80.0 (L) 03/30/2019 0308   HCO3 20.1 03/30/2019 0308   TCO2 21 (L) 03/30/2019 0308   ACIDBASEDEF 3.0 (H) 03/30/2019 0308   O2SAT 96.0 03/30/2019 0308   CBG (last 3)  Recent Labs    04/03/19 2324 04/04/19 0417 04/04/19 [place]    Renee Rival, MS4

## 2019-04-04 NOTE — Progress Notes (Signed)
[facility] ADULT ICU REPLACEMENT PROTOCOL FOR AM LAB REPLACEMENT ONLY  The patient does apply for the [facility] Adult ICU Electrolyte Replacment Protocol based on the criteria listed below:   1. Is GFR >/= 40 ml/min? Yes.    Patient's GFR today is >60 2. Is urine output >/= 0.5 ml/kg/hr for the last 6 hours? Yes.   Patient's UOP is 0.7 ml/kg/hr 3. Is BUN < 60 mg/dL? Yes.    Patient's BUN today is 32 4. Abnormal electrolyte(s): K2.7 5. Ordered repletion with: per protocol 6. If a panic level lab has been reported, has the CCM MD in charge been notified? Yes.  .   Physician:  Patricia Nettle, MD  [place] Clock 04/04/2019 5:25 AM

## 2019-04-05 LAB — BASIC METABOLIC PANEL
Anion gap: 8 (ref 5–15)
Anion gap: 6 (ref 5–15)
BUN: 34 mg/dL — ABNORMAL HIGH (ref 8–23)
BUN: 35 mg/dL — ABNORMAL HIGH (ref 8–23)
CO2: 21 mmol/L — ABNORMAL LOW (ref 22–32)
CO2: 20 mmol/L — ABNORMAL LOW (ref 22–32)
Calcium: 8.3 mg/dL — ABNORMAL LOW (ref 8.9–10.3)
Calcium: 8.1 mg/dL — ABNORMAL LOW (ref 8.9–10.3)
Chloride: 122 mmol/L — ABNORMAL HIGH (ref 98–111)
Chloride: 123 mmol/L — ABNORMAL HIGH (ref 98–111)
Creatinine, Ser: 1.21 mg/dL (ref 0.61–1.24)
Creatinine, Ser: 1.07 mg/dL (ref 0.61–1.24)
GFR calc Af Amer: >60 mL/min (ref >60)
GFR calc Af Amer: >60 mL/min (ref >60)
GFR calc non Af Amer: 55 mL/min — ABNORMAL LOW (ref >60)
GFR calc non Af Amer: >60 mL/min (ref >60)
Glucose, Bld: 178 mg/dL — ABNORMAL HIGH (ref 70–99)
Glucose, Bld: 157 mg/dL — ABNORMAL HIGH (ref 70–99)
Potassium: 3.4 mmol/L — ABNORMAL LOW (ref 3.5–5.1)
Potassium: 3.4 mmol/L — ABNORMAL LOW (ref 3.5–5.1)
Sodium: 151 mmol/L — ABNORMAL HIGH (ref 135–145)
Sodium: 149 mmol/L — ABNORMAL HIGH (ref 135–145)

## 2019-04-05 LAB — GLUCOSE, CAPILLARY
Glucose-Capillary: 173 mg/dL — ABNORMAL HIGH (ref 70–99)
Glucose-Capillary: 161 mg/dL — ABNORMAL HIGH (ref 70–99)
Glucose-Capillary: 135 mg/dL — ABNORMAL HIGH (ref 70–99)
Glucose-Capillary: 127 mg/dL — ABNORMAL HIGH (ref 70–99)
Glucose-Capillary: 122 mg/dL — ABNORMAL HIGH (ref 70–99)
Glucose-Capillary: 129 mg/dL — ABNORMAL HIGH (ref 70–99)

## 2019-04-05 LAB — CBC
HCT: 29 % — ABNORMAL LOW (ref 39.0–52.0)
Hemoglobin: 9.8 g/dL — ABNORMAL LOW (ref 13.0–17.0)
MCH: 27.8 pg (ref 26.0–34.0)
MCHC: 33.8 g/dL (ref 30.0–36.0)
MCV: 82.4 fL (ref 80.0–100.0)
Platelets: 226 10*3/uL (ref 150–400)
RBC: 3.52 MIL/uL — ABNORMAL LOW (ref 4.22–5.81)
RDW: 21.1 % — ABNORMAL HIGH (ref 11.5–15.5)
WBC: 33.9 10*3/uL — ABNORMAL HIGH (ref 4.0–10.5)
nRBC: 0 % (ref 0.0–0.2)

## 2019-04-05 LAB — MAGNESIUM: Magnesium: 1.9 mg/dL (ref 1.7–2.4)

## 2019-04-05 MED ORDER — INSULIN ASPART 100 UNIT/ML ~~LOC~~ SOLN
0.0000 [IU] | SUBCUTANEOUS | Status: DC
  Administered 2019-04-05 (×3): 2 [IU] via SUBCUTANEOUS
  Administered 2019-04-05: 3 [IU] via SUBCUTANEOUS
  Administered 2019-04-05: 2 [IU] via SUBCUTANEOUS
  Administered 2019-04-05: 3 [IU] via SUBCUTANEOUS
  Administered 2019-04-06: 2 [IU] via SUBCUTANEOUS
  Administered 2019-04-06: 08:00:00 3 [IU] via SUBCUTANEOUS
  Administered 2019-04-06 – 2019-04-07 (×3): 2 [IU] via SUBCUTANEOUS
  Administered 2019-04-07: 21:00:00 3 [IU] via SUBCUTANEOUS
  Administered 2019-04-07 (×4): 2 [IU] via SUBCUTANEOUS
  Administered 2019-04-08 (×2): 3 [IU] via SUBCUTANEOUS
  Administered 2019-04-08: 5 [IU] via SUBCUTANEOUS
  Administered 2019-04-08: 3 [IU] via SUBCUTANEOUS
  Administered 2019-04-08 (×2): 5 [IU] via SUBCUTANEOUS
  Administered 2019-04-08 – 2019-04-09 (×2): 3 [IU] via SUBCUTANEOUS
  Administered 2019-04-09: 5 [IU] via SUBCUTANEOUS
  Administered 2019-04-09: 2 [IU] via SUBCUTANEOUS
  Administered 2019-04-09 (×2): 3 [IU] via SUBCUTANEOUS
  Administered 2019-04-10: 2 [IU] via SUBCUTANEOUS
  Administered 2019-04-10 (×3): 3 [IU] via SUBCUTANEOUS
  Administered 2019-04-11 – 2019-04-12 (×3): 2 [IU] via SUBCUTANEOUS

## 2019-04-05 MED ORDER — MIDAZOLAM HCL 2 MG/2ML IJ SOLN
1.0000 mg | Freq: Four times a day (QID) | INTRAMUSCULAR | Status: DC | PRN
  Filled 2019-04-05: qty 2

## 2019-04-05 MED ORDER — FREE WATER
200.0000 mL | Status: DC
  Administered 2019-04-05 – 2019-04-12 (×35): 200 mL

## 2019-04-05 MED ORDER — SODIUM CHLORIDE 0.9 % IV SOLN
2.0000 g | Freq: Two times a day (BID) | INTRAVENOUS | Status: AC
  Administered 2019-04-05 – 2019-04-08 (×8): 2 g via INTRAVENOUS
  Filled 2019-04-05 (×8): qty 2

## 2019-04-05 MED ORDER — POTASSIUM CHLORIDE 10 MEQ/50ML IV SOLN
10.0000 meq | INTRAVENOUS | Status: AC
  Administered 2019-04-05 – 2019-04-06 (×6): 10 meq via INTRAVENOUS
  Filled 2019-04-05 (×6): qty 50

## 2019-04-05 MED ORDER — POTASSIUM CHLORIDE 20 MEQ/15ML (10%) PO SOLN
40.0000 meq | ORAL | Status: AC
  Administered 2019-04-05 (×2): 40 meq
  Filled 2019-04-05 (×2): qty 30

## 2019-04-05 NOTE — Progress Notes (Addendum)
Nutrition Follow-up  DOCUMENTATION CODES:   Severe malnutrition in context of chronic illness  INTERVENTION:   Tube Feeding:  Continue Pivot 1.5 at 55 ml/hr Provides 1980 kcals, 124 g of protein and 1003 mL of free water   NUTRITION DIAGNOSIS:   Severe Malnutrition related to cancer and cancer related treatments, chronic illness(colon cancer, SBO) as evidenced by severe fat depletion, severe muscle depletion, energy intake < 75% for > or equal to 1 month.  Being addressed via TF   GOAL:   Patient will meet greater than or equal to 90% of their needs  Progressing  MONITOR:   Diet advancement, Labs, Weight trends, Skin  REASON FOR ASSESSMENT:   Other (Comment)    ASSESSMENT:   84 yo male admitted with shock, aspiration pneumonia with incidental finding of lung nodule on CT, suspected colon cancer with possible GI bleed, AKI. PMH includes HTN, peripheral neuropathy  10/02 Intubated, Ex Lap, Right Colectomy with End Ileostomy, Jejunal SB resection with anastomosis, wedge biopsy of liver mass, application of wound VAC over closed fascia 10/05 Trickle TF initiated 10/06 Off pressors 10/07 Started TF advancement 10/08 Extubated, required reintubation  Currently receiving Pivot 1.5 at 55 ml/h via NGT, tolerating well, providing 1980 kcal, 124 gm protein, 1003 ml free water daily. Free water flushes: 200 ml every 4 hours Plans for Cortrak on hold.   Ileostomy: 1050 mL output 10/8 Wound vac: 10 ml output   Patient is currently intubated on ventilator support MV: 15.4 L/min Temp (24hrs), Avg:98.8 F (37.1 C), Min:97.5 F (36.4 C), Max:100.4 F (38 C)  Labs: sodium 151 (H), potassium 3.4 (L) up from 2.3 on 10/8, Creatinine 1.21 wdl, BUN 34 (H) Meds: reviewed   Diet Order:   Diet Order            Diet NPO time specified  Diet effective now              EDUCATION NEEDS:   Not appropriate for education at this time  Skin:  Skin Assessment: Skin Integrity  Issues: Skin Integrity Issues:: Wound VAC Wound Vac: surgical abdominal incision  Last BM:  10/9 ileostomy (1050 ml output on 10/8)  Height:   Ht Readings from Last 1 Encounters:  04/22/2019 6\' 2"  (1.88 m)    Weight:   Wt Readings from Last 1 Encounters:  04/05/19 70.8 kg    Ideal Body Weight:  86.4 kg  BMI:  Body mass index is 20.04 kg/m.  Estimated Nutritional Needs:   Kcal:  2035  Protein:  100-130 g  Fluid:  >/= 2 L    Molli Barrows, RD, LDN, [place] Pager [phone] After Hours Pager [phone]

## 2019-04-05 NOTE — Progress Notes (Signed)
Pharmacy Antibiotic Note  Darryl Johns is a 84 y.o. male admitted on 04/22/2019 with AMS s/p fall and completed a course of antibiotics for rule out sepsis.  Patient was extubated on 04/04/19 and required re-intubation on the same day.  Now with concern for aspiration and Pharmacy has been consulted for cefepime dosing.  SCr 1.21, CrCL 47 ml/min, Tmax 100.4, WBC up to 33.9.  Plan: Cefepime 2gm IV Q12H x 10 days total Monitor renal fxn, clinical progress  Height: 6\' 2"  (188 cm) Weight: 156 lb 1.4 oz (70.8 kg) IBW/kg (Calculated) : 82.2  Temp (24hrs), Avg:98.8 F (37.1 C), Min:97.5 F (36.4 C), Max:100.4 F (38 C)  Recent Labs  Lab 03/31/2019 1240  03/30/19 0332  04/01/19 0454  04/02/19 0751 04/02/19 1300 04/03/19 0326 04/03/19 0800 04/04/19 0349 04/04/19 2141 04/05/19 0422  WBC  --    < >  --    < > 23.2*  --  27.1*  --  28.4*  --  32.6*  --  33.9*  CREATININE  --    < >  --    < > 1.10   < >  --  1.20  --  1.12 1.19 1.13 1.21  LATICACIDVEN 2.2*  --  1.9  --   --   --   --   --   --   --   --   --   --    < > = values in this interval not displayed.    Estimated Creatinine Clearance: 47.1 mL/min (by C-G formula based on SCr of 1.21 mg/dL).    No Known Allergies  Vanc 10/1 >>10/2 Cefepime 10/1 >> 10/8, restart 10/9 >> (10/18) Flagyl 10/1 >>10/5  10/1 COVID - negative 10/1 BCx - negative 10/2 UCx - negative 10/2 MRSA PCR - negative 10/8 TA -  10/9 UCx -  10/9 BCx -   Filomena Pokorney D. Mina Marble, PharmD, BCPS, [place] 04/05/2019, 11:30 AM

## 2019-04-05 NOTE — Consult Note (Signed)
[place] Nurse wound follow up Wound type: surgical  Measurement: see notes from this week, however noted to have some depth today distal portion 1.5cm proximal portion 1.0cm  Wound bed: clean, pink, moist Drainage (amount, consistency, odor) minimal, no odor Periwound: intact, but maceration over the umbilicus despite protection with VAC drape Dressing procedure/placement/frequency: Cleansed wound with normal saline Periwound skin protected with drape over umbilicus  Filled wound with  1___ piece of black foam Sealed NPWT dressing at 183mm HG Pain controlled with IV medication, patient intubated  Patient tolerated procedure well  WOC nurse will continue to provide NPWT dressing changed due to the complexity of the dressing change.   [place] Nurse ostomy follow up Stoma type/location: RLQ, end ileostomy Stomal assessment/size: 1 1/2" budded, pink, os at 6 o'clock  Peristomal assessment: intact  Treatment options for stomal/peristomal skin: using 2" barrier ring to aid in seal due to location of os  Output liquid brown/green Ostomy pouching: 2pc. 2 3/4" with 2" barrier ring Education provided: NA, patient intubated. No family in the room.  Enrolled patient in [place] Start Discharge program: No   WOC Nurse will follow along with you for continued support with ostomy teaching and care/wound care [place] MSN, [place], [place], [place], [place]

## 2019-04-05 NOTE — Progress Notes (Signed)
Pt started having episodes of shaking in his shoulders. He is still able to follow commands and his pupils are a 4, equal and reactive. Dr. Elsworth Soho made aware. No new orders given.

## 2019-04-05 NOTE — Progress Notes (Signed)
7 Days Post-Op    CC: Abdominal pain  Subjective: Patient required bronchoscopy and was reintubated last evening.  His ileostomy is working well.  Midline incision looks good, but we will continue the wound VAC at this time.  Objective: Vital signs in last 24 hours: Temp:  [97.5 F (36.4 C)-100.4 F (38 C)] 98.8 F (37.1 C) (10/09 0800) Pulse Rate:  [54-125] 106 (10/09 0800) Resp:  [22-48] 30 (10/09 0800) BP: (100-139)/(53-101) 106/64 (10/09 0800) SpO2:  [93 %-100 %] 100 % (10/09 0800) FiO2 (%):  [50 %-60 %] 50 % (10/09 0800) Weight:  [70.8 kg] 70.8 kg (10/09 0416) Last BM Date: 04/05/19 825 tube feeding  154 IV 1327 urine Drained 10 T-max 100.4.  Tachycardic, blood pressure was stable. Back on the ventilator with FiO2 60%  he did require some Neo-Synephrine yesterday for a short time. NA 151, potassium 3.4, chloride 122, CO2 21, glucose 178 Creatinine 1.21 WBC 33.9 H/H  - 9.8/29 Platelets 226,000 Chest x-ray yesterday at 8:55 PM: Endotracheal tube about 2.5 cm superior to the carina, no change in extensive bilateral interstitial and groundglass space disease; probably small effusions. Intake/Output from previous day: 10/08 0701 - 10/09 0700 In: 1769 [I.V.:154.1; NG/GT:825; IV Piggyback:789.9] Out: 2387 [Urine:1327; Drains:10; Stool:1050] Intake/Output this shift: Total I/O In: 126.9 [I.V.:16.9; NG/GT:110] Out: 130 [Urine:130]  General appearance: Sedated on the ventilator. Resp: Clear anterior exam. GI: Midline incision looks good.  Ostomy is working well, and looks good.  Tolerating tube feedings.  Lab Results:  Recent Labs    04/04/19 0349  04/04/19 2214 04/05/19 0422  WBC 32.6*  --   --  33.9*  HGB 10.6*   < > 10.2* 9.8*  HCT 30.7*   < > 30.0* 29.0*  PLT 220  --   --  226   < > = values in this interval not displayed.    BMET Recent Labs    04/04/19 2141 04/04/19 2214 04/05/19 0422  NA 150* 156* 151*  K 2.3* 2.4* 3.4*  CL 123*  --  122*  CO2  21*  --  21*  GLUCOSE 195*  --  178*  BUN 32*  --  34*  CREATININE 1.13  --  1.21  CALCIUM 8.1*  --  8.3*   PT/INR Recent Labs    04/03/19 0800 04/04/19 0349  LABPROT 20.6* 19.8*  INR 1.8* 1.7*    Recent Labs  Lab 04/01/19 0454  AST 37  ALT 33  ALKPHOS 62  BILITOT 2.0*  PROT 4.6*  ALBUMIN 1.5*     Lipase  No results found for: LIPASE   Medications: . chlorhexidine gluconate (MEDLINE KIT)  15 mL Mouth Rinse BID  . Chlorhexidine Gluconate Cloth  6 each Topical Daily  . enoxaparin (LOVENOX) injection  40 mg Subcutaneous Daily  . fentaNYL (SUBLIMAZE) injection  25 mcg Intravenous Once  . free water  200 mL Per Tube Q4H  . gabapentin  100 mg Per Tube Q8H  . insulin aspart  0-15 Units Subcutaneous Q4H  . lip balm  1 application Topical BID  . mouth rinse  15 mL Mouth Rinse 10 times per day  . pantoprazole sodium  40 mg Per Tube BID   . sodium chloride Stopped (04/01/19 0728)  . albumin human    . feeding supplement (PIVOT 1.5 CAL) 55 mL/hr at 04/05/19 0800  . fentaNYL infusion INTRAVENOUS 50 mcg/hr (04/05/19 0800)    Assessment/Plan Syncope Shock/sepsis Acute hypoxic respiratory failure  - extubated 04/04/19 -  bronchoscopy and reintubated 04/04/2019 Lung mass left upper lobe Aspiration pneumonia Hx right bundle branch block Chronic orthostatic hypotension Failure to thrive Acute metabolic encephalopathy Hypokalemia, hypomagnesemia, hypophosphatemia, hypernatremia Severe protein calorie malnutrition -on tube feeds 55 ml/hr Leukocytosis 12.4(10/1)>> 23.2(10/5)>> 28.4(10/6)>>32.6(10/8) Anemia transfused x 4 PRBC  Bulky transverse colon mass, jejunal ischemia due to adhesions to transverse colon and omentum causing closed-loop volvulus. S/p exploratory laparotomy, extended right colectomy with end ileostomy/Hartman, jejunal small bowel resection with anastomosis, wedge biopsy of liver mass, application of wound VAC over closed fascia, 03/31/2019,  Dr. Michael Boston  POD #7  FEN: Tube feeding/IV fluids/n.p.o./ - currently off VTE: Lovenox [id number] pre op; Flagyl 10/1-10/5; cefepime 10/2-10/7;   Plan: Continue critical care by CCM.  He has an NG in place after being reintubated.  We will hold off on the core track for now, and continue tube feeds through the NG.  Should he require decompression we can use the current NG tube.  Continue daily wound VAC for now.  Site looks very good.  He is tolerating tube feedings well and ileostomy is working very well.      LOS: 8 days    Deaundra Dupriest 04/05/2019 [phone]

## 2019-04-05 NOTE — Progress Notes (Signed)
NAME:  Darryl Johns, MRN:  [id number], DOB:  09/11/1934, LOS: 28 ADMISSION DATE:  04/23/2019, CONSULTATION DATE:   REFERRING MD: Velna Ochs MD, CHIEF COMPLAINT:  GI bleed  Brief History   84 yo male former smoker presented to ER with altered mental status after falling.  Found to have hematemesis and melena with hemorrhagic shock.  CT imaging showed transverse colon mass and SB volvulus.  Required ventilatory support and pressors.  Past Medical History  RBBB, Sensorimotor axonal peripheral neuropathy, HTN, allergies, colon mass, Sjogren's syndrome, Emphysema  Significant Hospital Events   10/02 laparotomy with Rt colectomy and end ileostomy, jejunal SB resection with anastomosis, wedge bx of liver mass, application of wound vac over closed fascia  10/06 off pressors  Consults:  GI  Procedures:  ETT >>10/02 Extubate >>10/08 ETT >> 10/08 Rt [place] CVL 10/02 >>   Significant Diagnostic Tests:  CTA 10/1 >> no PE, right lower lobe consolidation.  1.4 cm left upper lobe nodule.  Emphysema, aortic, coronary atherosclerosis. CT head / neck 10/1 >> chronic ischemic changes.  No acute abnormalities.  Micro Data:  COVID 10/1 >> negative  Tracheal aspirate 10/4 >> Predom PMN, Rare yeast BCx2 10/1 >> negative   Antimicrobials:  Vanc 10/1 >> 10/2 Cefepime 10/1 >> 10/7 Flagyl 10/1 >> 10/5  Interim history/subjective:  Intubated for tachypnea and secretions, Low K+, electrolyte replaced, Responsive head nodding to questions  Objective   Blood pressure 106/64, pulse (!) 106, temperature 98.8 F (37.1 C), temperature source Oral, resp. rate (!) 30, height 6\' 2"  (1.88 m), weight 70.8 kg, SpO2 100 %.    Vent Mode: PRVC FiO2 (%):  [50 %-60 %] 50 % Set Rate:  [14 bmp-16 bmp] 14 bmp Vt Set:  [490 mL-570 mL] 570 mL PEEP:  [8 cmH20-10 cmH20] 10 cmH20 Plateau Pressure:  [20 cmH20-30 cmH20] 30 cmH20   Intake/Output Summary (Last 24 hours) at 04/05/2019 0841 Last data filed at 04/05/2019  0800 Gross per 24 hour  Intake 1675.72 ml  Output 1967 ml  Net -291.28 ml   Filed Weights   04/03/19 0332 04/04/19 0350 04/05/19 0416  Weight: 71.8 kg 71.7 kg 70.8 kg    Examination:.  General - Awake, intubated HEENT: pupils reactive,  ETT in place Cardiac - tachycardic, regular Rhythm,  no murmurs, rubs, or gallops Chest - CTAB Abdomen - wound vac, ileostomy in place, hyperactive bowel sounds Extremities - 2+ edema to the knee Skin - no rashes Neuro - RASS -1   Resolved Hospital Problem list   Lactic Acidosis, Hemorrhagic shock, Acute GI bleed, Elevated ammonia in setting of GI bleed  Assessment & Plan:    Acute hypoxic respiratory failure. Emphysema. Interstitial edema. - vent support, wean as tolerated - prn BDs  Lt upper lobe nodule. - will need assessment as outpt  Colon mass with liver mass, SB volvulus, GI bleed s/p laparotomy. Severe protein calorie malnutrition. - post op care, nutrition per CCS - f/u Hb and transfuse for count less than 7 or significant bleeding - protonix bid - add lovenox for DVT prophylaxis when okay with surgery  Aspiration pneumonia. - completed 7 day course of cefepime  Hypokalemia, hypomagnesemia, hypophosphatemia. K 3.4 - Replace with IV K - Repeat BMET  Hypernatremia Na 151, secondary to fluid removal wit lasix.  - Increase free water  - Repeat BMET  Acute metabolic encephalopathy. Recent w/u for peripheral neuropathy with concern for Sjogren's syndrome. - RASS goal 0 to -1  Sinus tachycardia. -  prn lopressor 2.5 mg for HR > 115  Best practice:  Diet: tube feeds DVT prophylaxis: SCDs GI prophylaxis: protonix Mobility: Bed Code Status: Full Code   Disposition: ICU  Labs    CMP Latest Ref Rng & Units 04/05/2019 04/04/2019 04/04/2019  Glucose 70 - 99 mg/dL 178(H) - 195(H)  BUN 8 - 23 mg/dL 34(H) - 32(H)  Creatinine 0.61 - 1.24 mg/dL 1.21 - 1.13  Sodium 135 - 145 mmol/L 151(H) 156(H) 150(H)  Potassium 3.5 - 5.1  mmol/L 3.4(L) 2.4(LL) 2.3(LL)  Chloride 98 - 111 mmol/L 122(H) - 123(H)  CO2 22 - 32 mmol/L 21(L) - 21(L)  Calcium 8.9 - 10.3 mg/dL 8.3(L) - 8.1(L)  Total Protein 6.5 - 8.1 g/dL - - -  Total Bilirubin 0.3 - 1.2 mg/dL - - -  Alkaline Phos 38 - 126 U/L - - -  AST 15 - 41 U/L - - -  ALT 0 - 44 U/L - - -   CBC Latest Ref Rng & Units 04/05/2019 04/04/2019 04/04/2019  WBC 4.0 - 10.5 K/uL 33.9(H) - -  Hemoglobin 13.0 - 17.0 g/dL 9.8(L) 10.2(L) 10.9(L)  Hematocrit 39.0 - 52.0 % 29.0(L) 30.0(L) 32.0(L)  Platelets 150 - 400 K/uL 226 - -   ABG    Component Value Date/Time   PHART 7.391 04/04/2019 2214   PCO2ART 33.3 04/04/2019 2214   PO2ART 66.0 (L) 04/04/2019 2214   HCO3 20.0 04/04/2019 2214   TCO2 21 (L) 04/04/2019 2214   ACIDBASEDEF 4.0 (H) 04/04/2019 2214   O2SAT 92.0 04/04/2019 2214   CBG (last 3)  Recent Labs    04/04/19 2346 04/05/19 0327 04/05/19 0722  GLUCAP [place]    Renee Rival, MS4

## 2019-04-05 NOTE — Plan of Care (Signed)
  Problem: Respiratory: Goal: Ability to maintain a clear airway and adequate ventilation will improve Outcome: Progressing   Problem: Bowel/Gastric: Goal: Will show no signs and symptoms of gastrointestinal bleeding Outcome: Progressing   Problem: Fluid Volume: Goal: Will show no signs and symptoms of excessive bleeding Outcome: Progressing   Problem: Nutrition: Goal: Adequate nutrition will be maintained Outcome: Progressing   Problem: Elimination: Goal: Will not experience complications related to bowel motility Outcome: Progressing Goal: Will not experience complications related to urinary retention Outcome: Progressing   Problem: Pain Managment: Goal: General experience of comfort will improve Outcome: Progressing   Problem: Safety: Goal: Ability to remain free from injury will improve Outcome: Progressing

## 2019-04-05 NOTE — Evaluation (Signed)
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 Admitted 04/19/2019 for treatment of abdominal transverse colon mass, 03/28/2019- ex lap with hemicolectomy and diverting colostomy, Intubated 10/2-10/8 Reintubated due to respiratory insufficiency for airway protection and managment of secretions.  Clinical Impression  Despite pt intubation he indicates willingness to work with therapy. Pt's bed was placed in chair position and therapists performed limited PROM and strength testing of extremities. Evaluation limited due to increase in HR and RR with movement. After assessment and pt's vital returned to baseline levels, pt asked whether he was agreeable to leaving bed in chair position. Utilized pillows to maximize positioning. PT will continue to follow for more thorough evaluation as pt conditions improve. PT currently recommending SNF level rehab but disposition based on limited evaluation.     Follow Up Recommendations SNF    Equipment Recommendations  Other (comment)(TBD at next venue)       Precautions / Restrictions Precautions Precautions: Fall Precaution Comments: intubated, colostomy, NG tube Restrictions Weight Bearing Restrictions: No          Pertinent Vitals/Pain Pain Assessment: Faces Faces Pain Scale: Hurts little more Pain Location: genralized with movement Pain Descriptors / Indicators: Grimacing Pain Intervention(s): Limited activity within patient's tolerance;Monitored during session;Repositioned    Home Living Family/patient expects to be discharged to:: Skilled nursing facility                      Prior Function           Comments: unsure, no family present and pt unable to converse  due to intubation      Hand Dominance   Dominant Hand: Right    Extremity/Trunk Assessment   Upper Extremity Assessment Upper Extremity Assessment: Defer to OT evaluation    Lower Extremity Assessment Lower Extremity Assessment: RLE deficits/detail;LLE deficits/detail RLE Deficits / Details: PROM stiff but WNL, strength grossly assessed 2/5 RLE Coordination: decreased fine motor;decreased gross motor LLE Deficits / Details: PROM stiff but WNL, strength grossly assessed 2/5 LLE Coordination: decreased fine motor;decreased gross motor       Communication   Communication: Expressive difficulties;Other (comment)(intubated)  Cognition Arousal/Alertness: Awake/alert Behavior During Therapy: WFL for tasks assessed/performed Overall Cognitive Status: Difficult to assess                                 General Comments: pt intubated, able to answer general yes/no questions      General Comments General comments (skin integrity, edema, etc.): Pt intubated on FiO2 50%, PEEP 10, RR max with movement 42 bpm, max HR 136 bpm, limited assessment due to increased heart and respiratory rates        Assessment/Plan    PT Assessment Patient needs continued PT services  PT Problem List Decreased strength;Decreased range of motion;Decreased activity tolerance;Decreased balance;Decreased mobility;Decreased coordination;Cardiopulmonary status limiting activity       PT Treatment Interventions DME instruction;Gait training;Functional mobility training;Therapeutic activities;Therapeutic exercise;Balance training;Cognitive remediation;Patient/family education    PT Goals (Current goals can be found in the Care Plan section)  Acute Rehab PT Goals Patient Stated Goal: none stated PT Goal Formulation: Patient unable to participate in goal setting Time For Goal Achievement: 04/19/19 Potential to Achieve Goals: Fair    Frequency  Min 2X/week        Co-evaluation PT/OT/SLP  Co-Evaluation/Treatment: Yes Reason for Co-Treatment: Complexity of the patient's impairments (multi-system involvement)           AM-PAC PT "6 Clicks" Mobility  Outcome Measure Help needed turning from your back to your side while in a flat bed without using bedrails?: Total Help needed moving from lying on your back to sitting on the side of a flat bed without using bedrails?: Total Help needed moving to and from a bed to a chair (including a wheelchair)?: Total Help needed standing up from a chair using your arms (e.g., wheelchair or bedside chair)?: Total Help needed to walk in hospital room?: Total Help needed climbing 3-5 steps with a railing? : Total 6 Click Score: 6    End of Session Equipment Utilized During Treatment: Oxygen Activity Tolerance: Treatment limited secondary to medical complications (Comment)(increased HR/RR) Patient left: in bed;with call bell/phone within reach;with bed alarm set;Other (comment)(pt left in chair position) Nurse Communication: Mobility status PT Visit Diagnosis: Unsteadiness on feet (R26.81);Other abnormalities of gait and mobility (R26.89);Muscle weakness (generalized) (M62.81);Difficulty in walking, not elsewhere classified (R26.2)    Time: [id number] PT Time Calculation (min) (ACUTE ONLY): 12 min   Charges:   PT Evaluation $PT Eval Moderate Complexity: 1 Mod          Kenise Barraco B. Migdalia Dk PT, DPT Acute Rehabilitation Services Pager [phone] Office [phone]   [place] 04/05/2019, 1:58 PM

## 2019-04-05 NOTE — Evaluation (Signed)
Occupational Therapy Evaluation Patient Details Name: Darryl Johns MRN: [id number] DOB: 07-Apr-1935 Today's Date: 04/05/2019    History of Present Illness Pt is a 84 yo male s/p AMS and fall; hematemesis and melena. CT imaging showed transverse colon mass and SB volvulus.  Required ventilatory support and pressors. 10/2 laparotomy and 10/6 off pressors. PMHx: hypokalemia, anemia, PMA, lung mass.   Clinical Impression   Pt PTA: from SNF. Pt following commands, but continues to be intubated. Pt tolerating AAROM shoulder through digits with minA overall to facilitate movements x5 times each. Pt currently limited by poor mobility, decreased strength and overall deconditioned status. Pt moving UB and LB; placed in chair position to increase sitting tolerance. Pt intubated on FiO2 50%, PEEP 10, RR max with movement 42 bpm, max HR 136 bpm, limited assessment due to increased heart and respiratory rates. Pt maxA to [place] for ADL tasks. Swelling noted in BUEs- elevated with pillows. RN aware. Pt would benefit from continued OT skilled services for ADL, mobility and safety in SNF setting. OT following acutely.     Follow Up Recommendations  SNF    Equipment Recommendations  None recommended by OT    Recommendations for Other Services       Precautions / Restrictions Precautions Precautions: Fall Precaution Comments: intubated, colostomy, NG tube Restrictions Weight Bearing Restrictions: No      Mobility Bed Mobility Overal bed mobility: Needs Assistance             General bed mobility comments: Pt moving UB and LB; placed in chair position to increase sitting tolerance.  Transfers                 General transfer comment: deferred- maxisky for staff    Balance                                           ADL either performed or assessed with clinical judgement   ADL Overall ADL's : Needs assistance/impaired Eating/Feeding: NPO   Grooming: Maximal  assistance;Total assistance   Upper Body Bathing: Maximal assistance;Total assistance;Bed level   Lower Body Bathing: Total assistance   Upper Body Dressing : Maximal assistance;Total assistance;Bed level   Lower Body Dressing: Total assistance;Bed level   Toilet Transfer: Total assistance   Toileting- Clothing Manipulation and Hygiene: Maximal assistance;Total assistance;Bed level       Functional mobility during ADLs: Maximal assistance;Total assistance General ADL Comments: Pt limited by poor mobility, decreased strength and overall deconditioned status.     Vision Baseline Vision/History: No visual deficits Vision Assessment?: No apparent visual deficits     Perception     Praxis      Pertinent Vitals/Pain Pain Assessment: Faces Faces Pain Scale: Hurts little more Pain Location: genralized with movement Pain Descriptors / Indicators: Grimacing Pain Intervention(s): Limited activity within patient's tolerance     Hand Dominance Right   Extremity/Trunk Assessment Upper Extremity Assessment Upper Extremity Assessment: Generalized weakness;RUE deficits/detail;LUE deficits/detail RUE Deficits / Details: AAROM to 90* FF and edema noted RUE Coordination: decreased fine motor;decreased gross motor LUE Deficits / Details: AAROM to 90* FF and edema noted LUE Coordination: decreased fine motor;decreased gross motor   Lower Extremity Assessment Lower Extremity Assessment: Defer to PT evaluation;Generalized weakness RLE Deficits / Details: PROM stiff but WNL, strength grossly assessed 2/5 RLE Coordination: decreased fine motor;decreased gross motor LLE Deficits / Details: PROM stiff  but WNL, strength grossly assessed 2/5 LLE Coordination: decreased fine motor;decreased gross motor       Communication Communication Communication: Expressive difficulties;Other (comment)(intubated)   Cognition Arousal/Alertness: Awake/alert Behavior During Therapy: WFL for tasks  assessed/performed Overall Cognitive Status: Difficult to assess                                 General Comments: pt intubated, able to answer general yes/no questions   General Comments  Pt intubated on FiO2 50%, PEEP 10, RR max with movement 42 bpm, max HR 136 bpm, limited assessment due to increased heart and respiratory rates    Exercises Exercises: Other exercises Other Exercises Other Exercises: Shoulder through digits AAROM light exercise; flexion, arm circles, elbow flex/ext and hand squeezes   Shoulder Instructions      Home Living Family/patient expects to be discharged to:: Skilled nursing facility                                        Prior Functioning/Environment          Comments: unsure, no family present and pt unable to converse due to intubation         OT Problem List: Decreased strength;Decreased activity tolerance;Impaired balance (sitting and/or standing);Decreased safety awareness;Pain;Impaired UE functional use      OT Treatment/Interventions: Self-care/ADL training;Therapeutic exercise;Neuromuscular education;Energy conservation;Therapeutic activities;Patient/family education;Balance training    OT Goals(Current goals can be found in the care plan section) Acute Rehab OT Goals Patient Stated Goal: none stated OT Goal Formulation: With patient Time For Goal Achievement: 04/19/19 Potential to Achieve Goals: Good ADL Goals Pt Will Perform Eating: with set-up;bed level Pt Will Perform Grooming: with set-up;sitting Pt/caregiver will Perform Home Exercise Program: Increased strength;Both right and left upper extremity Additional ADL Goal #1: Pt will increase to x5 mins of ADL tasks sitting EOB with minA overall.  OT Frequency: Min 2X/week   Barriers to D/C:            Co-evaluation PT/OT/SLP Co-Evaluation/Treatment: Yes Reason for Co-Treatment: Complexity of the patient's impairments (multi-system  involvement);For patient/therapist safety   OT goals addressed during session: ADL's and self-care      AM-PAC OT "6 Clicks" Daily Activity     Outcome Measure Help from another person eating meals?: Total Help from another person taking care of personal grooming?: Total Help from another person toileting, which includes using toliet, bedpan, or urinal?: Total Help from another person bathing (including washing, rinsing, drying)?: Total Help from another person to put on and taking off regular upper body clothing?: Total Help from another person to put on and taking off regular lower body clothing?: Total 6 Click Score: 6   End of Session Equipment Utilized During Treatment: Oxygen Nurse Communication: Mobility status  Activity Tolerance: Patient limited by fatigue;Treatment limited secondary to medical complications (Comment) Patient left: in bed;with call bell/phone within reach;with bed alarm set  OT Visit Diagnosis: Muscle weakness (generalized) (M62.81)                Time: [id number] OT Time Calculation (min): 14 min Charges:  OT General Charges $OT Visit: 1 Visit OT Evaluation $OT Eval Low Complexity: 1 Low  Darryl Nestle) Marsa Aris OTR/L Acute Rehabilitation Services Pager: [phone] Office: [phone]   Jenene Slicker Janace Decker 04/05/2019, 3:59 PM

## 2019-04-06 ENCOUNTER — Inpatient Hospital Stay (HOSPITAL_COMMUNITY): Payer: Medicare HMO

## 2019-04-06 DIAGNOSIS — L8990 Pressure ulcer of unspecified site, unspecified stage: Secondary | ICD-10-CM | POA: Insufficient documentation

## 2019-04-06 LAB — BASIC METABOLIC PANEL
Anion gap: 5 (ref 5–15)
Anion gap: 4 — ABNORMAL LOW (ref 5–15)
BUN: 36 mg/dL — ABNORMAL HIGH (ref 8–23)
BUN: 37 mg/dL — ABNORMAL HIGH (ref 8–23)
CO2: 21 mmol/L — ABNORMAL LOW (ref 22–32)
CO2: 21 mmol/L — ABNORMAL LOW (ref 22–32)
Calcium: 8.2 mg/dL — ABNORMAL LOW (ref 8.9–10.3)
Calcium: 8.2 mg/dL — ABNORMAL LOW (ref 8.9–10.3)
Chloride: 125 mmol/L — ABNORMAL HIGH (ref 98–111)
Chloride: 126 mmol/L — ABNORMAL HIGH (ref 98–111)
Creatinine, Ser: 1.02 mg/dL (ref 0.61–1.24)
Creatinine, Ser: 0.94 mg/dL (ref 0.61–1.24)
GFR calc Af Amer: >60 mL/min (ref >60)
GFR calc Af Amer: >60 mL/min (ref >60)
GFR calc non Af Amer: >60 mL/min (ref >60)
GFR calc non Af Amer: >60 mL/min (ref >60)
Glucose, Bld: 181 mg/dL — ABNORMAL HIGH (ref 70–99)
Glucose, Bld: 141 mg/dL — ABNORMAL HIGH (ref 70–99)
Potassium: 4 mmol/L (ref 3.5–5.1)
Potassium: 3.5 mmol/L (ref 3.5–5.1)
Sodium: 151 mmol/L — ABNORMAL HIGH (ref 135–145)
Sodium: 151 mmol/L — ABNORMAL HIGH (ref 135–145)

## 2019-04-06 LAB — CBC
HCT: 28.4 % — ABNORMAL LOW (ref 39.0–52.0)
Hemoglobin: 9.7 g/dL — ABNORMAL LOW (ref 13.0–17.0)
MCH: 28.1 pg (ref 26.0–34.0)
MCHC: 34.2 g/dL (ref 30.0–36.0)
MCV: 82.3 fL (ref 80.0–100.0)
Platelets: 268 10*3/uL (ref 150–400)
RBC: 3.45 MIL/uL — ABNORMAL LOW (ref 4.22–5.81)
RDW: 21.8 % — ABNORMAL HIGH (ref 11.5–15.5)
WBC: 37.4 10*3/uL — ABNORMAL HIGH (ref 4.0–10.5)
nRBC: 0.1 % (ref 0.0–0.2)

## 2019-04-06 LAB — MAGNESIUM
Magnesium: 1.9 mg/dL (ref 1.7–2.4)
Magnesium: 1.7 mg/dL (ref 1.7–2.4)

## 2019-04-06 LAB — GLUCOSE, CAPILLARY
Glucose-Capillary: 125 mg/dL — ABNORMAL HIGH (ref 70–99)
Glucose-Capillary: 151 mg/dL — ABNORMAL HIGH (ref 70–99)
Glucose-Capillary: 146 mg/dL — ABNORMAL HIGH (ref 70–99)
Glucose-Capillary: 130 mg/dL — ABNORMAL HIGH (ref 70–99)
Glucose-Capillary: 114 mg/dL — ABNORMAL HIGH (ref 70–99)

## 2019-04-06 LAB — CULTURE, BAL-QUANTITATIVE W GRAM STAIN
Culture: 60000 — AB
Gram Stain: NONE SEEN

## 2019-04-06 MED ORDER — ACETAMINOPHEN 160 MG/5ML PO SOLN
650.0000 mg | Freq: Four times a day (QID) | ORAL | Status: DC | PRN
  Administered 2019-04-06: 650 mg
  Filled 2019-04-06: qty 20.3

## 2019-04-06 MED ORDER — MAGNESIUM SULFATE 2 GM/50ML IV SOLN
2.0000 g | Freq: Once | INTRAVENOUS | Status: AC
  Administered 2019-04-06: 2 g via INTRAVENOUS
  Filled 2019-04-06: qty 50

## 2019-04-06 MED ORDER — POTASSIUM CHLORIDE 10 MEQ/50ML IV SOLN
10.0000 meq | INTRAVENOUS | Status: AC
  Administered 2019-04-06 – 2019-04-07 (×4): 10 meq via INTRAVENOUS
  Filled 2019-04-06 (×4): qty 50

## 2019-04-06 NOTE — Progress Notes (Addendum)
eLink Physician-Brief Progress Note Patient Name: Darryl Johns DOB: 1934-12-08 MRN: [id number]   Date of Service  04/06/2019  HPI/Events of Note  Notified of a run of VT. Camera assessment, awake alert with stable vitals and sinus tach. RN notes this happened twice earlier today as well with no hemodynamic changes  eICU Interventions  Check BMP and mag Asked RN to let us know so we can keep K > 4 and mag > 2         [name] G Rhona Fusilier 04/06/2019, 8:43 PM   10.10 pm K and mag noted Repletion ordered  Check mag again in AM

## 2019-04-06 NOTE — Progress Notes (Signed)
K 3.4, Elink notified, order for 6 runs of IV potassium

## 2019-04-06 NOTE — Progress Notes (Signed)
Patient 19 beat run of SVT, Elink notified, awaiting orders. Will continue to monitor.

## 2019-04-06 NOTE — Progress Notes (Addendum)
8 Days Post-Op      Subjective: Sedated on the ventilator.  Abdomen soft nondistended.  Wound VAC in place ileostomy working well, tube feedings per NG  Objective: Vital signs in last 24 hours: Temp:  [97.2 F (36.2 C)-100.7 F (38.2 C)] 98.9 F (37.2 C) (10/10 0746) Pulse Rate:  [106-123] 108 (10/10 0800) Resp:  [20-36] 26 (10/10 0800) BP: (107-131)/(53-84) 109/56 (10/10 0800) SpO2:  [98 %-100 %] 100 % (10/10 0800) FiO2 (%):  [40 %] 40 % (10/10 0800) Weight:  [70.8 kg] 70.8 kg (10/10 0407) Last BM Date: 04/05/19  900 IV 1515 Tube feeding 1245 urine 575 ileostomy Tm 100.7 Tachycardic BP OK FiO2 40% on Vent Na 152 K+ 4.0 Creatinine 1.02 WBC rising 12.4(10/1)>>23.2(10/4)>>28.7(10/7)>>33.9(10/9)>>37.4(10/10) CXR pending Intake/Output from previous day: 10/09 0701 - 10/10 0700 In: 2403.1 [I.V.:388; NG/GT:1515; IV Piggyback:500.1] Out: 1820 [Urine:1245; Stool:575] Intake/Output this shift: Total I/O In: 75.5 [I.V.:20.5; NG/GT:55] Out: 145 [Urine:145]  General appearance: Sedated on the ventilator. GI: Soft, not distended.  Midline incision with a wound VAC in place.  I looked at yesterday for the wound VAC change and it looked good.  Ileostomy is working well.  Lab Results:  Recent Labs    04/05/19 0422 04/06/19 0448  WBC 33.9* 37.4*  HGB 9.8* 9.7*  HCT 29.0* 28.4*  PLT 226 268    BMET Recent Labs    04/05/19 2000 04/06/19 0448  NA 149* 151*  K 3.4* 4.0  CL 123* 125*  CO2 20* 21*  GLUCOSE 157* 181*  BUN 35* 36*  CREATININE 1.07 1.02  CALCIUM 8.1* 8.2*   PT/INR Recent Labs    04/04/19 0349  LABPROT 19.8*  INR 1.7*    Recent Labs  Lab 04/01/19 0454  AST 37  ALT 33  ALKPHOS 62  BILITOT 2.0*  PROT 4.6*  ALBUMIN 1.5*     Lipase  No results found for: LIPASE   Medications: . chlorhexidine gluconate (MEDLINE KIT)  15 mL Mouth Rinse BID  . Chlorhexidine Gluconate Cloth  6 each Topical Daily  . enoxaparin (LOVENOX) injection  40 mg  Subcutaneous Daily  . fentaNYL (SUBLIMAZE) injection  25 mcg Intravenous Once  . free water  200 mL Per Tube Q4H  . gabapentin  100 mg Per Tube Q8H  . insulin aspart  0-15 Units Subcutaneous Q4H  . lip balm  1 application Topical BID  . mouth rinse  15 mL Mouth Rinse 10 times per day  . pantoprazole sodium  40 mg Per Tube BID    Assessment/Plan Syncope Shock/sepsis Acute hypoxic respiratory failure - extubated 04/04/19 -bronchoscopy and reintubated 04/04/2019 Lung mass left upper lobe Aspiration pneumonia Hx right bundle branch block Chronic orthostatic hypotension Failure to thrive Malnutrition -prealbumin pending Acute metabolic encephalopathy Hypokalemia, hypomagnesemia, hypophosphatemia, hypernatremia Severe protein calorie malnutrition -on tube feeds 55 ml/hr Leukocytosis12.4(10/1)>>23.2(10/5)>>28.4(10/6)>>32.6(10/8) Anemia transfused x 4 PRBC  Bulky transverse colon mass, jejunal ischemia due to adhesions to transverse colon and omentum causing closed-loop volvulus. S/p exploratory laparotomy, extended right colectomy with end ileostomy/Hartman, jejunal small bowel resection with anastomosis, wedge biopsy of liver mass, application of wound VAC over closed fascia, 04/18/2019, Dr. Michael Boston POD #8  - leukocytosis 12.4(10/1)>>23.2(10/4)>>28.7(10/7)>>33.9(10/9)>>37.4(10/10)  FEN: Tube feeding/IV fluids/n.p.o./ - currently off VTE: Lovenox YF:[id number] pre op;Flagyl 10/1-10/5; cefepime 10/2-10/7;  Plan: Continued medical management per CCM.  Abdominal exam is unremarkable.  Discussed briefly with Dr. Halford Chessman, [place].       LOS: 9 days    Secret Kristensen 04/06/2019 [phone]

## 2019-04-06 NOTE — Progress Notes (Signed)
NAME:  Darryl Johns, MRN:  [id number], DOB:  [birth year], LOS: 1 ADMISSION DATE:  04/26/2019, CONSULTATION DATE:   REFERRING MD: Velna Ochs MD, CHIEF COMPLAINT:  GI bleed  Brief History   84 yo male former smoker presented to ER with altered mental status after falling.  Found to have hematemesis and melena with hemorrhagic shock.  CT imaging showed transverse colon mass and SB volvulus.  Required ventilatory support and pressors.  Past Medical History  RBBB, Sensorimotor axonal peripheral neuropathy, HTN, allergies, colon mass, Sjogren's syndrome, Emphysema  Significant Hospital Events   10/02 laparotomy with Rt colectomy and end ileostomy, jejunal SB resection with anastomosis, wedge bx of liver mass, application of wound vac over closed fascia  10/06 off pressors 10/08 extubated >> reintubated with concern for airway protection  Consults:  GI  Procedures:  ETT 10/2 >> 10/8 Rt [place] CVL 10/2 >> ETT 10/8 >>  Significant Diagnostic Tests:  CTA 10/1 >> no PE, right lower lobe consolidation.  1.4 cm left upper lobe nodule.  Emphysema, aortic, coronary atherosclerosis. CT head / neck 10/1 >> chronic ischemic changes.  No acute abnormalities.  Micro Data:  COVID 10/1 >> negative  Tracheal aspirate 10/4 >> Predom PMN, Rare yeast BCx2 10/1 >> negative BAL 10/8 >>   Antimicrobials:  Cefepime 10/1 >> 10/7 Cefepime 10/9 >>   Interim history/subjective:  Remains on vent.  Objective   Blood pressure (!) 102/54, pulse (!) 106, temperature 98.9 F (37.2 C), temperature source Oral, resp. rate (!) 25, height _0  (1.88 m), weight 70.8 kg, SpO2 100 %.    Vent Mode: PSV;CPAP FiO2 (%):  [40 %] 40 % Set Rate:  [14 bmp] 14 bmp Vt Set:  [570 mL] 570 mL PEEP:  [5 cmH20-10 cmH20] 5 cmH20 Pressure Support:  [5 cmH20] 5 cmH20 Plateau Pressure:  [5 cmH20-16 cmH20] 11 cmH20   Intake/Output Summary (Last 24 hours) at 04/06/2019 0848 Last data filed at 04/06/2019 0800 Gross per 24  hour  Intake 2371.72 ml  Output 1835 ml  Net 536.72 ml   Filed Weights   04/04/19 0350 04/05/19 0416 04/06/19 0407  Weight: 71.7 kg 70.8 kg 70.8 kg    Examination:.  General - cachectic Eyes - pupils reactive ENT - ETT in place Cardiac - regular rate/rhythm, no murmur Chest - Lt > Rt crackles Abdomen - wound vac in place Extremities - 1+ edema Skin - no rashes Neuro - RASS -2   Resolved Hospital Problem list   Lactic Acidosis, Hemorrhagic shock, Acute GI bleed, Elevated ammonia in setting of GI bleed  Assessment & Plan:    Acute hypoxic respiratory failure. Emphysema. Interstitial edema. - continue vent support - f/u CXR intermittently - prn BDs  HCAP. - continue ABx  Lt upper lobe nodule. - will need assessment as outpt  Colon cancer, SB volvulus, GI bleed s/p laparotomy. Severe protein calorie malnutrition. - post op care, nutrition per surgery  Hypernatremia. - continue free water - f/u BMET  Acute metabolic encephalopathy. Recent w/u for peripheral neuropathy with concern for Sjogren's syndrome. - RASS goal 0 to -1 - continue neurontin   Best practice:  Diet: tube feeds DVT prophylaxis: SCDs GI prophylaxis: protonix Mobility: Bed Code Status: Full Code   Disposition: ICU  Labs    CMP Latest Ref Rng & Units 04/06/2019 04/05/2019 04/05/2019  Glucose 70 - 99 mg/dL 181(H) 157(H) 178(H)  BUN 8 - 23 mg/dL 36(H) 35(H) 34(H)  Creatinine 0.61 - 1.24 mg/dL 1.02 1.07  1.21  Sodium 135 - 145 mmol/L 151(H) 149(H) 151(H)  Potassium 3.5 - 5.1 mmol/L 4.0 3.4(L) 3.4(L)  Chloride 98 - 111 mmol/L 125(H) 123(H) 122(H)  CO2 22 - 32 mmol/L 21(L) 20(L) 21(L)  Calcium 8.9 - 10.3 mg/dL 8.2(L) 8.1(L) 8.3(L)  Total Protein 6.5 - 8.1 g/dL - - -  Total Bilirubin 0.3 - 1.2 mg/dL - - -  Alkaline Phos 38 - 126 U/L - - -  AST 15 - 41 U/L - - -  ALT 0 - 44 U/L - - -   CBC Latest Ref Rng & Units 04/06/2019 04/05/2019 04/04/2019  WBC 4.0 - 10.5 K/uL 37.4(H) 33.9(H) -   Hemoglobin 13.0 - 17.0 g/dL 9.7(L) 9.8(L) 10.2(L)  Hematocrit 39.0 - 52.0 % 28.4(L) 29.0(L) 30.0(L)  Platelets 150 - 400 K/uL 268 226 -   ABG    Component Value Date/Time   PHART 7.391 04/04/2019 2214   PCO2ART 33.3 04/04/2019 2214   PO2ART 66.0 (L) 04/04/2019 2214   HCO3 20.0 04/04/2019 2214   TCO2 21 (L) 04/04/2019 2214   ACIDBASEDEF 4.0 (H) 04/04/2019 2214   O2SAT 92.0 04/04/2019 2214   CBG (last 3)  Recent Labs    04/05/19 2329 04/06/19 0408 04/06/19 0739  GLUCAP 129* 125* 151*    CC time 33 minutes  Chesley Mires, MD [facility] Pulmonary/Critical Care 04/06/2019, 8:53 AM

## 2019-04-06 NOTE — Progress Notes (Signed)
Patient had 19 beat run of VT. Elink notified, will continue to monitor.

## 2019-04-07 ENCOUNTER — Inpatient Hospital Stay (HOSPITAL_COMMUNITY): Payer: Medicare HMO

## 2019-04-07 LAB — BASIC METABOLIC PANEL
Anion gap: 6 (ref 5–15)
BUN: 38 mg/dL — ABNORMAL HIGH (ref 8–23)
CO2: 20 mmol/L — ABNORMAL LOW (ref 22–32)
Calcium: 8.1 mg/dL — ABNORMAL LOW (ref 8.9–10.3)
Chloride: 123 mmol/L — ABNORMAL HIGH (ref 98–111)
Creatinine, Ser: 0.97 mg/dL (ref 0.61–1.24)
GFR calc Af Amer: >60 mL/min (ref >60)
GFR calc non Af Amer: >60 mL/min (ref >60)
Glucose, Bld: 169 mg/dL — ABNORMAL HIGH (ref 70–99)
Potassium: 3.7 mmol/L (ref 3.5–5.1)
Sodium: 149 mmol/L — ABNORMAL HIGH (ref 135–145)

## 2019-04-07 LAB — CBC
HCT: 26.9 % — ABNORMAL LOW (ref 39.0–52.0)
Hemoglobin: 9.2 g/dL — ABNORMAL LOW (ref 13.0–17.0)
MCH: 27.9 pg (ref 26.0–34.0)
MCHC: 34.2 g/dL (ref 30.0–36.0)
MCV: 81.5 fL (ref 80.0–100.0)
Platelets: 293 10*3/uL (ref 150–400)
RBC: 3.3 MIL/uL — ABNORMAL LOW (ref 4.22–5.81)
RDW: 22.3 % — ABNORMAL HIGH (ref 11.5–15.5)
WBC: 32.6 10*3/uL — ABNORMAL HIGH (ref 4.0–10.5)
nRBC: 0 % (ref 0.0–0.2)

## 2019-04-07 LAB — GLUCOSE, CAPILLARY
Glucose-Capillary: 124 mg/dL — ABNORMAL HIGH (ref 70–99)
Glucose-Capillary: 125 mg/dL — ABNORMAL HIGH (ref 70–99)
Glucose-Capillary: 138 mg/dL — ABNORMAL HIGH (ref 70–99)
Glucose-Capillary: 148 mg/dL — ABNORMAL HIGH (ref 70–99)
Glucose-Capillary: 149 mg/dL — ABNORMAL HIGH (ref 70–99)
Glucose-Capillary: 180 mg/dL — ABNORMAL HIGH (ref 70–99)

## 2019-04-07 LAB — PREALBUMIN: Prealbumin: 8.1 mg/dL — ABNORMAL LOW (ref 18–38)

## 2019-04-07 LAB — MAGNESIUM: Magnesium: 2 mg/dL (ref 1.7–2.4)

## 2019-04-07 NOTE — Progress Notes (Signed)
9 Days Post-Op     Subjective: He is looking around some today.  He is following some commands.  His abdomen remains soft with good output through the ileostomy.  Wound VAC in place.  Objective: Vital signs in last 24 hours: Temp:  [98.1 F (36.7 C)-99.9 F (37.7 C)] 99.9 F (37.7 C) (10/11 0742) Pulse Rate:  [103-122] 118 (10/11 0900) Resp:  [18-35] 28 (10/11 0900) BP: (106-147)/(44-101) 110/50 (10/11 0900) SpO2:  [94 %-100 %] 100 % (10/11 0900) FiO2 (%):  [40 %] 40 % (10/11 0725) Weight:  [73.6 kg] 73.6 kg (10/11 0431) Last BM Date: 04/07/19 2122 feet 1000 IV 1810 urine 550 stool T-max 99.9 tachycardic 112-116 range Blood pressure stable off pressors And a 149, chloride 123, glucose 169, creatinine 0.97, mag 2.0, prealbumin 8.1 WBC 32.6 CXR today shows improvement in diffuse airspace disease.  Small left pleural effusion Intake/Output from previous day: 10/10 0701 - 10/11 0700 In: 3063.1 [I.V.:596.4; NG/GT:2065; IV Piggyback:401.7] Out: 2360 [Urine:1810; Stool:550] Intake/Output this shift: No intake/output data recorded.  General appearance: alert, cooperative and no distress Resp: clear to auscultation bilaterally and Anterior GI: Soft, he shakes his head no when I ask him if he has pain when pressing on his abdomen.  Wound VAC in place, tolerating tube feedings, good output from his ostomy.  Lab Results:  Recent Labs    04/06/19 0448 04/07/19 0440  WBC 37.4* 32.6*  HGB 9.7* 9.2*  HCT 28.4* 26.9*  PLT 268 293    BMET Recent Labs    04/06/19 2048 04/07/19 0440  NA 151* 149*  K 3.5 3.7  CL 126* 123*  CO2 21* 20*  GLUCOSE 141* 169*  BUN 37* 38*  CREATININE 0.94 0.97  CALCIUM 8.2* 8.1*   PT/INR No results for input(s): LABPROT, INR in the last 72 hours.  Recent Labs  Lab 04/01/19 0454  AST 37  ALT 33  ALKPHOS 62  BILITOT 2.0*  PROT 4.6*  ALBUMIN 1.5*     Lipase  No results found for: LIPASE   Medications: . chlorhexidine gluconate  (MEDLINE KIT)  15 mL Mouth Rinse BID  . Chlorhexidine Gluconate Cloth  6 each Topical Daily  . enoxaparin (LOVENOX) injection  40 mg Subcutaneous Daily  . free water  200 mL Per Tube Q4H  . gabapentin  100 mg Per Tube Q8H  . insulin aspart  0-15 Units Subcutaneous Q4H  . lip balm  1 application Topical BID  . mouth rinse  15 mL Mouth Rinse 10 times per day  . pantoprazole sodium  40 mg Per Tube BID   Anti-infectives (From admission, onward)   Start     Dose/Rate Route Frequency Ordered Stop   04/05/19 1200  ceFEPIme (MAXIPIME) 2 g in sodium chloride 0.9 % 100 mL IVPB     2 g 200 mL/hr over 30 Minutes Intravenous Every 12 hours 04/05/19 1131 04/15/19 0959   03/30/19 0600  cefoTEtan (CEFOTAN) 2 g in sodium chloride 0.9 % 100 mL IVPB    Note to Pharmacy: Pharmacy may adjust dose strength for optimal dosing.   Send with patient on call to the OR.  Anesthesia to complete antibiotic administration <42mn prior to incision per [facility]   2 g 200 mL/hr over 30 Minutes Intravenous To Short Stay 04/26/2019 1335 04/21/2019 1559   04/14/2019 2200  cefoTEtan (CEFOTAN) 2 g in sodium chloride 0.9 % 100 mL IVPB     2 g 200 mL/hr over 30 Minutes Intravenous  Every 12 hours 04/20/2019 1902 04/14/2019 2214   04/20/2019 1730  vancomycin (VANCOCIN) 1,250 mg in sodium chloride 0.9 % 250 mL IVPB  Status:  Discontinued     1,250 mg 166.7 mL/hr over 90 Minutes Intravenous Every 24 hours 04/17/2019 1720 04/08/2019 0928   04/22/2019 1400  neomycin (MYCIFRADIN) tablet 1,000 mg  Status:  Discontinued     1,000 mg Oral 3 times per day 04/17/2019 0620 04/23/2019 0839   04/13/2019 1400  metroNIDAZOLE (FLAGYL) tablet 1,000 mg  Status:  Discontinued     1,000 mg Oral 3 times per day 04/16/2019 0620 04/06/2019 0839   04/26/2019 1013  vancomycin variable dose per unstable renal function (pharmacist dosing)  Status:  Discontinued      Does not apply See admin instructions 04/27/2019 1014 04/27/2019 1908   03/30/2019 0930  metroNIDAZOLE (FLAGYL) IVPB  500 mg  Status:  Discontinued     500 mg 100 mL/hr over 60 Minutes Intravenous Every 8 hours 04/09/2019 0920 04/01/19 1054   03/31/2019 0630  cefoTEtan (CEFOTAN) 2 g in sodium chloride 0.9 % 100 mL IVPB  Status:  Discontinued     2 g 200 mL/hr over 30 Minutes Intravenous On call to O.R. 04/20/2019 1025 03/31/2019 0839   04/10/2019 0630  clindamycin (CLEOCIN) 900 mg, gentamicin (GARAMYCIN) 240 mg in sodium chloride 0.9 % 1,000 mL for intraperitoneal lavage  Status:  Discontinued      Irrigation To Surgery 04/25/2019 0620 04/22/2019 0840   04/24/2019 0500  ceFEPIme (MAXIPIME) 2 g in sodium chloride 0.9 % 100 mL IVPB  Status:  Discontinued     2 g 200 mL/hr over 30 Minutes Intravenous Every 12 hours 04/13/2019 1720 04/04/19 0459   04/17/2019 1630  ceFEPIme (MAXIPIME) 2 g in sodium chloride 0.9 % 100 mL IVPB     2 g 200 mL/hr over 30 Minutes Intravenous  Once 04/06/2019 1617 04/24/2019 1804   04/05/2019 1630  metroNIDAZOLE (FLAGYL) IVPB 500 mg     500 mg 100 mL/hr over 60 Minutes Intravenous  Once 04/18/2019 1617 03/31/2019 1928   04/07/2019 1630  vancomycin (VANCOCIN) IVPB 1000 mg/200 mL premix  Status:  Discontinued     1,000 mg 200 mL/hr over 60 Minutes Intravenous  Once 04/24/2019 1617 04/24/2019 1626   04/27/2019 1630  vancomycin (VANCOCIN) 1,500 mg in sodium chloride 0.9 % 500 mL IVPB     1,500 mg 250 mL/hr over 120 Minutes Intravenous  Once 04/13/2019 1626 04/17/2019 2157      Assessment/Plan Syncope Shock/sepsis Acute hypoxic respiratory failure/empysema  - HCAP - Candida - extubated 04/04/19-bronchoscopy and reintubated 04/04/2019 Lung mass left upper lobe - OP follow up Aspiration pneumonia Hx right bundle branch block Chronic orthostatic hypotension Failure to thrive  Severe Malnutrition -prealbumin 8.5(27/78) Acute metabolic encephalopathy Hypokalemia, hypomagnesemia, hypophosphatemia, hypernatremia Severe protein calorie malnutrition-on tube feeds55  ml/hr Leukocytosis12.4(10/1)>>23.2(10/5)>>28.4(10/6)>>32.6(10/8) Anemia transfused x 4 PRBC Acute metabolic encephalopathy Sjogren's syndrome ?  Bulky transverse colon mass, jejunal ischemia due to adhesions to transverse colon and omentum causing closed-loop volvulus. S/p exploratory laparotomy, extended right colectomy with end ileostomy/[place], jejunal small bowel resection with anastomosis, wedge biopsy of liver mass, application of wound VAC over closed fascia, 04/11/2019, Dr. Michael Boston POD #8  - leukocytosis 12.4(10/1)>>23.2(10/4)>>28.7(10/7)>>33.9(10/9)>>37.4(10/10)>>32.6(10/11)  FEN: Tube feeding/IV fluids/n.p.o./ - currently off VTE: Lovenox EU:[id number] pre op;Flagyl 10/1-10/5; cefepime 10/2-10/7; restarted 10/9 >> day 2/10    Plan: Continue current treatment per CCM.  LOS: 10 days    Raymar Joiner 04/07/2019 [phone]

## 2019-04-07 NOTE — Progress Notes (Signed)
NAME:  Darryl Johns, MRN:  [id number], DOB:  12-24-1934, LOS: 70 ADMISSION DATE:  04/11/2019, CONSULTATION DATE:   REFERRING MD: Velna Ochs MD, CHIEF COMPLAINT:  GI bleed  Brief History   84 yo male former smoker presented to ER with altered mental status after falling.  Found to have hematemesis and melena with hemorrhagic shock.  CT imaging showed transverse colon mass and SB volvulus.  Required ventilatory support and pressors.  Past Medical History  RBBB, Sensorimotor axonal peripheral neuropathy, HTN, allergies, colon mass, Sjogren's syndrome, Emphysema  Significant Hospital Events   10/02 laparotomy with Rt colectomy and end ileostomy, jejunal SB resection with anastomosis, wedge bx of liver mass, application of wound vac over closed fascia  10/06 off pressors 10/08 extubated >> reintubated with concern for airway protection  Consults:  GI Surgery  Procedures:  ETT 10/2 >> 10/8 Rt [place] CVL 10/2 >> ETT 10/8 >>  Significant Diagnostic Tests:  CTA 10/1 >> no PE, right lower lobe consolidation.  1.4 cm left upper lobe nodule.  Emphysema, aortic, coronary atherosclerosis. CT head / neck 10/1 >> chronic ischemic changes.  No acute abnormalities.  Micro Data:  COVID 10/1 >> negative  Tracheal aspirate 10/4 >> Predom PMN, Rare yeast BCx2 10/1 >> negative BAL 10/8 >> Candida albicans  Antimicrobials:  Cefepime 10/1 >> 10/7 Cefepime 10/9 >>   Interim history/subjective:  Remains on vent  Objective   Blood pressure (!) 128/59, pulse (!) 113, temperature 99.9 F (37.7 C), temperature source Oral, resp. rate (!) 31, height _0  (1.88 m), weight 73.6 kg, SpO2 95 %.    Vent Mode: PRVC FiO2 (%):  [40 %] 40 % Set Rate:  [14 bmp] 14 bmp Vt Set:  [570 mL] 570 mL PEEP:  [5 cmH20] 5 cmH20 Plateau Pressure:  [22 cmH20-28 cmH20] 24 cmH20   Intake/Output Summary (Last 24 hours) at 04/07/2019 0839 Last data filed at 04/07/2019 6629 Gross per 24 hour  Intake 2977.6 ml   Output 2215 ml  Net 762.6 ml   Filed Weights   04/05/19 0416 04/06/19 0407 04/07/19 0431  Weight: 70.8 kg 70.8 kg 73.6 kg    Examination:.  General - cachectic Eyes - pupils reactive ENT - no sinus tenderness, no stridor Cardiac - regular rate/rhythm, no murmur Chest - b/l crackles Abdomen - wound vac in place Extremities - 1+ edema Skin - no rashes Neuro - RASS -1  CXR - b/l ASD (reviewed by me)  Resolved Hospital Problem list   Lactic Acidosis, Hemorrhagic shock, Acute GI bleed, Elevated ammonia in setting of GI bleed  Assessment & Plan:    Acute hypoxic respiratory failure. Emphysema. Interstitial edema. - not able to tolerate vent weaning at this time - concerned he might need trach - will need to discuss goals of care with family - f/u CXR - prn BDs  HCAP. - Candida in BAL likely colonization - continue cefepime  Lt upper lobe nodule. - will need assessment as outpt  Colon cancer, SB volvulus, GI bleed s/p laparotomy. Severe protein calorie malnutrition. - post op care per surgery  Hypernatremia. - continue free water - f/u BMET - hold lasix for now  Acute metabolic encephalopathy. Recent w/u for peripheral neuropathy with concern for Sjogren's syndrome. - RASS goal 0 to -1 - continue neurontin   Best practice:  Diet: tube feeds DVT prophylaxis: SCDs GI prophylaxis: protonix Mobility: Bed Code Status: Full Code   Disposition: ICU  Labs    CMP Latest Ref  Rng & Units 04/07/2019 04/06/2019 04/06/2019  Glucose 70 - 99 mg/dL 169(H) 141(H) 181(H)  BUN 8 - 23 mg/dL 38(H) 37(H) 36(H)  Creatinine 0.61 - 1.24 mg/dL 0.97 0.94 1.02  Sodium 135 - 145 mmol/L 149(H) 151(H) 151(H)  Potassium 3.5 - 5.1 mmol/L 3.7 3.5 4.0  Chloride 98 - 111 mmol/L 123(H) 126(H) 125(H)  CO2 22 - 32 mmol/L 20(L) 21(L) 21(L)  Calcium 8.9 - 10.3 mg/dL 8.1(L) 8.2(L) 8.2(L)  Total Protein 6.5 - 8.1 g/dL - - -  Total Bilirubin 0.3 - 1.2 mg/dL - - -  Alkaline Phos 38 - 126  U/L - - -  AST 15 - 41 U/L - - -  ALT 0 - 44 U/L - - -   CBC Latest Ref Rng & Units 04/07/2019 04/06/2019 04/05/2019  WBC 4.0 - 10.5 K/uL 32.6(H) 37.4(H) 33.9(H)  Hemoglobin 13.0 - 17.0 g/dL 9.2(L) 9.7(L) 9.8(L)  Hematocrit 39.0 - 52.0 % 26.9(L) 28.4(L) 29.0(L)  Platelets 150 - 400 K/uL 293 268 226   ABG    Component Value Date/Time   PHART 7.391 04/04/2019 2214   PCO2ART 33.3 04/04/2019 2214   PO2ART 66.0 (L) 04/04/2019 2214   HCO3 20.0 04/04/2019 2214   TCO2 21 (L) 04/04/2019 2214   ACIDBASEDEF 4.0 (H) 04/04/2019 2214   O2SAT 92.0 04/04/2019 2214   CBG (last 3)  Recent Labs    04/07/19 0018 04/07/19 0418 04/07/19 [place], MD [place] Pulmonary/Critical Care 04/07/2019, 8:39 AM

## 2019-04-08 ENCOUNTER — Inpatient Hospital Stay (HOSPITAL_COMMUNITY): Payer: Medicare HMO

## 2019-04-08 ENCOUNTER — Ambulatory Visit: Payer: Medicare HMO | Admitting: Adult Health

## 2019-04-08 DIAGNOSIS — I361 Nonrheumatic tricuspid (valve) insufficiency: Secondary | ICD-10-CM

## 2019-04-08 DIAGNOSIS — I340 Nonrheumatic mitral (valve) insufficiency: Secondary | ICD-10-CM

## 2019-04-08 LAB — CBC
HCT: 24.7 % — ABNORMAL LOW (ref 39.0–52.0)
Hemoglobin: 8.6 g/dL — ABNORMAL LOW (ref 13.0–17.0)
MCH: 28.8 pg (ref 26.0–34.0)
MCHC: 34.8 g/dL (ref 30.0–36.0)
MCV: 82.6 fL (ref 80.0–100.0)
Platelets: 293 10*3/uL (ref 150–400)
RBC: 2.99 MIL/uL — ABNORMAL LOW (ref 4.22–5.81)
RDW: 23.3 % — ABNORMAL HIGH (ref 11.5–15.5)
WBC: 26 10*3/uL — ABNORMAL HIGH (ref 4.0–10.5)
nRBC: 0 % (ref 0.0–0.2)

## 2019-04-08 LAB — BASIC METABOLIC PANEL
Anion gap: 6 (ref 5–15)
BUN: 45 mg/dL — ABNORMAL HIGH (ref 8–23)
CO2: 21 mmol/L — ABNORMAL LOW (ref 22–32)
Calcium: 8.1 mg/dL — ABNORMAL LOW (ref 8.9–10.3)
Chloride: 126 mmol/L — ABNORMAL HIGH (ref 98–111)
Creatinine, Ser: 0.98 mg/dL (ref 0.61–1.24)
GFR calc Af Amer: >60 mL/min (ref >60)
GFR calc non Af Amer: >60 mL/min (ref >60)
Glucose, Bld: 188 mg/dL — ABNORMAL HIGH (ref 70–99)
Potassium: 3 mmol/L — ABNORMAL LOW (ref 3.5–5.1)
Sodium: 153 mmol/L — ABNORMAL HIGH (ref 135–145)

## 2019-04-08 LAB — HEPARIN LEVEL (UNFRACTIONATED): Heparin Unfractionated: 0.16 IU/mL — ABNORMAL LOW (ref 0.30–0.70)

## 2019-04-08 LAB — GLUCOSE, CAPILLARY
Glucose-Capillary: 151 mg/dL — ABNORMAL HIGH (ref 70–99)
Glucose-Capillary: 160 mg/dL — ABNORMAL HIGH (ref 70–99)
Glucose-Capillary: 178 mg/dL — ABNORMAL HIGH (ref 70–99)
Glucose-Capillary: 188 mg/dL — ABNORMAL HIGH (ref 70–99)
Glucose-Capillary: 213 mg/dL — ABNORMAL HIGH (ref 70–99)
Glucose-Capillary: 219 mg/dL — ABNORMAL HIGH (ref 70–99)
Glucose-Capillary: 242 mg/dL — ABNORMAL HIGH (ref 70–99)

## 2019-04-08 LAB — ECHOCARDIOGRAM COMPLETE
Height: 74 in
Weight: 2723.12 oz

## 2019-04-08 MED ORDER — METOPROLOL TARTRATE 25 MG/10 ML ORAL SUSPENSION
25.0000 mg | Freq: Two times a day (BID) | ORAL | Status: DC
  Administered 2019-04-08 – 2019-04-10 (×5): 25 mg
  Filled 2019-04-08 (×5): qty 10

## 2019-04-08 MED ORDER — DEXTROSE 5 % IV SOLN
INTRAVENOUS | Status: DC
  Administered 2019-04-08 – 2019-04-11 (×5): via INTRAVENOUS

## 2019-04-08 MED ORDER — POTASSIUM CHLORIDE 20 MEQ/15ML (10%) PO SOLN
30.0000 meq | ORAL | Status: AC
  Administered 2019-04-08 (×2): 30 meq
  Filled 2019-04-08 (×2): qty 30

## 2019-04-08 MED ORDER — HEPARIN (PORCINE) 25000 UT/250ML-% IV SOLN
1500.0000 [IU]/h | INTRAVENOUS | Status: DC
  Administered 2019-04-08: 1100 [IU]/h via INTRAVENOUS
  Administered 2019-04-09 (×2): 1500 [IU]/h via INTRAVENOUS
  Filled 2019-04-08 (×3): qty 250

## 2019-04-08 NOTE — Consult Note (Signed)
[place] Nurse wound consult note Reason for Consult: midline incisional wound, NPWT change (routine) Wound type:Surgical Pressure Injury POA: N/A Measurement:13cm x 1.8cm x 1cm.  Superficial at proximal portion, greatest width and depth at distal portion of wound.. Wound bed:red, moist Drainage (amount, consistency, odor) serous Periwound:intact. Mild maceration at umbilicus.  This area is protected with a skin barrier ring today. Dressing procedure/placement/frequency: 1 piece of black foam removed, wound cleansed and one piece of black foam used to obliterate dead space.  Order written for discontinuance of NPWT at next dressing change (Wednesday).  [place] Nurse ostomy consult note Stoma type/location: RUQ ileostomy Stomal assessment/size: 1 and 1/2 inches round, slightly long, edematous, os at center. Peristomal assessment: intact Treatment options for stomal/peristomal skin: skin barrier ring Output: light brown effluent (139mls emptied from pouch during changing) Ostomy pouching: 2pc. 2 and 3/4 inch pouching system with skin barrier ring Education provided: None.  Patient is intubated. Enrolled patient in [place] program: No   WOC nursing team will follow, and will remain available to this patient, the nursing, surgical and medical teams.   Thanks, Maudie Flakes, MSN, RN, [place], Arther Abbott  Pager# [phone]

## 2019-04-08 NOTE — Progress Notes (Signed)
NAME:  Darryl Johns, MRN:  [id number], DOB:  Feb 05, 1935, LOS: 17 ADMISSION DATE:  04/20/2019, CONSULTATION DATE:   REFERRING MD: Velna Ochs MD, CHIEF COMPLAINT:  GI bleed  Brief History   84 yo male former smoker presented to ER with altered mental status after falling.  Found to have hematemesis and melena with hemorrhagic shock.  CT imaging showed transverse colon mass and SB volvulus.  Required ventilatory support and pressors.  Past Medical History  RBBB, Sensorimotor axonal peripheral neuropathy, HTN, allergies, colon mass, Sjogren's syndrome, Emphysema  Significant Hospital Events   10/02 laparotomy with Rt colectomy and end ileostomy, jejunal SB resection with anastomosis, wedge bx of liver mass, application of wound vac over closed fascia  10/06 off pressors 10/08 extubated >> reintubated with concern for airway protection  Consults:  GI Surgery  Procedures:  ETT 10/2 >> 10/8 Rt [place] CVL 10/2 >> ETT 10/8 >>  Significant Diagnostic Tests:  CTA 10/1 >> no PE, right lower lobe consolidation.  1.4 cm left upper lobe nodule.  Emphysema, aortic, coronary atherosclerosis. CT head / neck 10/1 >> chronic ischemic changes.  No acute abnormalities.  Micro Data:  COVID 10/1 >> negative  Tracheal aspirate 10/4 >> Predom PMN, Rare yeast BCx2 10/1 >> negative BAL 10/8 >> Candida albicans  Antimicrobials:  Cefepime 10/1 >> 10/7 Cefepime 10/9 >> 10/12  Interim history/subjective:  Remains on vent.  Objective   Blood pressure (!) 123/56, pulse (!) 112, temperature 98.3 F (36.8 C), temperature source Oral, resp. rate (!) 25, height _0  (1.88 m), weight 77.2 kg, SpO2 99 %.    Vent Mode: PSV;CPAP FiO2 (%):  [40 %] 40 % Set Rate:  [24 bmp] 24 bmp Vt Set:  [570 mL] 570 mL PEEP:  [5 cmH20] 5 cmH20 Pressure Support:  [5 cmH20] 5 cmH20 Plateau Pressure:  [12 cmH20-25 cmH20] 12 cmH20   Intake/Output Summary (Last 24 hours) at 04/08/2019 0753 Last data filed at 04/08/2019  0600 Gross per 24 hour  Intake 2186.47 ml  Output 2070 ml  Net 116.47 ml   Filed Weights   04/06/19 0407 04/07/19 0431 04/08/19 0357  Weight: 70.8 kg 73.6 kg 77.2 kg    Examination:.  General - cachectic Eyes - pupils reactive ENT - ETT  In place Cardiac - irregular, tachycardic Chest - b/l rhonchi Abdomen - wound vac in place Extremities - 1+ edema Skin - no rashes Neuro - somnolent, follows commands, moves all extremities  CXR (reviewed by me) - b/l ASD  Resolved Hospital Problem list   Lactic Acidosis, Hemorrhagic shock, Acute GI bleed, Elevated ammonia in setting of GI bleed  Assessment & Plan:    Acute hypoxic respiratory failure. Emphysema. Interstitial edema. - he is very weak, and don't think he can tolerate extubation trial at this time - might need trach to assist with vent weaning - f/u CXR intermittently - prn BDs  HCAP. - Candida in BAL likely colonization - d/c ABx after dose on 10/12  Tachycardia 04/08/19. - check ECG - add lopressor  Lt upper lobe nodule. - will need assessment as outpt  Colon cancer, SB volvulus, GI bleed s/p laparotomy. Severe protein calorie malnutrition. - post op care per surgery  Hypernatremia. - change IV fluid to D5W at 50 ml/hr - continue free water - f/u BMET  Hypokalemia. - replace as needed  Anemia of critical illness. - f/u CBC - transfuse for Hb < 7 or significant bleeding  Acute metabolic encephalopathy. Recent w/u for  peripheral neuropathy with concern for Sjogren's syndrome. - RASS goal 0 to -1 - continue neurontin   Best practice:  Diet: tube feeds DVT prophylaxis: SCDs GI prophylaxis: protonix Mobility: Bed Code Status: Full Code   Disposition: ICU  Labs    CMP Latest Ref Rng & Units 04/08/2019 04/07/2019 04/06/2019  Glucose 70 - 99 mg/dL 188(H) 169(H) 141(H)  BUN 8 - 23 mg/dL 45(H) 38(H) 37(H)  Creatinine 0.61 - 1.24 mg/dL 0.98 0.97 0.94  Sodium 135 - 145 mmol/L 153(H) 149(H)  151(H)  Potassium 3.5 - 5.1 mmol/L 3.0(L) 3.7 3.5  Chloride 98 - 111 mmol/L 126(H) 123(H) 126(H)  CO2 22 - 32 mmol/L 21(L) 20(L) 21(L)  Calcium 8.9 - 10.3 mg/dL 8.1(L) 8.1(L) 8.2(L)  Total Protein 6.5 - 8.1 g/dL - - -  Total Bilirubin 0.3 - 1.2 mg/dL - - -  Alkaline Phos 38 - 126 U/L - - -  AST 15 - 41 U/L - - -  ALT 0 - 44 U/L - - -   CBC Latest Ref Rng & Units 04/08/2019 04/07/2019 04/06/2019  WBC 4.0 - 10.5 K/uL 26.0(H) 32.6(H) 37.4(H)  Hemoglobin 13.0 - 17.0 g/dL 8.6(L) 9.2(L) 9.7(L)  Hematocrit 39.0 - 52.0 % 24.7(L) 26.9(L) 28.4(L)  Platelets 150 - 400 K/uL 293 293 268   ABG    Component Value Date/Time   PHART 7.391 04/04/2019 2214   PCO2ART 33.3 04/04/2019 2214   PO2ART 66.0 (L) 04/04/2019 2214   HCO3 20.0 04/04/2019 2214   TCO2 21 (L) 04/04/2019 2214   ACIDBASEDEF 4.0 (H) 04/04/2019 2214   O2SAT 92.0 04/04/2019 2214   CBG (last 3)  Recent Labs    04/07/19 1955 04/08/19 0045 04/08/19 0321  GLUCAP 180* 188* 151*   CC time 32 minutes  Chesley Mires, MD [facility] Pulmonary/Critical Care 04/08/2019, 7:53 AM

## 2019-04-08 NOTE — Progress Notes (Signed)
ANTICOAGULATION CONSULT NOTE  Pharmacy Consult for heparin Indication: atrial fibrillation  Heparin Dosing Weight: 77.2 kg  Labs: Recent Labs    04/06/19 0448 04/06/19 2048 04/07/19 0440 04/08/19 0350  HGB 9.7*  --  9.2* 8.6*  HCT 28.4*  --  26.9* 24.7*  PLT 268  --  293 293  CREATININE 1.02 0.94 0.97 0.98    Assessment: 84 yom with new afib. Pharmacy consulted to start heparin drip. Pt is not on anticoagulation PTA, currently on Lovenox prophylactic dosing inpatient - last dose 10/11 at 0936. Hg drifting down to 8.6, plt wnl, last INR 1.7 on 10/8. Noted colon cancer, SB volvulus, GI bleed s/p laparotomy but no current active bleed issues. Ok to start heparin per Dr. Halford Chessman with no bolus.  Goal of Therapy:  Heparin level 0.3-0.7 units/ml Monitor platelets by anticoagulation protocol: Yes   Plan:  D/c Lovenox No bolus. Start heparin at 1100 units/h 8h heparin level Daily heparin level/CBC Monitor s/sx bleeding   Elicia Lamp, PharmD, BCPS Clinical Pharmacist 04/08/2019 8:36 AM

## 2019-04-08 NOTE — Progress Notes (Signed)
Darryl Johns [id number] Nov 01, 1934  CARE TEAM:  PCP: Kristie Cowman, MD  Outpatient Care Team: Patient Care Team: Kristie Cowman, MD as PCP - General (Family Medicine) Debara Pickett, Nadean Corwin, MD as Consulting Physician (Cardiology) Michael Boston, MD as Consulting Physician (Colon and Rectal Surgery) Coral Spikes, MD as Consulting Physician (Gastroenterology) Kathrynn Ducking, MD as Consulting Physician (Neurology)  Inpatient Treatment Team: Treatment Team: Attending Provider: Chesley Mires, MD; Rounding Team: Pccm, Md, MD; Registered Nurse: Tenna Child, RN; Consulting Physician: Edison Pace, Md, MD; Consulting Physician: Michael Boston, MD; Registered Nurse: Jackalyn Lombard, RN; Registered Nurse: Romona Curls, RN; Case Manager: Bartholomew Crews, RN; Utilization Review: Barbara Cower, RN   Problem List:   Principal Problem:   Acute ischemia of jejunum s/p SB resection 04/18/2019 Active Problems:   Cancer of transverse colon pT4b, 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   Protein-calorie malnutrition, severe   Lung mass - primary vs colon metastasis   Essential hypertension   RBBB   Dizziness   Hypotension   Peripheral neuropathy   Gait abnormality   Sepsis ([place])   HOH (hard of hearing)   Coffee ground emesis   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[place])   Shock ([place])   Cachexia ([place])   Elevated carcinoembryonic antigen (CEA)   Recent unintentional weight loss over several months   Hemorrhagic shock ([place])   AKI (acute kidney injury) ([place])   Emphysema of lung ([place])   Coagulopathy ([place])   Colon bleed requiring more than 4 units of blood in 24 hours, ICU, or surgery   Generalized anxiety disorder   Acute respiratory distress   Aspiration into airway   Acute respiratory failure with hypoxia ([place])   Pressure injury of skin   10 Days Post-Op  04/05/2019  POST-OPERATIVE DIAGNOSIS:   Bulky transverse colon mass.  Probable cancer  Jejunal ischemia due to adhesions to transverse  colon cancer and omentum causing closed-loop volvulus   PROCEDURE:  EXPLORATORY LAPAROTOMY EXTENDED RIGHT COLECTOMY WITH END ILEOSTOMY / HARTMANN JEJUNAL SMALL BOWEL RESECTION WITH ANASTOMOSIS WEDGE BIOPSY OF LIVER MASS APPLICATION OF WOUND VAC OVER CLOSED FASCIA  SURGEON:  Adin Hector, MD  OR FINDINGS:  Bulky mid transverse colonic mass very inflamed.  Suspicious for possible microperforation but no feculent contamination.  Moderate ascites.  Proximal jejunum just distal ligament of Treitz x2 feet twisted around to the colon tumor and trapped by omentum causing a volvulus and closed-loop type obstruction.  Distal jejunum with edema.  Ileum without inflammation. Patient has a side-to-side staple anastomosis of the very proximal jejunum at the ligament of Treitz to mid jejunum isoperistaltic.  Proximal colon including tumor excised en bloc with proximal jejunum.  End ileostomy in right supraumbilical paramedian region.  Prolene sutures on the proximal Hartmann staple line at the distal transverse colon near the splenic flexure.  Hard fibrotic 5 mm nodule on the dome of the right liver.  Wedge biopsy done.  No other evidence of any intraperitoneal carcinomatosis.  PATHOLOGY:  TRANSVERSE COLON CANCER mpT4b, pN1a   A. COLON JEJUNUM, RIGHT PROXIMAL, RESECTION:  - Right hemicolectomy specimen showing 2 separate tumors  1) Invasive poorly differentiated adenocarcinoma, 12 cm, involving  transverse colon, with marked necrosis  - Carcinoma invades through the serosa into adherent jejunum  - Lymphovascular invasion is present   2) Invasive moderately differentiated adenocarcinoma, 1.6 cm, involving  transverse colon (2.8 cm proximal to the larger tumor)  - Carcinoma invades into pericolonic soft tissue  - Lymphovascular invasion  is present   - Resection margins are negative for carcinoma  - Metastatic carcinoma to one of thirty-six lymph nodes (1/36). See  comment  -  Unremarkable appendix  - See oncology table   B. LIVER, NODULE, WEDGE:  - Benign liver parenchyma showing a fibrotic nodule with calcification  - Negative for ova, parasites,granulomas or malignancy   Assessment  Guarded but stabilizing  [facility] Stay = 11 days)  Plan:  Tube feeds at goal.  Continue  Minimal vent settings.  Weaning parameters for critical care.  Weaning trial OK this AM - ?maybe extubate.  I think he would benefit from diuresing.  I agree with critical care is concerns that he may require tracheostomy to or gradually weaned with trach collar trials  Good urine output and normal creatinine argues against renal failure.  Consider more aggressive diuresis.  Omeprazole per NGT  Low K, Phos  & Mg - replacing as needed  Ileostomy care.  Usually patients in the long-term developed high output ileostomy requiring a antidiarrheal regimen of psyllium, iron, & as needed Imodium or Lomotil.  We will have to see how this plays out.  Sometimes people need a PICC line and IV fluids for 6 weeks to avoid dehydration.  We will see  Wound VAC is on the SQ.  Change Monday Wednesday Friday.  Can switch to packing if closed down.  Initially request to get a packing only over the weekend but now they wish to continue it this week.  Pathology:  Adenocarcinoma of transverse colon invading into the jejunum.  Stage III.  Ideally would benefit from post adjuvant chemotherapy.  Suspect with his advanced age he would just get post-adjuvant oral capecitabine/Xeloda.  Clearly we need to wait to see how he will do with the emergency surgery.  Dr. Burr Medico aware and will follow up with him as an outpatient.  No need to continue IV antibiotics from a surgical standpoint.  He did not have frank severe peritonitis and is been 72 hours since surgery.  Defer to critical care/medicine if they feel he needs it for pulmonary or other concerns.    35 minutes spent in review, evaluation, examination,  counseling, and coordination of care.  More than 50% of that time was spent in counseling.  I updated the patient's status to the patient's nurse.  Recommendations were made.  Questions were answered.  She expressed understanding & appreciation.    04/08/2019    Subjective: (Chief complaint)  Reintubated.  Minimal vent settings.  Trying weaning.  Some mild secretions in the ET tube.  Patient more alert.  Gets tachycardic when sedation held back.  Objective:  Vital signs:  Vitals:   04/08/19 0500 04/08/19 0600 04/08/19 0700 04/08/19 0724  BP: (!) 117/51 (!) 126/47 (!) 123/56   Pulse: (!) 110 (!) 111 (!) 108 (!) 112  Resp: (!) 27 (!) 28 (!) 26 (!) 25  Temp:      TempSrc:      SpO2: 100% 96% 95% 99%  Weight:      Height:        Last BM Date: 04/07/19  Intake/Output   Yesterday:  10/11 0701 - 10/12 0700 In: 2186.5 [I.V.:651.3; XI/HW:3888; IV Piggyback:200.1] Out: 2070 [KCMKL:4917; Stool:700] This shift:  No intake/output data recorded.  Bowel function:  Flatus: YES  BM:  YES  Drain: (No drain)   Physical Exam:  General: Pt awake/alert/oriented x4 in mild acute distress.  Moderate anasarca Eyes: PERRL, normal EOM.  Sclera clear.  No icterus Neuro: CN II-XII intact w/o focal sensory/motor deficits. Lymph: No head/neck/groin lymphadenopathy Psych:  No delerium/psychosis/paranoia HENT: Normocephalic, Mucus membranes moist.  No thrush NG tube in place.  ET tube in place Neck: Supple, No tracheal deviation Chest: No chest wall pain w good excursion CV:  Pulses intact.  Regular rhythm MS: Normal AROM mjr joints.  No obvious deformity  Abdomen: Soft.  Nondistended.  Nontender.  Ileostomy right upper quadrant pink and edematous.  Wound VAC in midline incision clean.  No evidence of peritonitis.  No incarcerated hernias.  Ext:  2-3+ edema.  No deformity.  No mjr edema.  No cyanosis Skin: No petechiae / purpura  Results:   PATHOLOGY DIAGNOSIS:   A. COLON  JEJUNUM, RIGHT PROXIMAL, RESECTION:  - Right hemicolectomy specimen showing 2 separate tumors  1) Invasive poorly differentiated adenocarcinoma, 12 cm, involving  transverse colon, with marked necrosis  - Carcinoma invades through the serosa into adherent jejunum  - Lymphovascular invasion is present   2) Invasive moderately differentiated adenocarcinoma, 1.6 cm, involving  transverse colon (2.8 cm proximal to the larger tumor)  - Carcinoma invades into pericolonic soft tissue  - Lymphovascular invasion is present   - Resection margins are negative for carcinoma  - Metastatic carcinoma to one of thirty-six lymph nodes (1/36). See  comment  - Unremarkable appendix  - See oncology table   B. LIVER, NODULE, WEDGE:  - Benign liver parenchyma showing a fibrotic nodule with calcification  - Negative for ova, parasites,granulomas or malignancy    COMMENT:   The histomorphology of metastatic carcinoma in the lymph node is  suggestive of metastasis from the smaller, moderately differentiated  adenocarcinoma.    ONCOLOGY TABLE:   COLON AND RECTUM: Resection, Including Transanal Disk Excision of  Rectal Neoplasms   Procedure: Resection, partial right colon and proximal jejunum  Tumor Site: Transverse colon  Tumor Size: 12.0 cm  Macroscopic Tumor Perforation: Not identified  Histologic Type: Adenocarcinoma  Histologic Grade: G3: Poorly differentiated  Tumor Extension: Tumor invades through serosa into adherent jejunum  Margins: Uninvolved by tumor  Treatment Effect: No known presurgical therapy  Lymphovascular Invasion: Present  Perineural Invasion: Not identified  Tumor Deposits: Not identified  Regional Lymph Nodes:    Number of Lymph Nodes Involved: 1    Number of Lymph Nodes Examined: 36  Pathologic Stage Classification (pTNM, AJCC 8th Edition): mpT4b, pN1a  Ancillary Studies: MMR / MSItesting will be ordered.  Representative Tumor Block: A8  Comments: A  separate, invasive moderately differential adenocarcinoma  measuring 1.6 cm was identified 2.8 cm proximal to the larger tumor   (v4.1.0.0)      Brevyn Ring DESCRIPTION:   A: Specimen: Received fresh labeled right colon and proximal jejunum  Specimen integrity: Specimen consists of an unoriented segment of small  intestine with 2 stapled resection margins, and attached portion of  colon (including terminal ileum, right colon, ascending, hepatic flexure  and portion of transverse colon)  Specimen length: Separate segment of jejunum measures 57.5 cm in length,  and the colon specimen displays 3.0 cm of terminal ileum and 45 cm of  colon.  Mesorectal intactness: Nonapplicable  Tumor location: Within the transverse colon, and extending through the  wall into the adjacent adherent portion of jejunum. Possible second  lesion identified, see mucosal polyp heading.  Tumor size: There is a 12.0 x 8.1 cm tan-gray, firm, circumferential  mass within the distal portion of the colon. The lesion appears to  invade through the wall,  and extends into the lumen of the attached  additional portion of jejunum. Adipose tissue surrounding the jejunum  is indurated and slightly nodular.  Percent of bowel circumference involved: 100% of colon circumference,  and approximately 50% of small bowel circumference  Tumor distance to margins:            Proximal: 28.0 cm to the terminal ileum resection  margin            Distal: 5.3 cm from the distal colon resection  margin            Mesenteric (sigmoid and transverse): The serosa and  adipose tissue underlying the bulky colon tumor is puckered and  indurated. The indurated tissue measures approximately 1.9 cm from the  presumed mesenteric margin.            Radial (posterior ascending, posterior descending;  lateral and posterior mid-rectum; and entire lower 1/3 rectum):  Nonapplicable  Macroscopic extent of  tumor invasion: Sectioning through the flattened  polyp proximal to the primary lesion reveals a tan-white solid cut  surface, which grossly extends into the muscularis propria and possible  adipose tissue. Sectioning through the primary colon lesion reveals a  tan-white to red cut surface, invading through the muscularis propria  into the surrounding adipose tissue.  Total presumed lymph nodes: 36 tan-gray possible lymph nodes are  identified, ranging from 0.2 cm to 1.4 x 1.0 0.7 cm.  Extramural satellite tumor nodules: None identified  Mucosal polyp(s): 2.8 cm proximal to the main colon lesion there is a  1.6 x 1.3 x 0.2 cm tan-brown, firm, flattened possible secondary lesion  identified. No additional polyps are identified.  Additional findings: Tumor invading into the jejunum measures 23.5 cm to  the closest margin and 33.0 cm from the opposing small bowel margin.  The segment of jejunum is partially twisted around the indurated adipose  tissue. The uninvolved colonic mucosa is tan-gray with normal folding.  The appendix is present, measures 6.0 cm in length and up to 0.9 cm in  diameter. The serosal surface is tan-gray and smooth, the mucosa is  tan, the wall measures up to 0.2 cm in thickness, the lumen is  moderately dilated and filled with an abundant amount of tan-yellow  fecal material. The indurated and puckered serosa and adipose tissue  underlying the bulky tumor is inked black. The mucosa of the uninvolved  small bowel is tan-gray, slightly dusky and focally edematous.  Block summary:  1 = terminal ileum resection margin  2 = distal colon resection margin  3 = jejunal margin closest to lesion  4 = jejunal margin distal to lesion  5-7 = smaller, secondary colon lesion  8 and 9 = cross-section from colon to jejunum  10-14 = representative sections of primary lesion  15 = grossly uninvolved mucosa  16 = appendix  17 and 18 = 5 possible lymph nodes, each  19 and 20 =  4 possible lymph nodes, each  21-23 = 3 possible lymph nodes, each  24 and 25 = 2 possible lymph nodes, each  26 = 2 possible lymph nodes, differentially inked and bisected  27-29 = 1 possible lymph node bisected, each  30 = tissue for molecular studies   B: The specimen is received fresh and consists of a 1.6 x 1.6 x 0.5 cm  wedge portion of tan-pink, nodular soft tissue. The cut surface  displays a 0.3 cm in greatest dimension tan-white, firm, calcified  nodule. The specimen  is bisected and entirely submitted in 1 cassette  following decalcification.      Final Diagnosis performed by Jaquita Folds, MD.  Electronically  signed 04/01/2019  Technical and / or Professional components performed at [facility]. [facility], [place] [address], [place], [place] [address].  Immunohistochemistry Technical component (if applicable) was performed  at [facility]. [address] [place],  [place], [place] [address].  IMMUNOHISTOCHEMISTRY DISCLAIMER (if applicable):  Some of these immunohistochemical stains may have been developed and the  performance characteristics determine by [facility]. Some  may not have been cleared or approved by the U.S. Food and Drug  Administration. The FDA has determined that such clearance or approval  is not necessary. This test is used for clinical purposes. It should not  be regarded as investigational or for research. This laboratory is  certified under the [place]  (CLIA-88) as qualified to perform high complexity clinical laboratory  testing. The controls stained appropriately.   Cultures: Recent Results (from the past 720 hour(s))  SARS Coronavirus 2 [facility] order, Performed in [facility] hospital lab) Nasopharyngeal Nasopharyngeal Swab     Status: None   Collection Time: 04/03/2019  4:03 PM   Specimen: Nasopharyngeal Swab  Result Value Ref Range Status   SARS  Coronavirus 2 NEGATIVE NEGATIVE Final    Comment: (NOTE) If result is NEGATIVE SARS-CoV-2 target nucleic acids are NOT DETECTED. The SARS-CoV-2 RNA is generally detectable in upper and lower  respiratory specimens during the acute phase of infection. The lowest  concentration of SARS-CoV-2 viral copies this assay can detect is 250  copies / mL. A negative result does not preclude SARS-CoV-2 infection  and should not be used as the sole basis for treatment or other  patient management decisions.  A negative result may occur with  improper specimen collection / handling, submission of specimen other  than nasopharyngeal swab, presence of viral mutation(s) within the  areas targeted by this assay, and inadequate number of viral copies  (<250 copies / mL). A negative result must be combined with clinical  observations, patient history, and epidemiological information. If result is POSITIVE SARS-CoV-2 target nucleic acids are DETECTED. The SARS-CoV-2 RNA is generally detectable in upper and lower  respiratory specimens dur ing the acute phase of infection.  Positive  results are indicative of active infection with SARS-CoV-2.  Clinical  correlation with patient history and other diagnostic information is  necessary to determine patient infection status.  Positive results do  not rule out bacterial infection or co-infection with other viruses. If result is PRESUMPTIVE POSTIVE SARS-CoV-2 nucleic acids MAY BE PRESENT.   A presumptive positive result was obtained on the submitted specimen  and confirmed on repeat testing.  While 2019 novel coronavirus  (SARS-CoV-2) nucleic acids may be present in the submitted sample  additional confirmatory testing may be necessary for epidemiological  and / or clinical management purposes  to differentiate between  SARS-CoV-2 and other Sarbecovirus currently known to infect humans.  If clinically indicated additional testing with an alternate test   methodology [phone]) is advised. The SARS-CoV-2 RNA is generally  detectable in upper and lower respiratory sp ecimens during the acute  phase of infection. The expected result is Negative. Fact Sheet for Patients:  StrictlyIdeas.no Fact Sheet for Healthcare Providers: BankingDealers.co.za This test is not yet approved or cleared by the [place] FDA and has been authorized for detection and/or diagnosis of SARS-CoV-2 by  FDA under an Emergency Use Authorization (EUA).  This EUA will remain in effect (meaning this test can be used) for the duration of the COVID-19 declaration under Section 564(b)(1) of the Act, 21 U.S.C. section 360bbb-3(b)(1), unless the authorization is terminated or revoked sooner. Performed at [facility], [place] [address], [place], [place] [address]   Blood culture (routine x 2)     Status: None   Collection Time: 03/31/2019  7:55 PM   Specimen: BLOOD LEFT HAND  Result Value Ref Range Status   Specimen Description BLOOD LEFT HAND  Final   Special Requests   Final    BOTTLES DRAWN AEROBIC AND ANAEROBIC Blood Culture adequate volume   Culture   Final    NO GROWTH 5 DAYS Performed at [facility], [place] [address] [place], [place] [address]    Report Status 04/02/2019 FINAL  Final  Blood culture (routine x 2)     Status: None   Collection Time: 04/14/2019  8:04 PM   Specimen: BLOOD RIGHT HAND  Result Value Ref Range Status   Specimen Description BLOOD RIGHT HAND  Final   Special Requests   Final    BOTTLES DRAWN AEROBIC ONLY Blood Culture results may not be optimal due to an inadequate volume of blood received in culture bottles   Culture   Final    NO GROWTH 5 DAYS Performed at [facility], [place] [address] [place], [place] [address]    Report Status 04/02/2019 FINAL  Final  Urine culture     Status: None   Collection Time: 04/18/2019 12:35 AM   Specimen: Urine, Catheterized  Result Value Ref  Range Status   Specimen Description URINE, CATHETERIZED  Final   Special Requests NONE  Final   Culture   Final    NO GROWTH Performed at [facility], [place] [address] [place], [place] [address]    Report Status 04/21/2019 FINAL  Final  MRSA PCR Screening     Status: None   Collection Time: 04/13/2019 12:39 PM   Specimen: Nasal Mucosa; Nasopharyngeal  Result Value Ref Range Status   MRSA by PCR NEGATIVE NEGATIVE Final    Comment:        The GeneXpert MRSA Assay (FDA approved for NASAL specimens only), is one component of a comprehensive MRSA colonization surveillance program. It is not intended to diagnose MRSA infection nor to guide or monitor treatment for MRSA infections. Performed at [facility], [place] [address], [place], [place] [address]   Culture, respiratory (non-expectorated)     Status: None   Collection Time: 03/31/19 11:20 AM   Specimen: Tracheal Aspirate; Respiratory  Result Value Ref Range Status   Specimen Description TRACHEAL ASPIRATE  Final   Special Requests NONE  Final   Gram Stain   Final    RARE WBC PRESENT, PREDOMINANTLY PMN RARE YEAST Performed at [facility], [place] [address], [place], [place] [address]    Culture FEW CANDIDA ALBICANS  Final   Report Status 04/02/2019 FINAL  Final  Culture, bal-quantitative     Status: Abnormal   Collection Time: 04/04/19  8:49 PM   Specimen: Bronchoalveolar Lavage; Respiratory  Result Value Ref Range Status   Specimen Description BRONCHIAL ALVEOLAR LAVAGE  Final   Special Requests NONE  Final   Gram Stain   Final    NO WBC SEEN RARE BUDDING YEAST SEEN Performed at [facility], [address]  [place], [place] [address]    Culture 60,000 COLONIES/mL CANDIDA ALBICANS (A)  Final   Report Status 04/06/2019 FINAL  Final    Labs: Results for orders placed or performed during the hospital encounter of 03/30/2019 (from the past 48 hour(s))  Glucose, capillary     Status: Abnormal    Collection Time: 04/06/19 11:35 AM  Result Value Ref Range   Glucose-Capillary 146 (H) 70 - 99 mg/dL  Glucose, capillary     Status: Abnormal   Collection Time: 04/06/19  3:50 PM  Result Value Ref Range   Glucose-Capillary 130 (H) 70 - 99 mg/dL  Glucose, capillary     Status: Abnormal   Collection Time: 04/06/19  7:50 PM  Result Value Ref Range   Glucose-Capillary 114 (H) 70 - 99 mg/dL  Basic metabolic panel     Status: Abnormal   Collection Time: 04/06/19  8:48 PM  Result Value Ref Range   Sodium 151 (H) 135 - 145 mmol/L   Potassium 3.5 3.5 - 5.1 mmol/L   Chloride 126 (H) 98 - 111 mmol/L   CO2 21 (L) 22 - 32 mmol/L   Glucose, Bld 141 (H) 70 - 99 mg/dL   BUN 37 (H) 8 - 23 mg/dL   Creatinine, Ser 0.94 0.61 - 1.24 mg/dL   Calcium 8.2 (L) 8.9 - 10.3 mg/dL   GFR calc non Af Amer >60 >60 mL/min   GFR calc Af Amer >60 >60 mL/min   Anion gap 4 (L) 5 - 15    Comment: Performed at [facility], [address], [place], [place] [address]  Magnesium     Status: None   Collection Time: 04/06/19  8:48 PM  Result Value Ref Range   Magnesium 1.7 1.7 - 2.4 mg/dL    Comment: Performed at [facility], [place] [address] [place], [place] [address]  Glucose, capillary     Status: Abnormal   Collection Time: 04/07/19 12:18 AM  Result Value Ref Range   Glucose-Capillary 138 (H) 70 - 99 mg/dL   Comment 1 Notify RN    Comment 2 Document in Chart   Glucose, capillary     Status: Abnormal   Collection Time: 04/07/19  4:18 AM  Result Value Ref Range   Glucose-Capillary 149 (H) 70 - 99 mg/dL  Basic metabolic panel     Status: Abnormal   Collection Time: 04/07/19  4:40 AM  Result Value Ref Range   Sodium 149 (H) 135 - 145 mmol/L   Potassium 3.7 3.5 - 5.1 mmol/L   Chloride 123 (H) 98 - 111 mmol/L   CO2 20 (L) 22 - 32 mmol/L   Glucose, Bld 169 (H) 70 - 99 mg/dL   BUN 38 (H) 8 - 23 mg/dL   Creatinine, Ser 0.97 0.61 - 1.24 mg/dL   Calcium 8.1 (L) 8.9 - 10.3 mg/dL   GFR calc non Af Amer  >60 >60 mL/min   GFR calc Af Amer >60 >60 mL/min   Anion gap 6 5 - 15    Comment: Performed at [facility] [address], [place], [place] [address]  Prealbumin     Status: Abnormal   Collection Time: 04/07/19  4:40 AM  Result Value Ref Range   Prealbumin 8.1 (L) 18 - 38 mg/dL    Comment: Performed at [facility] [address], [place], [place] [address]  CBC     Status: Abnormal   Collection Time: 04/07/19  4:40  AM  Result Value Ref Range   WBC 32.6 (H) 4.0 - 10.5 K/uL   RBC 3.30 (L) 4.22 - 5.81 MIL/uL   Hemoglobin 9.2 (L) 13.0 - 17.0 g/dL   HCT 26.9 (L) 39.0 - 52.0 %   MCV 81.5 80.0 - 100.0 fL   MCH 27.9 26.0 - 34.0 pg   MCHC 34.2 30.0 - 36.0 g/dL   RDW 22.3 (H) 11.5 - 15.5 %   Platelets 293 150 - 400 K/uL   nRBC 0.0 0.0 - 0.2 %    Comment: Performed at [facility] [address], [place], [place] [address]  Magnesium     Status: None   Collection Time: 04/07/19  4:40 AM  Result Value Ref Range   Magnesium 2.0 1.7 - 2.4 mg/dL    Comment: Performed at [facility] [address] [place], [place] [address]  Glucose, capillary     Status: Abnormal   Collection Time: 04/07/19  7:40 AM  Result Value Ref Range   Glucose-Capillary 124 (H) 70 - 99 mg/dL  Glucose, capillary     Status: Abnormal   Collection Time: 04/07/19 11:32 AM  Result Value Ref Range   Glucose-Capillary 125 (H) 70 - 99 mg/dL  Glucose, capillary     Status: Abnormal   Collection Time: 04/07/19  3:15 PM  Result Value Ref Range   Glucose-Capillary 148 (H) 70 - 99 mg/dL  Glucose, capillary     Status: Abnormal   Collection Time: 04/07/19  7:55 PM  Result Value Ref Range   Glucose-Capillary 180 (H) 70 - 99 mg/dL  Glucose, capillary     Status: Abnormal   Collection Time: 04/08/19 12:45 AM  Result Value Ref Range   Glucose-Capillary 188 (H) 70 - 99 mg/dL  Glucose, capillary     Status: Abnormal   Collection Time: 04/08/19  3:21 AM  Result Value Ref Range   Glucose-Capillary  151 (H) 70 - 99 mg/dL  Basic metabolic panel     Status: Abnormal   Collection Time: 04/08/19  3:50 AM  Result Value Ref Range   Sodium 153 (H) 135 - 145 mmol/L   Potassium 3.0 (L) 3.5 - 5.1 mmol/L   Chloride 126 (H) 98 - 111 mmol/L   CO2 21 (L) 22 - 32 mmol/L   Glucose, Bld 188 (H) 70 - 99 mg/dL   BUN 45 (H) 8 - 23 mg/dL   Creatinine, Ser 0.98 0.61 - 1.24 mg/dL   Calcium 8.1 (L) 8.9 - 10.3 mg/dL   GFR calc non Af Amer >60 >60 mL/min   GFR calc Af Amer >60 >60 mL/min   Anion gap 6 5 - 15    Comment: Performed at [facility], [place] [address], [place], [place] [address]  CBC     Status: Abnormal   Collection Time: 04/08/19  3:50 AM  Result Value Ref Range   WBC 26.0 (H) 4.0 - 10.5 K/uL   RBC 2.99 (L) 4.22 - 5.81 MIL/uL   Hemoglobin 8.6 (L) 13.0 - 17.0 g/dL   HCT 24.7 (L) 39.0 - 52.0 %   MCV 82.6 80.0 - 100.0 fL   MCH 28.8 26.0 - 34.0 pg   MCHC 34.8 30.0 - 36.0 g/dL   RDW 23.3 (H) 11.5 - 15.5 %   Platelets 293 150 - 400 K/uL   nRBC 0.0 0.0 - 0.2 %    Comment: Performed at [facility],  [place] [address]  Glucose, capillary     Status: Abnormal   Collection Time: 04/08/19  7:36 AM  Result Value Ref Range   Glucose-Capillary 160 (H) 70 - 99 mg/dL    Imaging / Studies: Dg Chest Port 1 View  Result Date: 04/08/2019 CLINICAL DATA:  Respiratory failure EXAM: PORTABLE CHEST 1 VIEW COMPARISON:  Yesterday FINDINGS: Endotracheal tube tip between the clavicular heads and carina. Right subclavian line with tip at the upper cavoatrial junction. The enteric tube tip reaches the pyloric region. Hazy bilateral airspace opacity pleural fluid on the left at least. Normal heart size and mediastinal contours. No pneumothorax. IMPRESSION: 1. Stable hardware positioning. 2. Unchanged bilateral airspace disease with symmetry and small pleural effusions favoring pulmonary edema. Electronically Signed   By: Monte Fantasia M.D.   On: 04/08/2019 06:28   Dg Chest Port  1 View  Result Date: 04/07/2019 CLINICAL DATA:  Respiratory failure  ET tube EXAM: PORTABLE CHEST 1 VIEW COMPARISON:  04/06/2019 FINDINGS: Central venous line and NG tube unchanged. Endotracheal tube unchanged in position. There is bilateral patchy airspace disease which is slightly less dense than prior. Small LEFT effusion. IMPRESSION: 1. Stable support apparatus. 2. Mild improvement in diffuse airspace disease. Electronically Signed   By: Suzy Bouchard M.D.   On: 04/07/2019 07:09    Medications / Allergies: per chart  Antibiotics: Anti-infectives (From admission, onward)   Start     Dose/Rate Route Frequency Ordered Stop   04/05/19 1200  ceFEPIme (MAXIPIME) 2 g in sodium chloride 0.9 % 100 mL IVPB     2 g 200 mL/hr over 30 Minutes Intravenous Every 12 hours 04/05/19 1131 04/15/19 0959   03/30/19 0600  cefoTEtan (CEFOTAN) 2 g in sodium chloride 0.9 % 100 mL IVPB    Note to Pharmacy: Pharmacy may adjust dose strength for optimal dosing.   Send with patient on call to the OR.  Anesthesia to complete antibiotic administration <88mn [facility]   2 g 200 mL/hr over 30 Minutes Intravenous To Short Stay 04/19/2019 1335 04/21/2019 1559   03/31/2019 2200  cefoTEtan (CEFOTAN) 2 g in sodium chloride 0.9 % 100 mL IVPB     2 g 200 mL/hr over 30 Minutes Intravenous Every 12 hours 03/30/2019 1902 04/08/2019 2214   04/21/2019 1730  vancomycin (VANCOCIN) 1,250 mg in sodium chloride 0.9 % 250 mL IVPB  Status:  Discontinued     1,250 mg 166.7 mL/hr over 90 Minutes Intravenous Every 24 hours 04/21/2019 1720 04/23/2019 0928   04/21/2019 1400  neomycin (MYCIFRADIN) tablet 1,000 mg  Status:  Discontinued     1,000 mg Oral 3 times per day 04/16/2019 0620 03/30/2019 0839   04/13/2019 1400  metroNIDAZOLE (FLAGYL) tablet 1,000 mg  Status:  Discontinued     1,000 mg Oral 3 times per day 04/05/2019 0620 04/22/2019 0839   04/23/2019 1013  vancomycin variable dose per unstable renal function (pharmacist dosing)   Status:  Discontinued      Does not apply See admin instructions 04/24/2019 1014 04/16/2019 1908   03/31/2019 0930  metroNIDAZOLE (FLAGYL) IVPB 500 mg  Status:  Discontinued     500 mg 100 mL/hr over 60 Minutes Intravenous Every 8 hours 04/21/2019 0920 04/01/19 1054   04/05/2019 0630  cefoTEtan (CEFOTAN) 2 g in sodium chloride 0.9 % 100 mL IVPB  Status:  Discontinued     2 g 200 mL/hr over 30 Minutes Intravenous On call to O.R. 04/13/2019 [id number]/02/20 [id number]  04/10/2019 0630  clindamycin (CLEOCIN) 900 mg, gentamicin (GARAMYCIN) 240 mg in sodium chloride 0.9 % 1,000 mL for intraperitoneal lavage  Status:  Discontinued      Irrigation To Surgery 04/05/2019 0620 04/10/2019 0840   03/30/2019 0500  ceFEPIme (MAXIPIME) 2 g in sodium chloride 0.9 % 100 mL IVPB  Status:  Discontinued     2 g 200 mL/hr over 30 Minutes Intravenous Every 12 hours 04/08/2019 1720 04/04/19 0459   04/15/2019 1630  ceFEPIme (MAXIPIME) 2 g in sodium chloride 0.9 % 100 mL IVPB     2 g 200 mL/hr over 30 Minutes Intravenous  Once 04/24/2019 1617 04/11/2019 1804   04/10/2019 1630  metroNIDAZOLE (FLAGYL) IVPB 500 mg     500 mg 100 mL/hr over 60 Minutes Intravenous  Once 04/03/2019 1617 04/18/2019 1928   04/03/2019 1630  vancomycin (VANCOCIN) IVPB 1000 mg/200 mL premix  Status:  Discontinued     1,000 mg 200 mL/hr over 60 Minutes Intravenous  Once 04/11/2019 1617 04/01/2019 1626   04/18/2019 1630  vancomycin (VANCOCIN) 1,500 mg in sodium chloride 0.9 % 500 mL IVPB     1,500 mg 250 mL/hr over 120 Minutes Intravenous  Once 04/24/2019 1626 04/17/2019 2157        Note: Portions of this report may have been transcribed using voice recognition software. Every effort was made to ensure accuracy; however, inadvertent computerized transcription errors may be present.   Any transcriptional errors that result from this process are unintentional.     Adin Hector, MD, FACS, MASCRS Gastrointestinal and Minimally Invasive Surgery    [address], [place]  [place], [place] [address] [phone] Main / Paging [phone] Fax

## 2019-04-08 NOTE — Progress Notes (Signed)
  Echocardiogram 2D Echocardiogram has been performed.  Darryl Johns M 04/08/2019, 9:22 AM

## 2019-04-08 NOTE — Progress Notes (Signed)
[facility] ADULT ICU REPLACEMENT PROTOCOL FOR AM LAB REPLACEMENT ONLY  The patient does apply for the [facility] Adult ICU Electrolyte Replacment Protocol based on the criteria listed below:   1. Is GFR >/= 40 ml/min? Yes.    Patient's GFR today is >60 2. Is urine output >/= 0.5 ml/kg/hr for the last 6 hours? Yes.   Patient's UOP is 0.78 ml/kg/hr 3. Is BUN < 60 mg/dL? Yes.    Patient's BUN today is 45 4. Abnormal electrolyte K 3.0 5. Ordered repletion with: per protocol 6. If a panic level lab has been reported, has the CCM MD in charge been notified? Yes.  .   Physician:  Dr Luvenia Redden 04/08/2019 6:19 AM

## 2019-04-08 NOTE — Progress Notes (Addendum)
ANTICOAGULATION CONSULT NOTE  Pharmacy Consult for heparin Indication: atrial fibrillation  Heparin Dosing Weight: 77.2 kg  Labs: Recent Labs    04/06/19 0448 04/06/19 2048 04/07/19 0440 04/08/19 0350 04/08/19 1700  HGB 9.7*  --  9.2* 8.6*  --   HCT 28.4*  --  26.9* 24.7*  --   PLT 268  --  293 293  --   HEPARINUNFRC  --   --   --   --  0.16*  CREATININE 1.02 0.94 0.97 0.98  --     Assessment: 84 yr old male with new a fib. Pharmacy was consulted to start heparin drip. Pt was not on anticoagulation PTA. Hgb drifting down to 8.6, platelets WNL, last INR 1.7 on 10/8. Noted colon cancer, SB volvulus, GI bleed S/P laparotomy, but no current active bleeding  issues. Ok to start heparin per Dr. Halford Chessman with no bolus.  Heparin level drawn ~8 hrs after initiation of heparin infusion at 1100 units/hr was 0.16 units/ml, which is below the goal range for this patient. Per RN, no issues with IV or bleeding observed.  Goal of Therapy:  Heparin level 0.3-0.7 units/ml Monitor platelets by anticoagulation protocol: Yes   Plan:  Increase heparin infusion to 1300 units/hr Check 8-hr heparin level Daily heparin level, CBC Monitor for signs/symptoms of bleeding  Gillermina Hu, PharmD, BCPS, [facility] Clinical Pharmacist 04/08/2019 6:28 PM

## 2019-04-09 ENCOUNTER — Other Ambulatory Visit (HOSPITAL_COMMUNITY): Payer: Medicare HMO

## 2019-04-09 DIAGNOSIS — Z932 Ileostomy status: Secondary | ICD-10-CM

## 2019-04-09 DIAGNOSIS — I519 Heart disease, unspecified: Secondary | ICD-10-CM

## 2019-04-09 DIAGNOSIS — I5189 Other ill-defined heart diseases: Secondary | ICD-10-CM

## 2019-04-09 DIAGNOSIS — R198 Other specified symptoms and signs involving the digestive system and abdomen: Secondary | ICD-10-CM

## 2019-04-09 DIAGNOSIS — D500 Iron deficiency anemia secondary to blood loss (chronic): Secondary | ICD-10-CM

## 2019-04-09 DIAGNOSIS — D638 Anemia in other chronic diseases classified elsewhere: Secondary | ICD-10-CM

## 2019-04-09 LAB — GLUCOSE, CAPILLARY
Glucose-Capillary: 162 mg/dL — ABNORMAL HIGH (ref 70–99)
Glucose-Capillary: 149 mg/dL — ABNORMAL HIGH (ref 70–99)
Glucose-Capillary: 212 mg/dL — ABNORMAL HIGH (ref 70–99)
Glucose-Capillary: 170 mg/dL — ABNORMAL HIGH (ref 70–99)
Glucose-Capillary: 178 mg/dL — ABNORMAL HIGH (ref 70–99)

## 2019-04-09 LAB — BASIC METABOLIC PANEL
Anion gap: 7 (ref 5–15)
Anion gap: 7 (ref 5–15)
BUN: 54 mg/dL — ABNORMAL HIGH (ref 8–23)
BUN: 58 mg/dL — ABNORMAL HIGH (ref 8–23)
CO2: 20 mmol/L — ABNORMAL LOW (ref 22–32)
CO2: 17 mmol/L — ABNORMAL LOW (ref 22–32)
Calcium: 8.2 mg/dL — ABNORMAL LOW (ref 8.9–10.3)
Calcium: 8 mg/dL — ABNORMAL LOW (ref 8.9–10.3)
Chloride: 124 mmol/L — ABNORMAL HIGH (ref 98–111)
Chloride: 123 mmol/L — ABNORMAL HIGH (ref 98–111)
Creatinine, Ser: 1.29 mg/dL — ABNORMAL HIGH (ref 0.61–1.24)
Creatinine, Ser: 1.38 mg/dL — ABNORMAL HIGH (ref 0.61–1.24)
GFR calc Af Amer: 59 mL/min — ABNORMAL LOW (ref >60)
GFR calc Af Amer: 55 mL/min — ABNORMAL LOW (ref >60)
GFR calc non Af Amer: 51 mL/min — ABNORMAL LOW (ref >60)
GFR calc non Af Amer: 47 mL/min — ABNORMAL LOW (ref >60)
Glucose, Bld: 180 mg/dL — ABNORMAL HIGH (ref 70–99)
Glucose, Bld: 243 mg/dL — ABNORMAL HIGH (ref 70–99)
Potassium: 2.8 mmol/L — ABNORMAL LOW (ref 3.5–5.1)
Potassium: 3.5 mmol/L (ref 3.5–5.1)
Sodium: 151 mmol/L — ABNORMAL HIGH (ref 135–145)
Sodium: 147 mmol/L — ABNORMAL HIGH (ref 135–145)

## 2019-04-09 LAB — MAGNESIUM: Magnesium: 1.9 mg/dL (ref 1.7–2.4)

## 2019-04-09 LAB — HEPARIN LEVEL (UNFRACTIONATED)
Heparin Unfractionated: 0.2 IU/mL — ABNORMAL LOW (ref 0.30–0.70)
Heparin Unfractionated: 0.35 IU/mL (ref 0.30–0.70)

## 2019-04-09 MED ORDER — GABAPENTIN 250 MG/5ML PO SOLN
200.0000 mg | Freq: Three times a day (TID) | ORAL | Status: DC
  Administered 2019-04-09 – 2019-04-10 (×4): 200 mg
  Filled 2019-04-09 (×7): qty 4

## 2019-04-09 MED ORDER — LOPERAMIDE HCL 1 MG/7.5ML PO SUSP
2.0000 mg | Freq: Two times a day (BID) | ORAL | Status: DC
  Administered 2019-04-09 – 2019-04-10 (×3): 2 mg
  Filled 2019-04-09 (×3): qty 15

## 2019-04-09 MED ORDER — POTASSIUM CHLORIDE 20 MEQ/15ML (10%) PO SOLN
30.0000 meq | Freq: Two times a day (BID) | ORAL | Status: AC
  Administered 2019-04-10 (×2): 30 meq
  Filled 2019-04-09 (×2): qty 30

## 2019-04-09 MED ORDER — POTASSIUM CHLORIDE 20 MEQ/15ML (10%) PO SOLN
40.0000 meq | ORAL | Status: AC
  Administered 2019-04-09 (×2): 40 meq
  Filled 2019-04-09 (×2): qty 30

## 2019-04-09 MED ORDER — FERROUS SULFATE 300 (60 FE) MG/5ML PO SYRP
60.0000 mg | ORAL_SOLUTION | Freq: Two times a day (BID) | ORAL | Status: DC
  Administered 2019-04-09 – 2019-04-11 (×6): 60 mg
  Filled 2019-04-09 (×7): qty 5

## 2019-04-09 MED ORDER — PANTOPRAZOLE SODIUM 40 MG PO PACK
40.0000 mg | PACK | Freq: Every day | ORAL | Status: DC
  Administered 2019-04-09 – 2019-04-10 (×2): 40 mg
  Filled 2019-04-09 (×2): qty 20

## 2019-04-09 NOTE — Progress Notes (Addendum)
Spoke with pt's wife over the phone and updated about current status.  Explained difficulty with vent weaning, new onset a fib, generalized weakness and deconditioning, and needing more time to recover from surgery.  Explained that he will likely need a prolonged recovery time, and might need tracheostomy.  Mr. Langston sister, Armanda Heritage [id number]), is in health care and his wife asked we also speak with her to discuss treatment plans.  Chesley Mires, MD [facility] Pulmonary/Critical Care 04/09/2019, 11:30 AM   Spoke with Gaspar Skeeters.  Updated her about events that have lead up to current status for Mr. [place]. She understands current situation and will speak with her sister in law about whether she would be agreeable to do tracheostomy.  Chesley Mires, MD [facility] Pulmonary/Critical Care 04/09/2019, 2:12 PM

## 2019-04-09 NOTE — Progress Notes (Signed)
[facility] ADULT ICU REPLACEMENT PROTOCOL FOR AM LAB REPLACEMENT ONLY  The patient does apply for the [facility] Adult ICU Electrolyte Replacment Protocol based on the criteria listed below:   1. Is GFR >/= 40 ml/min? Yes.    Patient's GFR today is 54 2. Is urine output >/= 0.5 ml/kg/hr for the last 6 hours? Yes.   Patient's UOP is 1.11 ml/kg/hr 3. Is BUN < 60 mg/dL? Yes.    Patient's BUN today is 54 4. Abnormal electrolyte  K 2.8 5. Ordered repletion with: protocol 6. If a panic level lab has been reported, has the CCM MD in charge been notified? Yes.  .   Physician:  Vernell Barrier 04/09/2019 5:48 AM

## 2019-04-09 NOTE — Progress Notes (Signed)
NAME:  Darryl Johns, MRN:  [id number], DOB:  [birth year], LOS: 43 ADMISSION DATE:  04/08/2019, CONSULTATION DATE:   REFERRING MD: Velna Ochs MD, CHIEF COMPLAINT:  GI bleed  Brief History   84 yo male former smoker presented to ER with altered mental status after falling.  Found to have hematemesis and melena with hemorrhagic shock.  CT imaging showed transverse colon mass and SB volvulus.  Required ventilatory support and pressors.  Past Medical History  RBBB, Sensorimotor axonal peripheral neuropathy, HTN, allergies, colon mass, Sjogren's syndrome, Emphysema  Significant Hospital Events   10/02 laparotomy with Rt colectomy and end ileostomy, jejunal SB resection with anastomosis, wedge bx of liver mass, application of wound vac over closed fascia  10/06 off pressors 10/08 extubated >> reintubated with concern for airway protection 10/12 A fib with RVR, start heparin gtt  Consults:  GI Surgery  Procedures:  ETT 10/2 >> 10/8 Rt [place] CVL 10/2 >> ETT 10/8 >>  Significant Diagnostic Tests:  CTA 10/1 >> no PE, right lower lobe consolidation.  1.4 cm left upper lobe nodule.  Emphysema, aortic, coronary atherosclerosis. CT head / neck 10/1 >> chronic ischemic changes.  No acute abnormalities. Echo 10/12 >> EF 60 to 65%, mild MR/TR, grade 1 DD  Micro Data:  COVID 10/1 >> negative  Tracheal aspirate 10/4 >> Predom PMN, Rare yeast BCx2 10/1 >> negative BAL 10/8 >> Candida albicans  Antimicrobials:  Cefepime 10/1 >> 10/7 Cefepime 10/9 >> 10/12  Interim history/subjective:  Didn't tolerate SBT >> tachypnea, low Vt, tachycardia.  Objective   Blood pressure 108/63, pulse (!) 104, temperature 98.6 F (37 C), temperature source Oral, resp. rate (!) 29, height _0  (1.88 m), weight 73.3 kg, SpO2 100 %.    Vent Mode: PRVC FiO2 (%):  [40 %] 40 % Set Rate:  [24 bmp] 24 bmp Vt Set:  [570 mL] 570 mL PEEP:  [5 cmH20] 5 cmH20 Pressure Support:  [5 cmH20-10 cmH20] 10 cmH20 Plateau  Pressure:  [17 cmH20-26 cmH20] 22 cmH20   Intake/Output Summary (Last 24 hours) at 04/09/2019 0841 Last data filed at 04/09/2019 0800 Gross per 24 hour  Intake 4245.84 ml  Output 2255 ml  Net 1990.84 ml   Filed Weights   04/08/19 0357 04/08/19 0800 04/09/19 0407  Weight: 77.2 kg 77.2 kg 73.3 kg    Examination:.  General - sedated Eyes - pupils reactive ENT - ETT in place Cardiac - irregular Chest - b/l rhonchi Abdomen - wound vac in place Extremities - 1+ edema Skin - no rashes Neuro - follows simple commands, moves extremities, weak  Resolved Hospital Problem list   Lactic Acidosis, Hemorrhagic shock, Acute GI bleed, Elevated ammonia in setting of GI bleed, HCAP  Assessment & Plan:    Acute hypoxic respiratory failure. Emphysema. Interstitial edema. - not able to tolerate pressure support weaning - concerned he is too weak for extubation trial at this time - will need to d/w family about whether they would be agreeable to tracheostomy - prn BDs - f/u CXR intermittently  New onset A fib 10/12. - continue lopressor, heparin gtt  Lt upper lobe nodule. - will need assessment as outpt  Colon cancer, SB volvulus, GI bleed s/p laparotomy. Severe protein calorie malnutrition. - post op care per surgery  Hypernatremia. - continue D5W at 50 ml/hr and free water - f/u BMET  Hypokalemia. - f/u BMET after replacement 10/13  Anemia of critical illness. - f/u CBC - transfuse for Hb < 7  or significant bleeding  Acute metabolic encephalopathy. Recent w/u for peripheral neuropathy with concern for Sjogren's syndrome. - RASS goal 0 to -1 - continue neurontin   Best practice:  Diet: tube feeds DVT prophylaxis: SCDs GI prophylaxis: protonix Mobility: Bed Code Status: Full Code   Disposition: ICU  Labs    CMP Latest Ref Rng & Units 04/09/2019 04/08/2019 04/07/2019  Glucose 70 - 99 mg/dL 180(H) 188(H) 169(H)  BUN 8 - 23 mg/dL 54(H) 45(H) 38(H)  Creatinine  0.61 - 1.24 mg/dL 1.29(H) 0.98 0.97  Sodium 135 - 145 mmol/L 151(H) 153(H) 149(H)  Potassium 3.5 - 5.1 mmol/L 2.8(L) 3.0(L) 3.7  Chloride 98 - 111 mmol/L 124(H) 126(H) 123(H)  CO2 22 - 32 mmol/L 20(L) 21(L) 20(L)  Calcium 8.9 - 10.3 mg/dL 8.2(L) 8.1(L) 8.1(L)  Total Protein 6.5 - 8.1 g/dL - - -  Total Bilirubin 0.3 - 1.2 mg/dL - - -  Alkaline Phos 38 - 126 U/L - - -  AST 15 - 41 U/L - - -  ALT 0 - 44 U/L - - -   CBC Latest Ref Rng & Units 04/08/2019 04/07/2019 04/06/2019  WBC 4.0 - 10.5 K/uL 26.0(H) 32.6(H) 37.4(H)  Hemoglobin 13.0 - 17.0 g/dL 8.6(L) 9.2(L) 9.7(L)  Hematocrit 39.0 - 52.0 % 24.7(L) 26.9(L) 28.4(L)  Platelets 150 - 400 K/uL 293 293 268   ABG    Component Value Date/Time   PHART 7.391 04/04/2019 2214   PCO2ART 33.3 04/04/2019 2214   PO2ART 66.0 (L) 04/04/2019 2214   HCO3 20.0 04/04/2019 2214   TCO2 21 (L) 04/04/2019 2214   ACIDBASEDEF 4.0 (H) 04/04/2019 2214   O2SAT 92.0 04/04/2019 2214   CBG (last 3)  Recent Labs    04/08/19 2328 04/09/19 0351 04/09/19 0716  GLUCAP 178* 162* 149*   CC time 32 minutes  Chesley Mires, MD [facility] Pulmonary/Critical Care 04/09/2019, 8:41 AM

## 2019-04-09 NOTE — Progress Notes (Signed)
[facility] for heparin Indication: atrial fibrillation  Heparin Dosing Weight: 77.2 kg  Labs: Recent Labs    04/06/19 0448 04/06/19 2048 04/07/19 0440 04/08/19 0350 04/08/19 1700 04/09/19 0255  HGB 9.7*  --  9.2* 8.6*  --   --   HCT 28.4*  --  26.9* 24.7*  --   --   PLT 268  --  293 293  --   --   HEPARINUNFRC  --   --   --   --  0.16* 0.20*  CREATININE 1.02 0.94 0.97 0.98  --   --     Assessment: 84 y.o. male with Afib for heparin  Goal of Therapy:  Heparin level 0.3-0.7 units/ml Monitor platelets by anticoagulation protocol: Yes   Plan:  Increase Heparin 1500 units/hr Check heparin level in 8 hours.   Phillis Knack, PharmD, BCPS 04/09/2019 3:43 AM

## 2019-04-09 NOTE — Progress Notes (Signed)
RT note: patient had been placed on CPAP/PSV of 10/5 by MD at [facility], per RN.  Upon arrival patient was breathing 31 times per minute.  Patient noted to be rhonchus.  Suctioned patient and obtained a copious amount of thick, yellow secretions.  Patient's HR increased to 120s.  Patient also became more tachypneic.  Patient placed back on full support ventilation and RN gave patient a bolus of medication.  Unit RT made aware.  Will continue to monitor.

## 2019-04-09 NOTE — Progress Notes (Addendum)
Darryl Johns [id number] August 29, 1934  CARE TEAM:  PCP: Kristie Cowman, MD  Outpatient Care Team: Patient Care Team: Kristie Cowman, MD as PCP - General (Family Medicine) Debara Pickett Nadean Corwin, MD as Consulting Physician (Cardiology) Michael Boston, MD as Consulting Physician (Colon and Rectal Surgery) Coral Spikes, MD as Consulting Physician (Gastroenterology) Kathrynn Ducking, MD as Consulting Physician (Neurology)  Inpatient Treatment Team: Treatment Team: Attending Provider: Chesley Mires, MD; Rounding Team: Pccm, Md, MD; Registered Nurse: Tenna Child, RN; Consulting Physician: Edison Pace, Md, MD; Consulting Physician: Michael Boston, MD; Registered Nurse: Jackalyn Lombard, RN; Registered Nurse: Romona Curls, RN; Registered Nurse: Charmayne Sheer, RN; Utilization Review: Tressie Stalker, RN   Problem List:   Principal Problem:   Acute ischemia of jejunum s/p SB resection 04/26/2019 Active Problems:   Cancer of transverse colon pT4b, 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   Protein-calorie malnutrition, severe   Lung mass - primary vs colon metastasis   Essential hypertension   RBBB   Dizziness   Hypotension   Peripheral neuropathy   Gait abnormality   Sepsis ([place])   HOH (hard of hearing)   Coffee ground emesis   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[place])   Shock ([place])   Cachexia ([place])   Elevated carcinoembryonic antigen (CEA)   Recent unintentional weight loss over several months   Hemorrhagic shock ([place])   AKI (acute kidney injury) ([place])   Emphysema of lung ([place])   Coagulopathy ([place])   Colon bleed requiring more than 4 units of blood in 24 hours, ICU, or surgery   Generalized anxiety disorder   Acute respiratory distress   Aspiration into airway   Acute respiratory failure with hypoxia ([place])   Pressure injury of skin   Left ventricular diastolic dysfunction with preserved systolic function   Anemia due to GI blood loss   Anemia of chronic disease   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  Suspicious for possible microperforation but no feculent contamination.  Moderate ascites.  Proximal jejunum just distal ligament of Treitz x2 feet twisted around to the colon tumor and trapped by omentum causing a volvulus and closed-loop type obstruction.  Distal jejunum with edema.  Ileum without inflammation. Patient has a side-to-side staple anastomosis of the very proximal jejunum at the ligament of Treitz to mid jejunum isoperistaltic.  Proximal colon including tumor excised en bloc with proximal jejunum.  End ileostomy in right supraumbilical paramedian region.  Prolene sutures on the proximal Hartmann staple line at the distal transverse colon near the splenic flexure.  Hard fibrotic 5 mm nodule on the dome of the right liver.  Wedge biopsy done.  No other evidence of any intraperitoneal carcinomatosis.  PATHOLOGY:  TRANSVERSE COLON CANCER mpT4b, pN1a   A. COLON JEJUNUM, RIGHT PROXIMAL, RESECTION:  - Right hemicolectomy specimen showing 2 separate tumors  1) Invasive poorly differentiated adenocarcinoma, 12 cm, involving  transverse colon, with marked necrosis  - Carcinoma invades through the serosa into adherent jejunum  - Lymphovascular invasion is present  2) Invasive moderately differentiated adenocarcinoma, 1.6 cm, involving  transverse colon (2.8 cm proximal to the larger tumor)  - Carcinoma invades into pericolonic soft tissue  -  Lymphovascular invasion is present   - Resection margins are negative for carcinoma  - Metastatic carcinoma to one of thirty-six lymph nodes (1/36). See  comment  - Unremarkable appendix  - See oncology table   B. LIVER, NODULE, WEDGE:  - Benign liver parenchyma showing a fibrotic nodule with calcification  - Negative for ova, parasites,granulomas or malignancy   Assessment  Guarded but stabilizing  [facility] Stay = 12 days)  Plan:  Tube feeds at goal.  Continue  FTT.  Suspect he will need trach/PEG to transition off vent & retry PO with expected poor PO effort.  Maintain full enteral nutrition  Minimal vent settings.  Weaning parameters for critical care.  Weaning trial OK this AM - ?maybe extubate.  I think he would benefit from diuresing.  I agree with critical care is concerns that he may require tracheostomy to gradually wean with trach collar trials  Good urine output and normal creatinine argues against renal failure.  Consider more aggressive diuresis - follow Na.  Omeprazole per NGT.  Consider just daily being off pressors, etc  Low K, Phos  & Mg - replacing as needed.  K low w diuresis.  Schedule K for a while & follow  HyperNa - prob w fluid shifts - per CCM.  Guarded against free water replacement setting of being fluid overloaded, but understands worsening hypernatremia and even bigger concern  Ileostomy care.  Usually patients in the long-term developed high output ileostomy requiring a antidiarrheal regimen of psyllium, iron, & as needed Imodium or Lomotil.  Will start w loperamide BID & follow.  Add iron.  We will have to see how this plays out.  Sometimes people need a PICC line and IV fluids for 6 weeks to avoid dehydration.  We will see  Anemia - acute GI losses & chronic disease.  Add enteral iron.  Will help w high output ileostomy as well.  Wound VAC is on the SQ.  Change Monday Wednesday Friday.  Can switch to packing if closed down.  Initially request  to get a packing only over the weekend but now they wish to continue it this week.  Pathology:  Adenocarcinoma of transverse colon invading into the jejunum.  Stage III.  Ideally would benefit from post adjuvant chemotherapy.  Suspect with his advanced age he would just get post-adjuvant oral capecitabine/Xeloda.  Clearly we need to wait to see how he will do with the emergency surgery.  Dr. Burr Medico aware and will follow up with him as an outpatient.  No need to continue IV antibiotics from a surgical standpoint.  He did not have frank severe peritonitis and is been 72 hours since surgery.  Defer to critical care/medicine if they feel he needs it for pulmonary or other concerns.    35 minutes spent in review, evaluation, examination, counseling, and coordination of care.  More than 50% of that time was spent in counseling.     04/09/2019    Subjective: (Chief complaint)  Minimal vent settings.  Trying weaning.  Some mild secretions in the ET tube.  Patient more alert.  Less tachycardic when sedation held back.  Objective:  Vital signs:  Vitals:   04/09/19 0400 04/09/19 0407 04/09/19 0500 04/09/19 0600  BP: (!) 113/48  (!) 107/47 (!) 105/48  Pulse: 92  93 87  Resp: (!) 28  (!) 24 (!) 24  Temp:      TempSrc:      SpO2: 99%  99% 99%  Weight:  73.3 kg    Height:        Last BM Date: 04/09/19  Intake/Output   Yesterday:  10/12 0701 - 10/13 0700 In: 4168.4 [I.V.:1693.3; NG/GT:2275; IV Piggyback:200.1] Out: 2480 [Urine:1130; Stool:1350] This shift:  No intake/output data recorded.  Bowel function:  Flatus: YES  BM:  YES  Drain: (No drain)   Physical Exam:  General: Pt awakens in no acute distress.  Moderate anasarca Eyes: PERRL, normal EOM.  Sclera clear.  No icterus Neuro: CN II-XII intact w/o focal sensory/motor deficits. Lymph: No head/neck/groin lymphadenopathy Psych:  No delerium/psychosis/paranoia HENT: Normocephalic, Mucus membranes moist.  No thrush.   NG  tube in place.  ET tube in place Neck: Supple, No tracheal deviation Chest: No chest wall pain w good excursion CV:  Pulses intact.  Regular rhythm MS: Normal AROM mjr joints.  No obvious deformity  Abdomen: Soft.  Nondistended.  Nontender.  Ileostomy right upper quadrant pink and edematous. Thin effluent in bag Wound VAC in midline incision clean.  No evidence of peritonitis.  No incarcerated hernias.  Ext:  2-3+ edema.  No deformity.  No mjr edema.  No cyanosis Skin: No petechiae / purpura  Results:   PATHOLOGY DIAGNOSIS:   A. COLON JEJUNUM, RIGHT PROXIMAL, RESECTION:  - Right hemicolectomy specimen showing 2 separate tumors  1) Invasive poorly differentiated adenocarcinoma, 12 cm, involving  transverse colon, with marked necrosis  - Carcinoma invades through the serosa into adherent jejunum  - Lymphovascular invasion is present   2) Invasive moderately differentiated adenocarcinoma, 1.6 cm, involving  transverse colon (2.8 cm proximal to the larger tumor)  - Carcinoma invades into pericolonic soft tissue  - Lymphovascular invasion is present   - Resection margins are negative for carcinoma  - Metastatic carcinoma to one of thirty-six lymph nodes (1/36). See  comment  - Unremarkable appendix  - See oncology table   B. LIVER, NODULE, WEDGE:  - Benign liver parenchyma showing a fibrotic nodule with calcification  - Negative for ova, parasites,granulomas or malignancy    COMMENT:   The histomorphology of metastatic carcinoma in the lymph node is  suggestive of metastasis from the smaller, moderately differentiated  adenocarcinoma.    ONCOLOGY TABLE:   COLON AND RECTUM: Resection, Including Transanal Disk Excision of  Rectal Neoplasms   Procedure: Resection, partial right colon and proximal jejunum  Tumor Site: Transverse colon  Tumor Size: 12.0 cm  Macroscopic Tumor Perforation: Not identified  Histologic Type: Adenocarcinoma  Histologic Grade: G3: Poorly  differentiated  Tumor Extension: Tumor invades through serosa into adherent jejunum  Margins: Uninvolved by tumor  Treatment Effect: No known presurgical therapy  Lymphovascular Invasion: Present  Perineural Invasion: Not identified  Tumor Deposits: Not identified  Regional Lymph Nodes:    Number of Lymph Nodes Involved: 1    Number of Lymph Nodes Examined: 36  Pathologic Stage Classification (pTNM, AJCC 8th Edition): mpT4b, pN1a  Ancillary Studies: MMR / MSItesting will be ordered.  Representative Tumor Block: A8  Comments: A separate, invasive moderately differential adenocarcinoma  measuring 1.6 cm was identified 2.8 cm proximal to the larger tumor   (v4.1.0.0)      Odis Wickey DESCRIPTION:   A: Specimen: Received fresh labeled right colon and proximal jejunum  Specimen integrity: Specimen consists of an unoriented segment of small  intestine with 2  stapled resection margins, and attached portion of  colon (including terminal ileum, right colon, ascending, hepatic flexure  and portion of transverse colon)  Specimen length: Separate segment of jejunum measures 57.5 cm in length,  and the colon specimen displays 3.0 cm of terminal ileum and 45 cm of  colon.  Mesorectal intactness: Nonapplicable  Tumor location: Within the transverse colon, and extending through the  wall into the adjacent adherent portion of jejunum. Possible second  lesion identified, see mucosal polyp heading.  Tumor size: There is a 12.0 x 8.1 cm tan-gray, firm, circumferential  mass within the distal portion of the colon. The lesion appears to  invade through the wall, and extends into the lumen of the attached  additional portion of jejunum. Adipose tissue surrounding the jejunum  is indurated and slightly nodular.  Percent of bowel circumference involved: 100% of colon circumference,  and approximately 50% of small bowel circumference  Tumor distance to margins:            Proximal:  28.0 cm to the terminal ileum resection  margin            Distal: 5.3 cm from the distal colon resection  margin            Mesenteric (sigmoid and transverse): The serosa and  adipose tissue underlying the bulky colon tumor is puckered and  indurated. The indurated tissue measures approximately 1.9 cm from the  presumed mesenteric margin.            Radial (posterior ascending, posterior descending;  lateral and posterior mid-rectum; and entire lower 1/3 rectum):  Nonapplicable  Macroscopic extent of tumor invasion: Sectioning through the flattened  polyp proximal to the primary lesion reveals a tan-white solid cut  surface, which grossly extends into the muscularis propria and possible  adipose tissue. Sectioning through the primary colon lesion reveals a  tan-white to red cut surface, invading through the muscularis propria  into the surrounding adipose tissue.  Total presumed lymph nodes: 36 tan-gray possible lymph nodes are  identified, ranging from 0.2 cm to 1.4 x 1.0 0.7 cm.  Extramural satellite tumor nodules: None identified  Mucosal polyp(s): 2.8 cm proximal to the main colon lesion there is a  1.6 x 1.3 x 0.2 cm tan-brown, firm, flattened possible secondary lesion  identified. No additional polyps are identified.  Additional findings: Tumor invading into the jejunum measures 23.5 cm to  the closest margin and 33.0 cm from the opposing small bowel margin.  The segment of jejunum is partially twisted around the indurated adipose  tissue. The uninvolved colonic mucosa is tan-gray with normal folding.  The appendix is present, measures 6.0 cm in length and up to 0.9 cm in  diameter. The serosal surface is tan-gray and smooth, the mucosa is  tan, the wall measures up to 0.2 cm in thickness, the lumen is  moderately dilated and filled with an abundant amount of tan-yellow  fecal material. The indurated and puckered serosa and adipose tissue    underlying the bulky tumor is inked black. The mucosa of the uninvolved  small bowel is tan-gray, slightly dusky and focally edematous.  Block summary:  1 = terminal ileum resection margin  2 = distal colon resection margin  3 = jejunal margin closest to lesion  4 = jejunal margin distal to lesion  5-7 = smaller, secondary colon lesion  8 and 9 = cross-section from colon to jejunum  10-14 = representative sections of primary lesion  15 =  grossly uninvolved mucosa  16 = appendix  17 and 18 = 5 possible lymph nodes, each  19 and 20 = 4 possible lymph nodes, each  21-23 = 3 possible lymph nodes, each  24 and 25 = 2 possible lymph nodes, each  26 = 2 possible lymph nodes, differentially inked and bisected  27-29 = 1 possible lymph node bisected, each  30 = tissue for molecular studies   B: The specimen is received fresh and consists of a 1.6 x 1.6 x 0.5 cm  wedge portion of tan-pink, nodular soft tissue. The cut surface  displays a 0.3 cm in greatest dimension tan-white, firm, calcified  nodule. The specimen is bisected and entirely submitted in 1 cassette  following decalcification.      Final Diagnosis performed by Jaquita Folds, MD.  Electronically  signed 04/01/2019  Technical and / or Professional components performed at [facility]. [facility], [place] [address] [place], [place] [address].  Immunohistochemistry Technical component (if applicable) was performed  at [facility]. [address], [place],  [place], [place] [address].  IMMUNOHISTOCHEMISTRY DISCLAIMER (if applicable):  Some of these immunohistochemical stains may have been developed and the  performance characteristics determine by [facility]. Some  may not have been cleared or approved by the U.S. Food and Drug  Administration. The FDA has determined that such clearance or approval  is not necessary. This test is used for clinical purposes. It should not  be  regarded as investigational or for research. This laboratory is  certified under the [place]  (CLIA-88) as qualified to perform high complexity clinical laboratory  testing. The controls stained appropriately.   Cultures: Recent Results (from the past 720 hour(s))  SARS Coronavirus 2 [facility] order, Performed in [facility] hospital lab) Nasopharyngeal Nasopharyngeal Swab     Status: None   Collection Time: 04/22/2019  4:03 PM   Specimen: Nasopharyngeal Swab  Result Value Ref Range Status   SARS Coronavirus 2 NEGATIVE NEGATIVE Final    Comment: (NOTE) If result is NEGATIVE SARS-CoV-2 target nucleic acids are NOT DETECTED. The SARS-CoV-2 RNA is generally detectable in upper and lower  respiratory specimens during the acute phase of infection. The lowest  concentration of SARS-CoV-2 viral copies this assay can detect is 250  copies / mL. A negative result does not preclude SARS-CoV-2 infection  and should not be used as the sole basis for treatment or other  patient management decisions.  A negative result may occur with  improper specimen collection / handling, submission of specimen other  than nasopharyngeal swab, presence of viral mutation(s) within the  areas targeted by this assay, and inadequate number of viral copies  (<250 copies / mL). A negative result must be combined with clinical  observations, patient history, and epidemiological information. If result is POSITIVE SARS-CoV-2 target nucleic acids are DETECTED. The SARS-CoV-2 RNA is generally detectable in upper and lower  respiratory specimens dur ing the acute phase of infection.  Positive  results are indicative of active infection with SARS-CoV-2.  Clinical  correlation with patient history and other diagnostic information is  necessary to determine patient infection status.  Positive results do  not rule out bacterial infection or co-infection with other viruses. If result  is PRESUMPTIVE POSTIVE SARS-CoV-2 nucleic acids MAY BE PRESENT.   A presumptive positive result was obtained on the submitted specimen  and confirmed on repeat testing.  While 2019 novel coronavirus  (SARS-CoV-2)  nucleic acids may be present in the submitted sample  additional confirmatory testing may be necessary for epidemiological  and / or clinical management purposes  to differentiate between  SARS-CoV-2 and other Sarbecovirus currently known to infect humans.  If clinically indicated additional testing with an alternate test  methodology [phone]) is advised. The SARS-CoV-2 RNA is generally  detectable in upper and lower respiratory sp ecimens during the acute  phase of infection. The expected result is Negative. Fact Sheet for Patients:  StrictlyIdeas.no Fact Sheet for Healthcare Providers: BankingDealers.co.za This test is not yet approved or cleared by the [place] FDA and has been authorized for detection and/or diagnosis of SARS-CoV-2 by FDA under an Emergency Use Authorization (EUA).  This EUA will remain in effect (meaning this test can be used) for the duration of the COVID-19 declaration under Section 564(b)(1) of the Act, 21 U.S.C. section 360bbb-3(b)(1), unless the authorization is terminated or revoked sooner. Performed at [facility], [place] [address], [place], [place] [address]   Blood culture (routine x 2)     Status: None   Collection Time: 04/22/2019  7:55 PM   Specimen: BLOOD LEFT HAND  Result Value Ref Range Status   Specimen Description BLOOD LEFT HAND  Final   Special Requests   Final    BOTTLES DRAWN AEROBIC AND ANAEROBIC Blood Culture adequate volume   Culture   Final    NO GROWTH 5 DAYS Performed at [facility], [place] [address] [place], [place] [address]    Report Status 04/02/2019 FINAL  Final  Blood culture (routine x 2)     Status: None   Collection Time: 04/17/2019  8:04 PM    Specimen: BLOOD RIGHT HAND  Result Value Ref Range Status   Specimen Description BLOOD RIGHT HAND  Final   Special Requests   Final    BOTTLES DRAWN AEROBIC ONLY Blood Culture results may not be optimal due to an inadequate volume of blood received in culture bottles   Culture   Final    NO GROWTH 5 DAYS Performed at [facility], [place] [address], [place], [place] [address]    Report Status 04/02/2019 FINAL  Final  Urine culture     Status: None   Collection Time: 04/23/2019 12:35 AM   Specimen: Urine, Catheterized  Result Value Ref Range Status   Specimen Description URINE, CATHETERIZED  Final   Special Requests NONE  Final   Culture   Final    NO GROWTH Performed at [facility], [place] [address] [place], [place] [address]    Report Status 03/30/2019 FINAL  Final  MRSA PCR Screening     Status: None   Collection Time: 04/04/2019 12:39 PM   Specimen: Nasal Mucosa; Nasopharyngeal  Result Value Ref Range Status   MRSA by PCR NEGATIVE NEGATIVE Final    Comment:        The GeneXpert MRSA Assay (FDA approved for NASAL specimens only), is one component of a comprehensive MRSA colonization surveillance program. It is not intended to diagnose MRSA infection nor to guide or monitor treatment for MRSA infections. Performed at [facility], [place] [address] [place], [place] [address]   Culture, respiratory (non-expectorated)     Status: None   Collection Time: 03/31/19 11:20 AM   Specimen: Tracheal Aspirate; Respiratory  Result Value Ref Range Status   Specimen Description TRACHEAL ASPIRATE  Final   Special Requests NONE  Final   Gram  Stain   Final    RARE WBC PRESENT, PREDOMINANTLY PMN RARE YEAST Performed at [facility], [place] [address] [place], [place] [address]    Culture FEW CANDIDA ALBICANS  Final   Report Status 04/02/2019 FINAL  Final  Culture, bal-quantitative     Status: Abnormal   Collection Time: 04/04/19  8:49 PM   Specimen: Bronchoalveolar  Lavage; Respiratory  Result Value Ref Range Status   Specimen Description BRONCHIAL ALVEOLAR LAVAGE  Final   Special Requests NONE  Final   Gram Stain   Final    NO WBC SEEN RARE BUDDING YEAST SEEN Performed at [facility], [address], [place], [place] [address]    Culture 60,000 COLONIES/mL CANDIDA ALBICANS (A)  Final   Report Status 04/06/2019 FINAL  Final    Labs: Results for orders placed or performed during the hospital encounter of 04/14/2019 (from the past 48 hour(s))  Glucose, capillary     Status: Abnormal   Collection Time: 04/07/19 11:32 AM  Result Value Ref Range   Glucose-Capillary 125 (H) 70 - 99 mg/dL  Glucose, capillary     Status: Abnormal   Collection Time: 04/07/19  3:15 PM  Result Value Ref Range   Glucose-Capillary 148 (H) 70 - 99 mg/dL  Glucose, capillary     Status: Abnormal   Collection Time: 04/07/19  7:55 PM  Result Value Ref Range   Glucose-Capillary 180 (H) 70 - 99 mg/dL  Glucose, capillary     Status: Abnormal   Collection Time: 04/08/19 12:45 AM  Result Value Ref Range   Glucose-Capillary 188 (H) 70 - 99 mg/dL  Glucose, capillary     Status: Abnormal   Collection Time: 04/08/19  3:21 AM  Result Value Ref Range   Glucose-Capillary 151 (H) 70 - 99 mg/dL  Basic metabolic panel     Status: Abnormal   Collection Time: 04/08/19  3:50 AM  Result Value Ref Range   Sodium 153 (H) 135 - 145 mmol/L   Potassium 3.0 (L) 3.5 - 5.1 mmol/L   Chloride 126 (H) 98 - 111 mmol/L   CO2 21 (L) 22 - 32 mmol/L   Glucose, Bld 188 (H) 70 - 99 mg/dL   BUN 45 (H) 8 - 23 mg/dL   Creatinine, Ser 0.98 0.61 - 1.24 mg/dL   Calcium 8.1 (L) 8.9 - 10.3 mg/dL   GFR calc non Af Amer >60 >60 mL/min   GFR calc Af Amer >60 >60 mL/min   Anion gap 6 5 - 15    Comment: Performed at [facility], [address], [place], [place] [address]  CBC     Status: Abnormal   Collection Time: 04/08/19  3:50 AM  Result Value Ref Range   WBC 26.0 (H) 4.0 - 10.5 K/uL   RBC  2.99 (L) 4.22 - 5.81 MIL/uL   Hemoglobin 8.6 (L) 13.0 - 17.0 g/dL   HCT 24.7 (L) 39.0 - 52.0 %   MCV 82.6 80.0 - 100.0 fL   MCH 28.8 26.0 - 34.0 pg   MCHC 34.8 30.0 - 36.0 g/dL   RDW 23.3 (H) 11.5 - 15.5 %   Platelets 293 150 - 400 K/uL   nRBC 0.0 0.0 - 0.2 %    Comment: Performed at [place] [address], [place], [place] [address]  Glucose, capillary     Status: Abnormal   Collection Time: 04/08/19  7:36 AM  Result Value Ref Range  Glucose-Capillary 160 (H) 70 - 99 mg/dL  Glucose, capillary     Status: Abnormal   Collection Time: 04/08/19 11:35 AM  Result Value Ref Range   Glucose-Capillary 242 (H) 70 - 99 mg/dL  Glucose, capillary     Status: Abnormal   Collection Time: 04/08/19  3:21 PM  Result Value Ref Range   Glucose-Capillary 219 (H) 70 - 99 mg/dL  Heparin level (unfractionated)     Status: Abnormal   Collection Time: 04/08/19  5:00 PM  Result Value Ref Range   Heparin Unfractionated 0.16 (L) 0.30 - 0.70 IU/mL    Comment: (NOTE) If heparin results are below expected values, and patient dosage has  been confirmed, suggest follow up testing of antithrombin III levels. Performed at [facility], [place] [address] [place], [place] [address]   Glucose, capillary     Status: Abnormal   Collection Time: 04/08/19  7:26 PM  Result Value Ref Range   Glucose-Capillary 213 (H) 70 - 99 mg/dL  Glucose, capillary     Status: Abnormal   Collection Time: 04/08/19 11:28 PM  Result Value Ref Range   Glucose-Capillary 178 (H) 70 - 99 mg/dL  Heparin level (unfractionated)     Status: Abnormal   Collection Time: 04/09/19  2:55 AM  Result Value Ref Range   Heparin Unfractionated 0.20 (L) 0.30 - 0.70 IU/mL    Comment: (NOTE) If heparin results are below expected values, and patient dosage has  been confirmed, suggest follow up testing of antithrombin III levels. Performed at [facility], [place] [address] [place], [place] [address]   Glucose, capillary      Status: Abnormal   Collection Time: 04/09/19  3:51 AM  Result Value Ref Range   Glucose-Capillary 162 (H) 70 - 99 mg/dL  BMET in AM     Status: Abnormal   Collection Time: 04/09/19  4:20 AM  Result Value Ref Range   Sodium 151 (H) 135 - 145 mmol/L   Potassium 2.8 (L) 3.5 - 5.1 mmol/L   Chloride 124 (H) 98 - 111 mmol/L   CO2 20 (L) 22 - 32 mmol/L   Glucose, Bld 180 (H) 70 - 99 mg/dL   BUN 54 (H) 8 - 23 mg/dL   Creatinine, Ser 1.29 (H) 0.61 - 1.24 mg/dL   Calcium 8.2 (L) 8.9 - 10.3 mg/dL   GFR calc non Af Amer 51 (L) >60 mL/min   GFR calc Af Amer 59 (L) >60 mL/min   Anion gap 7 5 - 15    Comment: Performed at [facility] [address], [place],  [address]  Magnesium     Status: None   Collection Time: 04/09/19  4:20 AM  Result Value Ref Range   Magnesium 1.9 1.7 - 2.4 mg/dL    Comment: Performed at [facility] [address] [place], [place] [address]  Glucose, capillary     Status: Abnormal   Collection Time: 04/09/19  7:16 AM  Result Value Ref Range   Glucose-Capillary 149 (H) 70 - 99 mg/dL    Imaging / Studies: Dg Chest Port 1 View  Result Date: 04/08/2019 CLINICAL DATA:  Respiratory failure EXAM: PORTABLE CHEST 1 VIEW COMPARISON:  Yesterday FINDINGS: Endotracheal tube tip between the clavicular heads and carina. Right subclavian line with tip at the upper cavoatrial junction. The enteric tube tip reaches the pyloric region. Hazy bilateral airspace opacity pleural fluid on the left at least. Normal heart size and  mediastinal contours. No pneumothorax. IMPRESSION: 1. Stable hardware positioning. 2. Unchanged bilateral airspace disease with symmetry and small pleural effusions favoring pulmonary edema. Electronically Signed   By: Monte Fantasia M.D.   On: 04/08/2019 06:28    Medications / Allergies: per chart  Antibiotics: Anti-infectives (From admission, onward)   Start     Dose/Rate Route Frequency Ordered Stop   04/05/19 1200  ceFEPIme (MAXIPIME)  2 g in sodium chloride 0.9 % 100 mL IVPB     2 g 200 mL/hr over 30 Minutes Intravenous Every 12 hours 04/05/19 1131 04/08/19 2154   03/30/19 0600  cefoTEtan (CEFOTAN) 2 g in sodium chloride 0.9 % 100 mL IVPB    Note to Pharmacy: Pharmacy may adjust dose strength for optimal dosing.   Send with patient on call to the OR.  Anesthesia to complete antibiotic administration <54mn prior to [facility]   2 g 200 mL/hr over 30 Minutes Intravenous To Short Stay 04/08/2019 1335 04/09/2019 1559   04/13/2019 2200  cefoTEtan (CEFOTAN) 2 g in sodium chloride 0.9 % 100 mL IVPB     2 g 200 mL/hr over 30 Minutes Intravenous Every 12 hours 04/04/2019 1902 04/17/2019 2214   04/01/2019 1730  vancomycin (VANCOCIN) 1,250 mg in sodium chloride 0.9 % 250 mL IVPB  Status:  Discontinued     1,250 mg 166.7 mL/hr over 90 Minutes Intravenous Every 24 hours 04/02/2019 1720 04/19/2019 0928   03/30/2019 1400  neomycin (MYCIFRADIN) tablet 1,000 mg  Status:  Discontinued     1,000 mg Oral 3 times per day 04/10/2019 0620 04/17/2019 0839   04/16/2019 1400  metroNIDAZOLE (FLAGYL) tablet 1,000 mg  Status:  Discontinued     1,000 mg Oral 3 times per day 04/11/2019 0620 04/26/2019 0839   03/30/2019 1013  vancomycin variable dose per unstable renal function (pharmacist dosing)  Status:  Discontinued      Does not apply See admin instructions 04/07/2019 1014 04/02/2019 1908   04/06/2019 0930  metroNIDAZOLE (FLAGYL) IVPB 500 mg  Status:  Discontinued     500 mg 100 mL/hr over 60 Minutes Intravenous Every 8 hours 04/27/2019 0920 04/01/19 1054   04/16/2019 0630  cefoTEtan (CEFOTAN) 2 g in sodium chloride 0.9 % 100 mL IVPB  Status:  Discontinued     2 g 200 mL/hr over 30 Minutes Intravenous On call to O.R. 04/17/2019 0500910/02/20 0839   04/06/2019 0630  clindamycin (CLEOCIN) 900 mg, gentamicin (GARAMYCIN) 240 mg in sodium chloride 0.9 % 1,000 mL for intraperitoneal lavage  Status:  Discontinued      Irrigation To Surgery 04/13/2019 0620 04/21/2019 0840   04/06/2019  0500  ceFEPIme (MAXIPIME) 2 g in sodium chloride 0.9 % 100 mL IVPB  Status:  Discontinued     2 g 200 mL/hr over 30 Minutes Intravenous Every 12 hours 04/11/2019 1720 04/04/19 0459   04/13/2019 1630  ceFEPIme (MAXIPIME) 2 g in sodium chloride 0.9 % 100 mL IVPB     2 g 200 mL/hr over 30 Minutes Intravenous  Once 04/25/2019 1617 04/01/2019 1804   04/09/2019 1630  metroNIDAZOLE (FLAGYL) IVPB 500 mg     500 mg 100 mL/hr over 60 Minutes Intravenous  Once 04/07/2019 1617 03/30/2019 1928   04/15/2019 1630  vancomycin (VANCOCIN) IVPB 1000 mg/200 mL premix  Status:  Discontinued     1,000 mg 200 mL/hr over 60 Minutes Intravenous  Once 04/18/2019 1617 04/16/2019 1626   04/17/2019 1630  vancomycin (VANCOCIN) 1,500 mg in sodium  chloride 0.9 % 500 mL IVPB     1,500 mg 250 mL/hr over 120 Minutes Intravenous  Once 04/27/2019 1626 04/10/2019 2157        Note: Portions of this report may have been transcribed using voice recognition software. Every effort was made to ensure accuracy; however, inadvertent computerized transcription errors may be present.   Any transcriptional errors that result from this process are unintentional.     Adin Hector, MD, FACS, MASCRS Gastrointestinal and Minimally Invasive Surgery    [address] [place], [place] [address] [phone] Main / Paging [phone] Fax

## 2019-04-09 NOTE — Progress Notes (Signed)
ANTICOAGULATION CONSULT NOTE  Pharmacy Consult for heparin Indication: atrial fibrillation  Heparin Dosing Weight: 77.2 kg  Labs: Recent Labs    04/07/19 0440 04/08/19 0350 04/08/19 1700 04/09/19 0255 04/09/19 0420 04/09/19 1200  HGB 9.2* 8.6*  --   --   --   --   HCT 26.9* 24.7*  --   --   --   --   PLT 293 293  --   --   --   --   HEPARINUNFRC  --   --  0.16* 0.20*  --  0.35  CREATININE 0.97 0.98  --   --  1.29*  --     Assessment: 84 yom with new afib. Pharmacy consulted to start heparin drip. Pt is not on anticoagulation PTA. Hg drifting down to 8.6, plt wnl - no CBC drawn today. Noted colon cancer, SB volvulus, GI bleed s/p laparotomy but no current active bleed issues. Ok to start heparin per Dr. Halford Chessman with no bolus.  Heparin level now therapeutic at 0.35.  Goal of Therapy:  Heparin level 0.3-0.7 units/ml Monitor platelets by anticoagulation protocol: Yes   Plan:  Continue heparin at 1500 units/h Next heparin level with AM labs Daily heparin level/CBC Monitor s/sx bleeding   Elicia Lamp, PharmD, BCPS Clinical Pharmacist 04/09/2019 1:07 PM

## 2019-04-10 ENCOUNTER — Inpatient Hospital Stay (HOSPITAL_COMMUNITY): Payer: Medicare HMO

## 2019-04-10 LAB — CBC
HCT: 20.9 % — ABNORMAL LOW (ref 39.0–52.0)
Hemoglobin: 7.1 g/dL — ABNORMAL LOW (ref 13.0–17.0)
MCH: 28.3 pg (ref 26.0–34.0)
MCHC: 34 g/dL (ref 30.0–36.0)
MCV: 83.3 fL (ref 80.0–100.0)
Platelets: 283 10*3/uL (ref 150–400)
RBC: 2.51 MIL/uL — ABNORMAL LOW (ref 4.22–5.81)
RDW: 25.2 % — ABNORMAL HIGH (ref 11.5–15.5)
WBC: 15.3 10*3/uL — ABNORMAL HIGH (ref 4.0–10.5)
nRBC: 0.1 % (ref 0.0–0.2)

## 2019-04-10 LAB — PROTIME-INR
INR: 1.3 — ABNORMAL HIGH (ref 0.8–1.2)
Prothrombin Time: 16.4 seconds — ABNORMAL HIGH (ref 11.4–15.2)

## 2019-04-10 LAB — BASIC METABOLIC PANEL
Anion gap: 4 — ABNORMAL LOW (ref 5–15)
BUN: 59 mg/dL — ABNORMAL HIGH (ref 8–23)
CO2: 18 mmol/L — ABNORMAL LOW (ref 22–32)
Calcium: 8 mg/dL — ABNORMAL LOW (ref 8.9–10.3)
Chloride: 125 mmol/L — ABNORMAL HIGH (ref 98–111)
Creatinine, Ser: 1.4 mg/dL — ABNORMAL HIGH (ref 0.61–1.24)
GFR calc Af Amer: 54 mL/min — ABNORMAL LOW (ref >60)
GFR calc non Af Amer: 46 mL/min — ABNORMAL LOW (ref >60)
Glucose, Bld: 191 mg/dL — ABNORMAL HIGH (ref 70–99)
Potassium: 3.5 mmol/L (ref 3.5–5.1)
Sodium: 147 mmol/L — ABNORMAL HIGH (ref 135–145)

## 2019-04-10 LAB — GLUCOSE, CAPILLARY
Glucose-Capillary: 104 mg/dL — ABNORMAL HIGH (ref 70–99)
Glucose-Capillary: 110 mg/dL — ABNORMAL HIGH (ref 70–99)
Glucose-Capillary: 134 mg/dL — ABNORMAL HIGH (ref 70–99)
Glucose-Capillary: 160 mg/dL — ABNORMAL HIGH (ref 70–99)
Glucose-Capillary: 186 mg/dL — ABNORMAL HIGH (ref 70–99)
Glucose-Capillary: 192 mg/dL — ABNORMAL HIGH (ref 70–99)
Glucose-Capillary: 91 mg/dL (ref 70–99)

## 2019-04-10 LAB — HEPARIN LEVEL (UNFRACTIONATED): Heparin Unfractionated: 0.39 IU/mL (ref 0.30–0.70)

## 2019-04-10 LAB — MAGNESIUM: Magnesium: 1.8 mg/dL (ref 1.7–2.4)

## 2019-04-10 LAB — HEMOGLOBIN AND HEMATOCRIT, BLOOD
HCT: 24.6 % — ABNORMAL LOW (ref 39.0–52.0)
Hemoglobin: 8 g/dL — ABNORMAL LOW (ref 13.0–17.0)

## 2019-04-10 LAB — PREPARE RBC (CROSSMATCH)

## 2019-04-10 MED ORDER — PANTOPRAZOLE SODIUM 40 MG IV SOLR
40.0000 mg | Freq: Two times a day (BID) | INTRAVENOUS | Status: DC
  Administered 2019-04-10 – 2019-04-12 (×5): 40 mg via INTRAVENOUS
  Filled 2019-04-10 (×5): qty 40

## 2019-04-10 MED ORDER — ALBUMIN HUMAN 5 % IV SOLN
25.0000 g | Freq: Once | INTRAVENOUS | Status: AC
  Administered 2019-04-10: 25 g via INTRAVENOUS
  Filled 2019-04-10: qty 500

## 2019-04-10 MED ORDER — MAGNESIUM SULFATE 2 GM/50ML IV SOLN
2.0000 g | Freq: Once | INTRAVENOUS | Status: AC
  Administered 2019-04-10: 12:00:00 2 g via INTRAVENOUS
  Filled 2019-04-10: qty 50

## 2019-04-10 MED ORDER — MIDAZOLAM HCL 2 MG/2ML IJ SOLN
1.0000 mg | INTRAMUSCULAR | Status: DC | PRN
  Filled 2019-04-10: qty 2

## 2019-04-10 MED ORDER — FENTANYL CITRATE (PF) 100 MCG/2ML IJ SOLN: 25.0000 ug | INTRAMUSCULAR | Status: DC | PRN

## 2019-04-10 MED ORDER — FENTANYL CITRATE (PF) 100 MCG/2ML IJ SOLN
25.0000 ug | INTRAMUSCULAR | Status: DC | PRN
  Administered 2019-04-10 – 2019-04-12 (×5): 50 ug via INTRAVENOUS
  Filled 2019-04-10 (×5): qty 2

## 2019-04-10 MED ORDER — MIDAZOLAM HCL 2 MG/2ML IJ SOLN: 1.0000 mg | INTRAMUSCULAR | Status: DC | PRN

## 2019-04-10 MED ORDER — SODIUM CHLORIDE 0.9% IV SOLUTION
Freq: Once | INTRAVENOUS | Status: AC
  Administered 2019-04-10: 06:00:00 via INTRAVENOUS

## 2019-04-10 NOTE — Progress Notes (Signed)
Pt has dark red stool in ostomy, BP are soft MAPs high 50's -60's, Hgb 7.1 from 8.6 yesterday. MD Ogan made aware. Orders received to stop heparin gtt and transfuse 1 unit of blood then recheck Hgb 2 hrs post transfusion.

## 2019-04-10 NOTE — TOC Initial Note (Signed)
Transition of Care [facility]) - Initial/Assessment Note    Patient Details  Name: Darryl Johns MRN: [id number] Date of Birth: 02/24/1935  Transition of Care [facility]) CM/SW Contact:    Bartholomew Crews, RN Phone Number: [phone] 04/10/2019, 4:02 PM  Clinical Narrative:                 TOC following for transtion of care needs. Patient remains on vent unable to wean. Palliative meeting pending with family 10/15 6pm - trach and ltac vs withdrawal of care and comfort??? Noted GI Bleed overnight with 1 u PRBCs. Wound vac discontinued today - now with dressing changes.     Barriers to Discharge: Continued Medical Work up   Patient Goals and CMS Choice        Expected Discharge Plan and Services     Discharge Planning Services: CM Consult                                          Prior Living Arrangements/Services   Lives with:: Self, Spouse                   Activities of Daily Living      Permission Sought/Granted                  Emotional Assessment              Admission diagnosis:  Dehydration [E86.0] Lactic acidosis [E87.2] Hyperammonemia ([place]) [E72.20] Hypotension, unspecified hypotension type [I95.9] Altered mental status, unspecified altered mental status type [R41.82] GI bleed [K92.2] Patient Active Problem List   Diagnosis Date Noted  . Left ventricular diastolic dysfunction with preserved systolic function 0000000  . Anemia due to GI blood loss 04/09/2019  . Anemia of chronic disease 04/09/2019  . High output ileostomy ([place]) 04/09/2019  . Pressure injury of skin 04/06/2019  . Acute respiratory distress   . Aspiration into airway   . Acute respiratory failure with hypoxia ([place])   . Colon bleed requiring more than 4 units of blood in 24 hours, ICU, or surgery 04/03/2019  . Generalized anxiety disorder 04/03/2019  . Coagulopathy ([place]) 04/02/2019  . Acute ischemia of jejunum s/p SB resection 04/15/2019 04/01/2019  . Emphysema of  lung ([place]) 04/01/2019  . Protein-calorie malnutrition, severe 04/10/2019  . Cancer of transverse colon pT4b, 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 04/22/2019  . Lung mass - primary vs colon metastasis 04/23/2019  . HOH (hard of hearing) 04/07/2019  . Coffee ground emesis 04/17/2019  . 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 Shock ([place]) 04/14/2019  . Cachexia ([place]) 04/22/2019  . Elevated carcinoembryonic antigen (CEA) 04/02/2019  . Recent unintentional weight loss over several months 04/17/2019  . Hemorrhagic shock ([place])   . AKI (acute kidney injury) ([place])   . Sepsis ([place]) 04/14/2019  . Peripheral neuropathy 09/04/2018  . Gait abnormality 09/04/2018  . Hypotension 05/17/2018  . Essential hypertension 12/23/2014  . RBBB 12/23/2014  . Dizziness 12/23/2014   PCP:  Kristie Cowman, MD Pharmacy:   [id number] - [place], [place] [address] Phone: [phone] Fax: [phone]     Social Determinants of Health (SDOH) Interventions    Readmission Risk Interventions No flowsheet data found.

## 2019-04-10 NOTE — Consult Note (Signed)
[place] Nurse wound follow up Wound type:surgical Measurement: per Monday Wound [id number], moist Drainage (amount, consistency, odor) scant serous Periwound:intact Dressing procedure/placement/frequency: NPWT discontinued today in favor of saline moistened gauze.  Staff have orders for the daily care of this wound from Dr. Johney Maine.   [place] Nurse ostomy follow up Stoma type/location: RUQ ileostomy.  Pouch applied Monday is intact.   Stomal assessment/size: per Monday, 1 and 1/2 inches round, raised. Peristomal assessment: Not seen today Treatment options for stomal/peristomal skin: skin barrier ring placed on Monday. Output: dark brown liquid efluent in pouch Ostomy pouching: 2pc. 2 and 3/4 inches with skin barrier ring.  Three pouching set-ups (3 rings, 3 pouches, 3 skin barriers) are in box on nightstand in room. Education provided: None.  Enrolled patient in [place] Start Discharge program: No

## 2019-04-10 NOTE — Progress Notes (Signed)
[facility]/Trauma Progress Note  12 Days Post-Op   Assessment/Plan Acute hypoxic respiratory failure -too weak for extubation, family discussions for tracheostomy per critical care New onset A. fib Hyponatremia Hypokalemia Anemia of critical illness Acute metabolic encephalopathy  Transverse colon mass, adenocarcinoma.  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 Alwyn Pea, 03/30/2019 - Path showed adenocarcinoma with 1/32 lymph node involvement, mpT4b, pN1a - Palliative care consult pending - Wound wet to dry  - emesis of enteric looking material today, NGT to LIWS, stop TF's, AXR pending. Concerning for aspiration. Stopped loperamide. Protonix BID IV, ostomy output is bloody, anastomotic bleed?  FEN: NGT to LIWS, protonix IV BID, holding TF's VTE: SCD's, chemical prophylaxis per CCM but likely on hold due to anemia ID: No antibiotics currently, WBC 15.3 Follow up: Dr. Johney Maine    LOS: 13 days    Subjective/CC:  Dark brown enteric looking emesis this am. Dropped Hgb overnight and got 1U. ileostomy output is bloody. NGT to LIWS and got >1200 out of dark brown material. Pt is on the vent and states no pain.   Objective: Vital signs in last 24 hours: Temp:  [97.6 F (36.4 C)-98.8 F (37.1 C)] 97.6 F (36.4 C) (10/14 0815) Pulse Rate:  [67-103] 103 (10/14 1058) Resp:  [23-35] 35 (10/14 1058) BP: (87-121)/(40-71) 105/53 (10/14 1058) SpO2:  [98 %-100 %] 100 % (10/14 1058) FiO2 (%):  [40 %] 40 % (10/14 1058) Weight:  [76.2 kg] 76.2 kg (10/14 0434) Last BM Date: 04/10/19  Intake/Output from previous day: 10/13 0701 - 10/14 0700 In: 3768.1 [I.V.:1714.8; NG/GT:1510; IV Piggyback:543.2] Out: 2125 [Urine:975; Stool:1150] Intake/Output this shift: Total I/O In: 515 [I.V.:100;  Blood:315; Other:100] Out: -   PE: Gen:  Alert, NAD HEENT: dark brown emesis, NGT in place with >1200 out in a few minutes to suction Pulm:  On vent Abd: Soft, distended, tinkling BS, ostomy output is bloody, wound looks clean and well healing, no guarding on palpation Skin: warm and dry   Anti-infectives: Anti-infectives (From admission, onward)   Start     Dose/Rate Route Frequency Ordered Stop   04/05/19 1200  ceFEPIme (MAXIPIME) 2 g in sodium chloride 0.9 % 100 mL IVPB     2 g 200 mL/hr over 30 Minutes Intravenous Every 12 hours 04/05/19 1131 04/08/19 2154   03/30/19 0600  cefoTEtan (CEFOTAN) 2 g in sodium chloride 0.9 % 100 mL IVPB    Note to Pharmacy: Pharmacy may adjust dose strength for optimal dosing.   Send with patient on call to the OR.  Anesthesia to complete antibiotic administration <4min prior to incision per [facility].   2 g 200 mL/hr over 30 Minutes Intravenous To Short Stay 04/04/2019 1335 04/19/2019 1559   04/10/2019 2200  cefoTEtan (CEFOTAN) 2 g in sodium chloride 0.9 % 100 mL IVPB     2 g 200 mL/hr over 30 Minutes Intravenous Every 12 hours 04/10/2019 1902 04/24/2019 2214   03/30/2019 1730  vancomycin (VANCOCIN) 1,250 mg in sodium chloride 0.9 % 250 mL IVPB  Status:  Discontinued     1,250 mg 166.7 mL/hr over 90 Minutes Intravenous Every 24 hours 04/20/2019 1720 03/31/2019 0928   03/28/2019 1400  neomycin (MYCIFRADIN) tablet 1,000 mg  Status:  Discontinued     1,000 mg Oral 3 times per day 04/15/2019 0620 04/15/2019 0839   04/05/2019 1400  metroNIDAZOLE (FLAGYL) tablet 1,000 mg  Status:  Discontinued     1,000 mg Oral 3 times per day 04/09/2019 0620 04/25/2019 0839   04/27/2019 1013  vancomycin variable dose per unstable renal function (pharmacist dosing)  Status:  Discontinued      Does not apply See admin instructions 04/19/2019 1014 04/10/2019 1908   04/08/2019 0930  metroNIDAZOLE (FLAGYL) IVPB 500 mg  Status:  Discontinued     500 mg 100 mL/hr over 60 Minutes Intravenous Every 8 hours  03/30/2019 0920 04/01/19 1054   04/06/2019 0630  cefoTEtan (CEFOTAN) 2 g in sodium chloride 0.9 % 100 mL IVPB  Status:  Discontinued     2 g 200 mL/hr over 30 Minutes Intravenous On call to O.R. 04/17/2019 [id number] 04/09/2019 0839   04/16/2019 0630  clindamycin (CLEOCIN) 900 mg, gentamicin (GARAMYCIN) 240 mg in sodium chloride 0.9 % 1,000 mL for intraperitoneal lavage  Status:  Discontinued      Irrigation To Surgery 04/19/2019 0620 03/28/2019 0840   04/11/2019 0500  ceFEPIme (MAXIPIME) 2 g in sodium chloride 0.9 % 100 mL IVPB  Status:  Discontinued     2 g 200 mL/hr over 30 Minutes Intravenous Every 12 hours 03/30/2019 1720 04/04/19 0459   04/23/2019 1630  ceFEPIme (MAXIPIME) 2 g in sodium chloride 0.9 % 100 mL IVPB     2 g 200 mL/hr over 30 Minutes Intravenous  Once 04/24/2019 1617 04/24/2019 1804   04/04/2019 1630  metroNIDAZOLE (FLAGYL) IVPB 500 mg     500 mg 100 mL/hr over 60 Minutes Intravenous  Once 04/05/2019 1617 04/16/2019 1928   04/17/2019 1630  vancomycin (VANCOCIN) IVPB 1000 mg/200 mL premix  Status:  Discontinued     1,000 mg 200 mL/hr over 60 Minutes Intravenous  Once 03/29/2019 1617 04/02/2019 1626   04/10/2019 1630  vancomycin (VANCOCIN) 1,500 mg in sodium chloride 0.9 % 500 mL IVPB     1,500 mg 250 mL/hr over 120 Minutes Intravenous  Once 04/20/2019 1626 03/31/2019 2157      Lab Results:  Recent Labs    04/08/19 0350 04/10/19 0423 04/10/19 1028  WBC 26.0* 15.3*  --   HGB 8.6* 7.1* 8.0*  HCT 24.7* 20.9* 24.6*  PLT 293 283  --    BMET Recent Labs    04/09/19 2000 04/10/19 0423  NA 147* 147*  K 3.5 3.5  CL 123* 125*  CO2 17* 18*  GLUCOSE 243* 191*  BUN 58* 59*  CREATININE 1.38* 1.40*  CALCIUM 8.0* 8.0*   PT/INR No results for input(s): LABPROT, INR in the last 72 hours. CMP     Component Value Date/Time   NA 147 (H) 04/10/2019 0423   K 3.5 04/10/2019 0423   CL 125 (H) 04/10/2019 0423   CO2 18 (L) 04/10/2019 0423   GLUCOSE 191 (H) 04/10/2019 0423   BUN 59 (H) 04/10/2019 0423   CREATININE  1.40 (H) 04/10/2019 0423   CALCIUM 8.0 (L) 04/10/2019 0423   PROT 4.6 (L) 04/01/2019 0454   PROT 8.4 07/30/2018 1216   ALBUMIN 1.5 (L) 04/01/2019 0454   AST 37 04/01/2019 0454   ALT 33 04/01/2019 0454   ALKPHOS 62 04/01/2019 0454   BILITOT 2.0 (H) 04/01/2019 0454   GFRNONAA 46 (L) 04/10/2019 0423   GFRAA 54 (L) 04/10/2019 0423   Lipase  No results found for: LIPASE  Studies/Results: Dg Chest Port 1 View  Result Date: 04/10/2019 CLINICAL DATA:  Respiratory failure. EXAM: PORTABLE CHEST 1 VIEW COMPARISON:  April 08, 2019. FINDINGS: The heart size and mediastinal contours are within normal limits. Endotracheal and nasogastric tubes are unchanged in position. No pneumothorax is noted. Atherosclerosis of thoracic aorta is noted. Stable bilateral lung opacities are noted consistent with edema and associated pleural effusions. The visualized skeletal structures are unremarkable. IMPRESSION: Stable support apparatus.  Stable bilateral lung opacities. Electronically Signed   By: Marijo Conception M.D.   On: 04/10/2019 07:27     Kalman Drape, [facility] Surgery Pager [phone] Cristine Polio, & Friday 7:00am - 4:30pm Thursdays 7:00am -11:30am

## 2019-04-10 NOTE — Progress Notes (Signed)
[place] Progress Note Patient Name: Darryl Johns DOB: [birth year] MRN: [id number]   Date of Service  04/10/2019  HPI/Events of Note  Hypotension  eICU Interventions  Albumin 5 % 250 ml iv x 1        Ramey Schiff U [place] 04/10/2019, 4:15 AM

## 2019-04-10 NOTE — Progress Notes (Signed)
 Nutrition Follow-up  DOCUMENTATION CODES:   Severe malnutrition in context of chronic illness  INTERVENTION:   Once medically appropriate, resume Tube Feeding:  Pivot 1.5 at 55 ml/hr Provides 1980 kcals, 124 g of protein and 1003 mL of free water  NUTRITION DIAGNOSIS:   Severe Malnutrition related to cancer and cancer related treatments, chronic illness(colon cancer, SBO) as evidenced by severe fat depletion, severe muscle depletion, energy intake < 75% for > or equal to 1 month.  Being addressed via TF   GOAL:   Patient will meet greater than or equal to 90% of their needs  Progressing  MONITOR:   Diet advancement, Labs, Weight trends, Skin  REASON FOR ASSESSMENT:   Other (Comment)    ASSESSMENT:   84 yo male admitted with shock, aspiration pneumonia with incidental finding of lung nodule on CT, suspected colon cancer with possible GI bleed, AKI. PMH includes HTN, peripheral neuropathy   10/02 Intubated, Ex Lap, Right Colectomy with End Ileostomy, Jejunal SB resection with anastomosis, wedge biopsy of liver mass, application of wound VAC over closed fascia 10/05 Trickle TF initiated 10/06 Off pressors 10/07 Started TF advancement 10/08 Extubated, required reintubation  Noted further discussions regarding proceeding with trach tomorrow  Patient is currently intubated on ventilator support MV: 18.7 L/min Temp (24hrs), Avg:98.2 F (36.8 C), Min:97.6 F (36.4 C), Max:98.8 F (37.1 C)  Dark brown emesis this AM; Hgb dropped over night. NG tube with >1200 mL. TF on hold  +old blood via ileostomy, 1150 mL of content out in previous 24 hours  Previously tolerating Pivot 1.5 at 55 ml/hr, free water 200 mL q 4 hours  Weight up to 76.2 kg; admission weight 42/66 kg, Net + 18 L per I/O flow sheet  Labs: sodium 147 (H), Creatinine 1.40, BUN 59 Meds: D5 at 50 ml/hr, ferrous sulfate, KCl   Diet Order:   Diet Order            Diet NPO time specified  Diet effective  now              EDUCATION NEEDS:   Not appropriate for education at this time  Skin:  Skin Assessment: Skin Integrity Issues: Skin Integrity Issues:: Wound VAC Wound Vac: surgical abdominal incision  Last BM:  10/9 ileostomy (1050 ml output on 10/8)  Height:   Ht Readings from Last 1 Encounters:  04/08/19 6\' 2"  (1.88 m)    Weight:   Wt Readings from Last 1 Encounters:  04/10/19 76.2 kg    Ideal Body Weight:  86.4 kg  BMI:  Body mass index is 21.57 kg/m.  Estimated Nutritional Needs:   Kcal:  2035  Protein:  100-130 g  Fluid:  >/= 2 L    Dewana Ammirati MS, RDN, LDN, CNSC [phone] Pager  [phone] Weekend/On-Call Pager

## 2019-04-10 NOTE — Progress Notes (Signed)
NAME:  Darryl Johns, MRN:  [id number], DOB:  [birth year], LOS: 82 ADMISSION DATE:  04/06/2019, CONSULTATION DATE:   REFERRING MD: Velna Ochs MD, CHIEF COMPLAINT:  GI bleed  Brief History   84 yo male former smoker presented to ER with altered mental status after falling.  Found to have hematemesis and melena with hemorrhagic shock.  CT imaging showed transverse colon mass and SB volvulus.  Required ventilatory support and pressors.  Past Medical History  RBBB, Sensorimotor axonal peripheral neuropathy, HTN, allergies, colon mass, Sjogren's syndrome, Emphysema  Significant Hospital Events   10/02 laparotomy with Rt colectomy and end ileostomy, jejunal SB resection with anastomosis, wedge bx of liver mass, application of wound vac over closed fascia  10/06 off pressors 10/08 extubated >> reintubated with concern for airway protection 10/12 A fib with RVR, start heparin gtt  Consults:  GI Surgery  Procedures:  ETT 10/2 >> 10/8 Rt  CVL 10/2 >> ETT 10/8 >>  Significant Diagnostic Tests:  CTA 10/1 >> no PE, right lower lobe consolidation.  1.4 cm left upper lobe nodule.  Emphysema, aortic, coronary atherosclerosis. CT head / neck 10/1 >> chronic ischemic changes.  No acute abnormalities. Echo 10/12 >> EF 60 to 65%, mild MR/TR, grade 1 DD Colon biopsy 10/2 > Invasive poorly differentiated adenocarcinoma, 12 cm, involving  transverse colon, with marked necrosis. Carcinoma invades through the serosa into adherent jejunum. Lymphovascular invasion is present   Micro Data:  COVID 10/1 >> negative  Tracheal aspirate 10/4 >> Predom PMN, Rare yeast BCx2 10/1 >> negative BAL 10/8 >> Candida albicans  Antimicrobials:  Cefepime 10/1 >> 10/7 Cefepime 10/9 >> 10/12  Interim history/subjective:  Failed SBT yesterday. GIB overnight so heparin held and given 1 unit PRBC. On full support this morning with copious secretions.   Objective   Blood pressure (!) 92/51, pulse 76,  temperature 98.1 F (36.7 C), temperature source Oral, resp. rate (!) 26, height _0  (1.88 m), weight 76.2 kg, SpO2 100 %.    Vent Mode: PRVC FiO2 (%):  [40 %] 40 % Set Rate:  [24 bmp] 24 bmp Vt Set:  [570 mL] 570 mL PEEP:  [5 cmH20] 5 cmH20 Plateau Pressure:  [21 cmH20-28 cmH20] 21 cmH20   Intake/Output Summary (Last 24 hours) at 04/10/2019 1016 Last data filed at 04/10/2019 0900 Gross per 24 hour  Intake 3561.82 ml  Output 2125 ml  Net 1436.82 ml   Filed Weights   04/08/19 0800 04/09/19 0407 04/10/19 0434  Weight: 77.2 kg 73.3 kg 76.2 kg    Examination:.  General - frail elderly male in NAD Eyes - pupils reactive to light.  ENT - ETT in place Cardiac - RRR, no MRG Chest - Coarse bilaterally. Abdomen - wound vac in place, Ostomy look beefy red, draining melanic stool.  Extremities - 1+ edema.  Skin - no rashes Neuro - alert, follows simple commands.   Resolved Hospital Problem list   Lactic Acidosis, Hemorrhagic shock, Acute GI bleed, Elevated ammonia in setting of GI bleed, HCAP  Assessment & Plan:    Acute hypoxic respiratory failure. Emphysema. Interstitial edema. - Copious secretions, start chest PT - Unable to SBT and concerned he is too weak for extubation trial at this time - Tracheostomy has been discussed with family and they are considering options.  - Palliative care evaluation will be helpful, will consult.  - prn BDs  New onset A fib 10/12. - continue lopressor - heparin infusion on hold considering GIB -  check INR  Lt upper lobe nodule. 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 Severe protein calorie malnutrition. - post op care per surgery  Metastatic adenocarcinoma of colon: s/p resection of two masses 10/2. Path report indicates lymph involvement.  - Not a candidate for oncologic interventions at this time - Needs further staging if able to improve.  - Palliative consulted.    AKI: Creatinine creeping up. 1.4 today.  Hypokalemia Hypernatremia. - D5W at 50 ml/hr and free water - f/u BMET  Anemia of critical illness. Now with acute GIB from ostomy 10/14 - f/u CBC - transfuse for Hb < 7 or significant bleeding - Hold heparin - Check coags and H&H (s/p 1 unit PRBC today for Hgb 7.1)  Acute metabolic encephalopathy. Recent w/u for peripheral neuropathy with concern for Sjogren's syndrome. - RASS goal 0 to -1 - Drips to PRN.  - continue neurontin   Best practice:  Diet: tube feeds DVT prophylaxis: SCDs GI prophylaxis: protonix Mobility: Bed Code Status: Full Code   Disposition: ICU  Labs    CMP Latest Ref Rng & Units 04/10/2019 04/09/2019 04/09/2019  Glucose 70 - 99 mg/dL 191(H) 243(H) 180(H)  BUN 8 - 23 mg/dL 59(H) 58(H) 54(H)  Creatinine 0.61 - 1.24 mg/dL 1.40(H) 1.38(H) 1.29(H)  Sodium 135 - 145 mmol/L 147(H) 147(H) 151(H)  Potassium 3.5 - 5.1 mmol/L 3.5 3.5 2.8(L)  Chloride 98 - 111 mmol/L 125(H) 123(H) 124(H)  CO2 22 - 32 mmol/L 18(L) 17(L) 20(L)  Calcium 8.9 - 10.3 mg/dL 8.0(L) 8.0(L) 8.2(L)  Total Protein 6.5 - 8.1 g/dL - - -  Total Bilirubin 0.3 - 1.2 mg/dL - - -  Alkaline Phos 38 - 126 U/L - - -  AST 15 - 41 U/L - - -  ALT 0 - 44 U/L - - -   CBC Latest Ref Rng & Units 04/10/2019 04/08/2019 04/07/2019  WBC 4.0 - 10.5 K/uL 15.3(H) 26.0(H) 32.6(H)  Hemoglobin 13.0 - 17.0 g/dL 7.1(L) 8.6(L) 9.2(L)  Hematocrit 39.0 - 52.0 % 20.9(L) 24.7(L) 26.9(L)  Platelets 150 - 400 K/uL 283 293 293   ABG    Component Value Date/Time   PHART 7.391 04/04/2019 2214   PCO2ART 33.3 04/04/2019 2214   PO2ART 66.0 (L) 04/04/2019 2214   HCO3 20.0 04/04/2019 2214   TCO2 21 (L) 04/04/2019 2214   ACIDBASEDEF 4.0 (H) 04/04/2019 2214   O2SAT 92.0 04/04/2019 2214   CBG (last 3)  Recent Labs    04/10/19 0005 04/10/19 0335 04/10/19 0727  GLUCAP 192* 186* 134*    CRITICAL CARE Performed by: Corey Harold   Total critical care time: 35 minutes   Critical care time was exclusive of separately billable procedures and treating other patients.  Critical care was necessary to treat or prevent imminent or life-threatening deterioration.  Critical care was time spent personally by me on the following activities: development of treatment plan with patient and/or surrogate as well as nursing, discussions with consultants, evaluation of patient's response to treatment, examination of patient, obtaining history from patient or surrogate, ordering and performing treatments and interventions, ordering and review of laboratory studies, ordering and review of radiographic studies, pulse oximetry and re-evaluation of patient's condition.    Georgann Housekeeper, AGACNP-BC [place] Pager [phone] or [phone]  04/10/2019 10:29 AM

## 2019-04-10 NOTE — Progress Notes (Signed)
[facility] Progress Note Patient Name: Darryl Johns DOB: [birth year] MRN: [id number]   Date of Service  04/10/2019  HPI/Events of Note  GI bleeding  eICU Interventions  Hold heparin infusion, transfuse one unit of PRBC, H & H two hours after PRBC transfusion.        [name] 04/10/2019, 4:48 AM

## 2019-04-11 ENCOUNTER — Inpatient Hospital Stay (HOSPITAL_COMMUNITY): Payer: Medicare HMO

## 2019-04-11 DIAGNOSIS — K55011 Focal (segmental) acute (reversible) ischemia of small intestine: Secondary | ICD-10-CM

## 2019-04-11 LAB — POCT I-STAT 7, (LYTES, BLD GAS, ICA,H+H)
Acid-base deficit: 7 mmol/L — ABNORMAL HIGH (ref 0.0–2.0)
Bicarbonate: 15.4 mmol/L — ABNORMAL LOW (ref 20.0–28.0)
Calcium, Ion: 1.36 mmol/L (ref 1.15–1.40)
HCT: 22 % — ABNORMAL LOW (ref 39.0–52.0)
Hemoglobin: 7.5 g/dL — ABNORMAL LOW (ref 13.0–17.0)
O2 Saturation: 90 %
Patient temperature: 98.4
Potassium: 3.7 mmol/L (ref 3.5–5.1)
Sodium: 149 mmol/L — ABNORMAL HIGH (ref 135–145)
TCO2: 16 mmol/L — ABNORMAL LOW (ref 22–32)
pCO2 arterial: 19.6 mmHg — CL (ref 32.0–48.0)
pH, Arterial: 7.502 — ABNORMAL HIGH (ref 7.350–7.450)
pO2, Arterial: 51 mmHg — ABNORMAL LOW (ref 83.0–108.0)

## 2019-04-11 LAB — TYPE AND SCREEN
ABO/RH(D): B POS
Antibody Screen: NEGATIVE
Unit division: 0

## 2019-04-11 LAB — BASIC METABOLIC PANEL
Anion gap: 6 (ref 5–15)
BUN: 58 mg/dL — ABNORMAL HIGH (ref 8–23)
CO2: 18 mmol/L — ABNORMAL LOW (ref 22–32)
Calcium: 8.5 mg/dL — ABNORMAL LOW (ref 8.9–10.3)
Chloride: 122 mmol/L — ABNORMAL HIGH (ref 98–111)
Creatinine, Ser: 1.63 mg/dL — ABNORMAL HIGH (ref 0.61–1.24)
GFR calc Af Amer: 45 mL/min — ABNORMAL LOW (ref >60)
GFR calc non Af Amer: 39 mL/min — ABNORMAL LOW (ref >60)
Glucose, Bld: 156 mg/dL — ABNORMAL HIGH (ref 70–99)
Potassium: 3.6 mmol/L (ref 3.5–5.1)
Sodium: 146 mmol/L — ABNORMAL HIGH (ref 135–145)

## 2019-04-11 LAB — GLUCOSE, CAPILLARY
Glucose-Capillary: 112 mg/dL — ABNORMAL HIGH (ref 70–99)
Glucose-Capillary: 113 mg/dL — ABNORMAL HIGH (ref 70–99)
Glucose-Capillary: 113 mg/dL — ABNORMAL HIGH (ref 70–99)
Glucose-Capillary: 115 mg/dL — ABNORMAL HIGH (ref 70–99)
Glucose-Capillary: 121 mg/dL — ABNORMAL HIGH (ref 70–99)
Glucose-Capillary: 144 mg/dL — ABNORMAL HIGH (ref 70–99)

## 2019-04-11 LAB — CBC
HCT: 22.3 % — ABNORMAL LOW (ref 39.0–52.0)
HCT: 21.7 % — ABNORMAL LOW (ref 39.0–52.0)
Hemoglobin: 7.2 g/dL — ABNORMAL LOW (ref 13.0–17.0)
Hemoglobin: 7.3 g/dL — ABNORMAL LOW (ref 13.0–17.0)
MCH: 26.7 pg (ref 26.0–34.0)
MCH: 27.5 pg (ref 26.0–34.0)
MCHC: 32.3 g/dL (ref 30.0–36.0)
MCHC: 33.6 g/dL (ref 30.0–36.0)
MCV: 82.6 fL (ref 80.0–100.0)
MCV: 81.9 fL (ref 80.0–100.0)
Platelets: 297 10*3/uL (ref 150–400)
Platelets: 329 10*3/uL (ref 150–400)
RBC: 2.7 MIL/uL — ABNORMAL LOW (ref 4.22–5.81)
RBC: 2.65 MIL/uL — ABNORMAL LOW (ref 4.22–5.81)
RDW: 22.1 % — ABNORMAL HIGH (ref 11.5–15.5)
RDW: 22.2 % — ABNORMAL HIGH (ref 11.5–15.5)
WBC: 12 10*3/uL — ABNORMAL HIGH (ref 4.0–10.5)
WBC: 11 10*3/uL — ABNORMAL HIGH (ref 4.0–10.5)
nRBC: 0.2 % (ref 0.0–0.2)
nRBC: 0.3 % — ABNORMAL HIGH (ref 0.0–0.2)

## 2019-04-11 LAB — BPAM RBC
Blood Product Expiration Date: 202011102359
ISSUE DATE / TIME: 202010140620
Unit Type and Rh: 7300

## 2019-04-11 LAB — SURGICAL PATHOLOGY

## 2019-04-11 MED ORDER — FUROSEMIDE 10 MG/ML IJ SOLN
40.0000 mg | Freq: Once | INTRAMUSCULAR | Status: AC
  Administered 2019-04-11: 14:00:00 40 mg via INTRAVENOUS
  Filled 2019-04-11: qty 4

## 2019-04-11 NOTE — Significant Event (Signed)
PCCM INTERVAL PROGRESS NOTE   Family discussion regarding goals of care. Present was his wife and two sisters. They understand he is in a tough situation and has been unable to wean from vent. We nearing the necessity for tracheostomy if they continue further aggressive care. I presented this option to them with the caution that he is very frail with multiple medical problems. He has not given any indication that he will hastily wean from vent.   They are considering tracheostomy and the continuation of aggressive care, versus a transition towards comfort. They would like to make him DNR at this point.    Georgann Housekeeper, AGACNP-BC [place] Pager [phone] or [phone]  04/11/2019 6:08 PM

## 2019-04-11 NOTE — Progress Notes (Addendum)
NAME:  Darryl Johns, MRN:  [id number], DOB:  1934/11/18, LOS: 6 ADMISSION DATE:  04/25/2019, CONSULTATION DATE:   REFERRING MD: Velna Ochs MD, CHIEF COMPLAINT:  GI bleed  Brief History   84 yo male former smoker presented to ER with altered mental status after falling.  Found to have hematemesis and melena with hemorrhagic shock.  CT imaging showed transverse colon mass and SB volvulus.  Required ventilatory support and pressors.  Past Medical History  RBBB, Sensorimotor axonal peripheral neuropathy, HTN, allergies, colon mass, Sjogren's syndrome, Emphysema  Significant Hospital Events   10/02 laparotomy with Rt colectomy and end ileostomy, jejunal SB resection with anastomosis, wedge bx of liver mass, application of wound vac over closed fascia  10/06 off pressors 10/08 extubated >> reintubated with concern for airway protection 10/12 A fib with RVR, start heparin gtt  Consults:  GI Surgery  Procedures:  ETT 10/2 >> 10/8 Rt [place] CVL 10/2 >> ETT 10/8 >>  Significant Diagnostic Tests:  CTA 10/1 > no PE, right lower lobe consolidation.  1.4 cm left upper lobe nodule.  Emphysema, aortic, coronary atherosclerosis. CT head / neck 10/1 >> chronic ischemic changes.  No acute abnormalities. Echo 10/12 > EF 60 to 65%, mild MR/TR, grade 1 DD Colon biopsy 10/2 > Invasive poorly differentiated adenocarcinoma, 12 cm, involving  transverse colon, with marked necrosis. Carcinoma invades through the serosa into adherent jejunum. Lymphovascular invasion is present.  Micro Data:  COVID 10/1 >> negative  Tracheal aspirate 10/4 >> Predom PMN, Rare yeast BCx2 10/1 >> negative BAL 10/8 >> Candida albicans  Antimicrobials:  Cefepime 10/1 >> 10/7 Cefepime 10/9 >> 10/12  Interim history/subjective:  Continues with melena from stoma. Heparin held. TF held due to aspiration concern.   Objective   Blood pressure (!) 106/45, pulse 80, temperature 99 F (37.2 C), temperature source Oral,  resp. rate (!) 30, height _0  (1.88 m), weight 75.3 kg, SpO2 100 %.    Vent Mode: PRVC FiO2 (%):  [30 %-40 %] 30 % Set Rate:  [24 bmp] 24 bmp Vt Set:  [570 mL] 570 mL PEEP:  [5 cmH20] 5 cmH20 Plateau Pressure:  [21 cmH20-26 cmH20] 26 cmH20   Intake/Output Summary (Last 24 hours) at 04/11/2019 0901 Last data filed at 04/11/2019 0800 Gross per 24 hour  Intake 1491.55 ml  Output 2575 ml  Net -1083.45 ml   Filed Weights   04/09/19 0407 04/10/19 0434 04/11/19 0321  Weight: 73.3 kg 76.2 kg 75.3 kg    Examination: General - frail elderly male on vent in NAD HEENT - PERRL, no JVD, [place]/AT Cardiac - RRR, no MRG Chest - Bilaterally coarse. Tachypnea on vent.  Abdomen - Ostomy look beefy red, draining melanic stool Extremities - 1+ edema to upper and lower extremities.  Skin - no rashes Neuro - alert, follows simple commands  Resolved Hospital Problem list   Lactic Acidosis, Hemorrhagic shock, Acute GI bleed, Elevated ammonia in setting of GI bleed, HCAP  Assessment & Plan:    Acute hypoxic respiratory failure. Emphysema. Interstitial edema. - Copious secretions (improved), start chest PT. TF held. - Not tolerating SBT, may be simply too weak and deconditioned for extubation.  - Tracheostomy has been discussed with family and they are considering options. Coming in for family meeting today at 5 pm - Palliative consulted - prn BDs  ? Aspiration event 10/14 on TF - CXR - Trend WBC and fever curve. Monitor off ABX.   New onset A fib 10/12. -  DC lopressor. 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 Path report indicates lymph involvement.  - Not a candidate for oncologic interventions at this time - Needs  further staging if able to improve.  - Palliative consulted  AKI: Creatinine creeping up. 1.4 today.  Hypokalemia Hypernatremia. - D5W at 50 ml/hr and free water - f/u BMET today   Anemia of critical illness. Now with acute GIB from ostomy 10/14 - f/u CBC - transfuse for Hb < 7 or significant bleeding - Hold heparin - check CBC this afternoon.   Acute metabolic encephalopathy Recent w/u for peripheral neuropathy with concern for Sjogren's syndrome - RASS goal 0 to -1 - Drips to PRN - DC Neurontin  Family meeting today at 5 regarding goals of care.    Best practice:  Diet: tube feeds DVT prophylaxis: SCDs GI prophylaxis: protonix Mobility: Bed Code Status: Full Code   Disposition: ICU  Labs    CMP Latest Ref Rng & Units 04/10/2019 04/09/2019 04/09/2019  Glucose 70 - 99 mg/dL 191(H) 243(H) 180(H)  BUN 8 - 23 mg/dL 59(H) 58(H) 54(H)  Creatinine 0.61 - 1.24 mg/dL 1.40(H) 1.38(H) 1.29(H)  Sodium 135 - 145 mmol/L 147(H) 147(H) 151(H)  Potassium 3.5 - 5.1 mmol/L 3.5 3.5 2.8(L)  Chloride 98 - 111 mmol/L 125(H) 123(H) 124(H)  CO2 22 - 32 mmol/L 18(L) 17(L) 20(L)  Calcium 8.9 - 10.3 mg/dL 8.0(L) 8.0(L) 8.2(L)  Total Protein 6.5 - 8.1 g/dL - - -  Total Bilirubin 0.3 - 1.2 mg/dL - - -  Alkaline Phos 38 - 126 U/L - - -  AST 15 - 41 U/L - - -  ALT 0 - 44 U/L - - -   CBC Latest Ref Rng & Units 04/11/2019 04/10/2019 04/10/2019  WBC 4.0 - 10.5 K/uL 12.0(H) - 15.3(H)  Hemoglobin 13.0 - 17.0 g/dL 7.2(L) 8.0(L) 7.1(L)  Hematocrit 39.0 - 52.0 % 22.3(L) 24.6(L) 20.9(L)  Platelets 150 - 400 K/uL 297 - 283   ABG    Component Value Date/Time   PHART 7.391 04/04/2019 2214   PCO2ART 33.3 04/04/2019 2214   PO2ART 66.0 (L) 04/04/2019 2214   HCO3 20.0 04/04/2019 2214   TCO2 21 (L) 04/04/2019 2214   ACIDBASEDEF 4.0 (H) 04/04/2019 2214   O2SAT 92.0 04/04/2019 2214   CBG (last 3)  Recent Labs    04/10/19 2308 04/11/19 0320 04/11/19 0737  GLUCAP 160* 115* 112*    CRITICAL  CARE Performed by: Corey Harold   Total critical care time: 35 minutes  Critical care time was exclusive of separately billable procedures and treating other patients.  Critical care was necessary to treat or prevent imminent or life-threatening deterioration.  Critical care was time spent personally by me on the following activities: development of treatment plan with patient and/or surrogate as well as nursing, discussions with consultants, evaluation of patient's response to treatment, examination of patient, obtaining history from patient or surrogate, ordering and performing treatments and interventions, ordering and review of laboratory studies, ordering and review of radiographic studies, pulse oximetry and re-evaluation of patient's condition.    Georgann Housekeeper, AGACNP-BC [place] Pager [phone] or [phone]  04/11/2019 9:34 AM

## 2019-04-11 NOTE — Progress Notes (Signed)
Darryl Johns [id number] 06-May-1935  CARE TEAM:  PCP: Kristie Cowman, MD  Outpatient Care Team: Patient Care Team: Kristie Cowman, MD as PCP - General (Family Medicine) Debara Pickett, Nadean Corwin, MD as Consulting Physician (Cardiology) Michael Boston, MD as Consulting Physician (Colon and Rectal Surgery) Coral Spikes, MD as Consulting Physician (Gastroenterology) Kathrynn Ducking, MD as Consulting Physician (Neurology)  Inpatient Treatment Team: Treatment Team: Attending Provider: Laurin Coder, MD; Rounding Team: Pccm, Md, MD; Registered Nurse: Tenna Child, RN; Consulting Physician: Edison Pace, Md, MD; Consulting Physician: Michael Boston, MD; Registered Nurse: Romona Curls, RN; Registered Nurse: Charmayne Sheer, RN; Registered Nurse: Ernestene Kiel, RN; Utilization Review: Tressie Stalker, RN; Case Manager: Carles Collet, RN; Social Worker: Faye Ramsay, LCSW   Problem List:   Principal Problem:   Acute ischemia of jejunum s/p SB resection 04/19/2019 Active Problems:   Cancer of transverse colon pT4b, 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   Protein-calorie malnutrition, severe   Lung mass - primary vs colon metastasis   Essential hypertension   RBBB   Dizziness   Hypotension   Peripheral neuropathy   Gait abnormality   Sepsis ([place])   HOH (hard of hearing)   Coffee ground emesis   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[place])   Shock ([place])   Cachexia ([place])   Elevated carcinoembryonic antigen (CEA)   Recent unintentional weight loss over several months   Hemorrhagic shock ([place])   AKI (acute kidney injury) ([place])   Emphysema of lung ([place])   Coagulopathy ([place])   Colon bleed requiring more than 4 units of blood in 24 hours, ICU, or surgery   Generalized anxiety disorder   Acute respiratory distress   Aspiration into airway   Acute respiratory failure with hypoxia ([place])   Pressure injury of skin   Left ventricular diastolic dysfunction with preserved systolic function  Anemia due to GI blood loss   Anemia of chronic disease   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  Suspicious for possible microperforation but no feculent contamination.  Moderate ascites.  Proximal jejunum just distal ligament of Treitz x2 feet twisted around to the colon tumor and trapped by omentum causing a volvulus and closed-loop type obstruction.  Distal jejunum with edema.  Ileum without inflammation. Patient has a side-to-side staple anastomosis of the very proximal jejunum at the ligament of Treitz to mid jejunum isoperistaltic.  Proximal colon including tumor excised en bloc with proximal jejunum.  End ileostomy in right supraumbilical paramedian region.  Prolene sutures on the proximal Hartmann staple line at the distal transverse colon near the splenic flexure.  Hard fibrotic 5 mm nodule on the dome of the right liver.  Wedge biopsy done.  No other evidence of any intraperitoneal carcinomatosis.  PATHOLOGY:  TRANSVERSE COLON CANCER mpT4b, pN1a   A. COLON JEJUNUM, RIGHT PROXIMAL, RESECTION:  - Right hemicolectomy specimen showing 2 separate tumors  1) Invasive poorly differentiated adenocarcinoma, 12 cm, involving  transverse colon, with marked necrosis  - Carcinoma invades through the serosa into  adherent jejunum  - Lymphovascular invasion is present   2) Invasive moderately differentiated adenocarcinoma, 1.6 cm, involving  transverse colon (2.8 cm proximal to  the larger tumor)  - Carcinoma invades into pericolonic soft tissue  - Lymphovascular invasion is present   - Resection margins are negative for carcinoma  - Metastatic carcinoma to one of thirty-six lymph nodes (1/36). See  comment  - Unremarkable appendix  - See oncology table   B. LIVER, NODULE, WEDGE:  - Benign liver parenchyma showing a fibrotic nodule with calcification  - Negative for ova, parasites,granulomas or malignancy   Assessment  Guarded  [facility] Stay = 14 days)  Plan:  Tube feeds at goal.  Continue  Melena with drop in hemoglobin suspicious for GI bleed.  Most likely upper.  Transfuse if hemoglobin less than 7 or shock.  Defer to critical care.  FTT.  Suspect he will need trach/PEG to transition off vent & retry PO with expected poor PO effort.  Maintain full enteral nutrition  Minimal vent settings.  Weaning parameters for critical care.  Weaning trial OK this AM - ?maybe extubate.  I think he would benefit from diuresing.  I agree with critical care is concerns that he may require tracheostomy to gradually wean with trach collar trials  Bump in creatinine but urine output okay.  No severe renal failure.  More aggressive diuresis as tolerated in the setting of hypernatremia and borderline hemoglobin challenging.  PPI per NGT.    Low K, Phos  & Mg - replacing as needed.  K low w diuresis.  Schedule K for a while & follow  HyperNa - prob w fluid shifts - per CCM.  Guarded against free water replacement setting of being fluid overloaded, but understands worsening hypernatremia and even bigger concern  Ileostomy care.  Usually patients in the long-term developed high output ileostomy requiring a antidiarrheal regimen of psyllium, iron, & as needed Imodium or Lomotil.  Will start w loperamide BID & follow.  Add iron.  We will have to see how this plays out.  Sometimes people need a PICC line and IV fluids for 6 weeks to avoid dehydration.  We will see  Anemia -  acute GI losses & chronic disease.  Add enteral iron.  Will help w high output ileostomy as well.  Wound VAC is on the SQ.  Change Monday Wednesday Friday.  Can switch to packing if closed down.  Initially request to get a packing only over the weekend but now they wish to continue it this week.  Pathology:  Adenocarcinoma of transverse colon invading into the jejunum.  Stage III.  Ideally would benefit from post adjuvant chemotherapy.  Suspect with his advanced age he would just get post-adjuvant oral capecitabine/Xeloda.  Clearly we need to wait to see how he will do with the emergency surgery.  Dr. Burr Medico aware and will follow up with him as an outpatient.  No need to continue IV antibiotics from a surgical standpoint.  He did not have frank severe peritonitis and is been 72 hours since surgery.  Defer to critical care/medicine if they feel he needs it for pulmonary or other concerns.    35 minutes spent in review, evaluation, examination, counseling, and coordination of care.  More than 50% of that time was spent in counseling.     04/11/2019    Subjective: (Chief complaint)  Melena and dark NG tube output dropping hemoglobin suspicious for GI bleed.  No hypotension.  Still deconditioned and  not trying weaning trials well  Objective:  Vital signs:  Vitals:   04/11/19 0400 04/11/19 0500 04/11/19 0600 04/11/19 0700  BP: (!) 98/44 (!) 94/47 (!) 98/42 (!) 102/47  Pulse: 90 88 83 89  Resp: (!) 32 (!) 37 (!) 35 (!) 36  Temp: 98.3 F (36.8 C)     TempSrc: Oral     SpO2: 100% 100% 100% 98%  Weight:      Height:        Last BM Date: 04/11/19  Intake/Output   Yesterday:  10/14 0701 - 10/15 0700 In: 1956.5 [I.V.:1196.5; Blood:315; NG/GT:95; IV Piggyback:50] Out: 2683 [Urine:1075; Emesis/NG output:1100; Stool:300] This shift:  No intake/output data recorded.  Bowel function:  Flatus: YES  BM:  YES -some dark brown stool.  Almost black.  Drain: (No drain)   Physical  Exam:  General: Pt awakens in no acute distress.  Moderate anasarca Eyes: PERRL, normal EOM.  Sclera clear.  No icterus Neuro: CN II-XII intact w/o focal sensory/motor deficits. Lymph: No head/neck/groin lymphadenopathy Psych:  No delerium/psychosis/paranoia HENT: Normocephalic, Mucus membranes moist.  No thrush.   NG tube in place.  ET tube in place Neck: Supple, No tracheal deviation Chest: No chest wall pain w good excursion CV:  Pulses intact.  Regular rhythm MS: Normal AROM mjr joints.  No obvious deformity  Abdomen: Soft.  Nondistended.  Nontender.  Ileostomy right upper quadrant pink and edematous. Thin effluent in bag.  Midline incision with weak granulation tissue but clean.  More superficial.  Just being packed at this point.  No evidence of peritonitis.  No incarcerated hernias.  Ext:  2-3+ edema, especially both hands..  No deformity.  No mjr edema.  No cyanosis Skin: No petechiae / purpura  Results:   PATHOLOGY DIAGNOSIS:   A. COLON JEJUNUM, RIGHT PROXIMAL, RESECTION:  - Right hemicolectomy specimen showing 2 separate tumors  1) Invasive poorly differentiated adenocarcinoma, 12 cm, involving  transverse colon, with marked necrosis  - Carcinoma invades through the serosa into adherent jejunum  - Lymphovascular invasion is present   2) Invasive moderately differentiated adenocarcinoma, 1.6 cm, involving  transverse colon (2.8 cm proximal to the larger tumor)  - Carcinoma invades into pericolonic soft tissue  - Lymphovascular invasion is present   - Resection margins are negative for carcinoma  - Metastatic carcinoma to one of thirty-six lymph nodes (1/36). See  comment  - Unremarkable appendix  - See oncology table   B. LIVER, NODULE, WEDGE:  - Benign liver parenchyma showing a fibrotic nodule with calcification  - Negative for ova, parasites,granulomas or malignancy    COMMENT:   The histomorphology of metastatic carcinoma in the lymph node is   suggestive of metastasis from the smaller, moderately differentiated  adenocarcinoma.    ONCOLOGY TABLE:   COLON AND RECTUM: Resection, Including Transanal Disk Excision of  Rectal Neoplasms   Procedure: Resection, partial right colon and proximal jejunum  Tumor Site: Transverse colon  Tumor Size: 12.0 cm  Macroscopic Tumor Perforation: Not identified  Histologic Type: Adenocarcinoma  Histologic Grade: G3: Poorly differentiated  Tumor Extension: Tumor invades through serosa into adherent jejunum  Margins: Uninvolved by tumor  Treatment Effect: No known presurgical therapy  Lymphovascular Invasion: Present  Perineural Invasion: Not identified  Tumor Deposits: Not identified  Regional Lymph Nodes:    Number of Lymph Nodes Involved: 1    Number of Lymph Nodes Examined: 36  Pathologic Stage Classification (pTNM, AJCC 8th Edition): mpT4b, pN1a  Ancillary Studies: MMR /  MSItesting will be ordered.  Representative Tumor Block: A8  Comments: A separate, invasive moderately differential adenocarcinoma  measuring 1.6 cm was identified 2.8 cm proximal to the larger tumor   ([id number])      Clea Dubach DESCRIPTION:   A: Specimen: Received fresh labeled right colon and proximal jejunum  Specimen integrity: Specimen consists of an unoriented segment of small  intestine with 2 stapled resection margins, and attached portion of  colon (including terminal ileum, right colon, ascending, hepatic flexure  and portion of transverse colon)  Specimen length: Separate segment of jejunum measures 57.5 cm in length,  and the colon specimen displays 3.0 cm of terminal ileum and 45 cm of  colon.  Mesorectal intactness: Nonapplicable  Tumor location: Within the transverse colon, and extending through the  wall into the adjacent adherent portion of jejunum. Possible second  lesion identified, see mucosal polyp heading.  Tumor size: There is a 12.0 x 8.1 cm tan-gray, firm, circumferential  mass  within the distal portion of the colon. The lesion appears to  invade through the wall, and extends into the lumen of the attached  additional portion of jejunum. Adipose tissue surrounding the jejunum  is indurated and slightly nodular.  Percent of bowel circumference involved: 100% of colon circumference,  and approximately 50% of small bowel circumference  Tumor distance to margins:            Proximal: 28.0 cm to the terminal ileum resection  margin            Distal: 5.3 cm from the distal colon resection  margin            Mesenteric (sigmoid and transverse): The serosa and  adipose tissue underlying the bulky colon tumor is puckered and  indurated. The indurated tissue measures approximately 1.9 cm from the  presumed mesenteric margin.            Radial (posterior ascending, posterior descending;  lateral and posterior mid-rectum; and entire lower 1/3 rectum):  Nonapplicable  Macroscopic extent of tumor invasion: Sectioning through the flattened  polyp proximal to the primary lesion reveals a tan-white solid cut  surface, which grossly extends into the muscularis propria and possible  adipose tissue. Sectioning through the primary colon lesion reveals a  tan-white to red cut surface, invading through the muscularis propria  into the surrounding adipose tissue.  Total presumed lymph nodes: 36 tan-gray possible lymph nodes are  identified, ranging from 0.2 cm to 1.4 x 1.0 0.7 cm.  Extramural satellite tumor nodules: None identified  Mucosal polyp(s): 2.8 cm proximal to the main colon lesion there is a  1.6 x 1.3 x 0.2 cm tan-brown, firm, flattened possible secondary lesion  identified. No additional polyps are identified.  Additional findings: Tumor invading into the jejunum measures 23.5 cm to  the closest margin and 33.0 cm from the opposing small bowel margin.  The segment of jejunum is partially twisted around the indurated  adipose  tissue. The uninvolved colonic mucosa is tan-gray with normal folding.  The appendix is present, measures 6.0 cm in length and up to 0.9 cm in  diameter. The serosal surface is tan-gray and smooth, the mucosa is  tan, the wall measures up to 0.2 cm in thickness, the lumen is  moderately dilated and filled with an abundant amount of tan-yellow  fecal material. The indurated and puckered serosa and adipose tissue  underlying the bulky tumor is inked black. The mucosa of the uninvolved  small bowel is  tan-gray, slightly dusky and focally edematous.  Block summary:  1 = terminal ileum resection margin  2 = distal colon resection margin  3 = jejunal margin closest to lesion  4 = jejunal margin distal to lesion  5-7 = smaller, secondary colon lesion  8 and 9 = cross-section from colon to jejunum  10-14 = representative sections of primary lesion  15 = grossly uninvolved mucosa  16 = appendix  17 and 18 = 5 possible lymph nodes, each  19 and 20 = 4 possible lymph nodes, each  21-23 = 3 possible lymph nodes, each  24 and 25 = 2 possible lymph nodes, each  26 = 2 possible lymph nodes, differentially inked and bisected  27-29 = 1 possible lymph node bisected, each  30 = tissue for molecular studies   B: The specimen is received fresh and consists of a 1.6 x 1.6 x 0.5 cm  wedge portion of tan-pink, nodular soft tissue. The cut surface  displays a 0.3 cm in greatest dimension tan-white, firm, calcified  nodule. The specimen is bisected and entirely submitted in 1 cassette  following decalcification.      Final Diagnosis performed by Jaquita Folds, MD.  Electronically  signed 04/01/2019  Technical and / or Professional components performed at [facility]. [facility], [place] [address] [place], [place] [address].  Immunohistochemistry Technical component (if applicable) was performed  at [facility]. [address] [place], [place]  [address].  IMMUNOHISTOCHEMISTRY DISCLAIMER (if applicable):  Some of these immunohistochemical stains may have been developed and the  performance characteristics determine by [facility] Some  may not have been cleared or approved by the U.S. Food and Drug  Administration. The FDA has determined that such clearance or approval  is not necessary. This test is used for clinical purposes. It should not  be regarded as investigational or for research. This laboratory is  certified under the [place]  (CLIA-88) as qualified to perform high complexity clinical laboratory  testing. The controls stained appropriately.   Cultures: Recent Results (from the past 720 hour(s))  SARS Coronavirus 2 [facility] order, Performed in [facility] hospital lab) Nasopharyngeal Nasopharyngeal Swab     Status: None   Collection Time: 04/15/2019  4:03 PM   Specimen: Nasopharyngeal Swab  Result Value Ref Range Status   SARS Coronavirus 2 NEGATIVE NEGATIVE Final    Comment: (NOTE) If result is NEGATIVE SARS-CoV-2 target nucleic acids are NOT DETECTED. The SARS-CoV-2 RNA is generally detectable in upper and lower  respiratory specimens during the acute phase of infection. The lowest  concentration of SARS-CoV-2 viral copies this assay can detect is 250  copies / mL. A negative result does not preclude SARS-CoV-2 infection  and should not be used as the sole basis for treatment or other  patient management decisions.  A negative result may occur with  improper specimen collection / handling, submission of specimen other  than nasopharyngeal swab, presence of viral mutation(s) within the  areas targeted by this assay, and inadequate number of viral copies  (<250 copies / mL). A negative result must be combined with clinical  observations, patient history, and epidemiological information. If result is POSITIVE SARS-CoV-2 target nucleic acids are DETECTED. The  SARS-CoV-2 RNA is generally detectable in upper and lower  respiratory specimens dur ing the acute phase of infection.  Positive  results are indicative of active infection with SARS-CoV-2.  Clinical  correlation with patient history and other diagnostic information is  necessary to determine patient infection status.  Positive results do  not rule out bacterial infection or co-infection with other viruses. If result is PRESUMPTIVE POSTIVE SARS-CoV-2 nucleic acids MAY BE PRESENT.   A presumptive positive result was obtained on the submitted specimen  and confirmed on repeat testing.  While 2019 novel coronavirus  (SARS-CoV-2) nucleic acids may be present in the submitted sample  additional confirmatory testing may be necessary for epidemiological  and / or clinical management purposes  to differentiate between  SARS-CoV-2 and other Sarbecovirus currently known to infect humans.  If clinically indicated additional testing with an alternate test  methodology [phone]) is advised. The SARS-CoV-2 RNA is generally  detectable in upper and lower respiratory sp ecimens during the acute  phase of infection. The expected result is Negative. Fact Sheet for Patients:  StrictlyIdeas.no Fact Sheet for Healthcare Providers: BankingDealers.co.za This test is not yet approved or cleared by the [place] FDA and has been authorized for detection and/or diagnosis of SARS-CoV-2 by FDA under an Emergency Use Authorization (EUA).  This EUA will remain in effect (meaning this test can be used) for the duration of the COVID-19 declaration under Section 564(b)(1) of the Act, 21 U.S.C. section 360bbb-3(b)(1), unless the authorization is terminated or revoked sooner. Performed at [facility], [place] [address], [place], [place] [address]   Blood culture (routine x 2)     Status: None   Collection Time: 04/20/2019  7:55 PM   Specimen: BLOOD LEFT HAND   Result Value Ref Range Status   Specimen Description BLOOD LEFT HAND  Final   Special Requests   Final    BOTTLES DRAWN AEROBIC AND ANAEROBIC Blood Culture adequate volume   Culture   Final    NO GROWTH 5 DAYS Performed at [facility], [place] [address], [place], [place] [address]    Report Status 04/02/2019 FINAL  Final  Blood culture (routine x 2)     Status: None   Collection Time: 04/03/2019  8:04 PM   Specimen: BLOOD RIGHT HAND  Result Value Ref Range Status   Specimen Description BLOOD RIGHT HAND  Final   Special Requests   Final    BOTTLES DRAWN AEROBIC ONLY Blood Culture results may not be optimal due to an inadequate volume of blood received in culture bottles   Culture   Final    NO GROWTH 5 DAYS Performed at [facility], [place] [address] [place], [place] [address]    Report Status 04/02/2019 FINAL  Final  Urine culture     Status: None   Collection Time: 04/19/2019 12:35 AM   Specimen: Urine, Catheterized  Result Value Ref Range Status   Specimen Description URINE, CATHETERIZED  Final   Special Requests NONE  Final   Culture   Final    NO GROWTH Performed at [facility], [place] [address], [place], [place] [address]    Report Status 04/06/2019 FINAL  Final  MRSA PCR Screening     Status: None   Collection Time: 04/26/2019 12:39 PM   Specimen: Nasal Mucosa; Nasopharyngeal  Result Value Ref Range Status   MRSA by PCR NEGATIVE NEGATIVE Final    Comment:        The GeneXpert MRSA Assay (FDA approved for NASAL specimens only), is one component of a comprehensive MRSA colonization surveillance program. It is not intended to diagnose MRSA infection nor to guide  or monitor treatment for MRSA infections. Performed at [facility], [place] [address], [place], [place] [address]   Culture, respiratory (non-expectorated)     Status: None   Collection Time: 03/31/19 11:20 AM   Specimen: Tracheal Aspirate; Respiratory  Result Value Ref Range Status    Specimen Description TRACHEAL ASPIRATE  Final   Special Requests NONE  Final   Gram Stain   Final    RARE WBC PRESENT, PREDOMINANTLY PMN RARE YEAST Performed at [facility], [place] [address] [place], [place] [address]    Culture FEW CANDIDA ALBICANS  Final   Report Status 04/02/2019 FINAL  Final  Culture, bal-quantitative     Status: Abnormal   Collection Time: 04/04/19  8:49 PM   Specimen: Bronchoalveolar Lavage; Respiratory  Result Value Ref Range Status   Specimen Description BRONCHIAL ALVEOLAR LAVAGE  Final   Special Requests NONE  Final   Gram Stain   Final    NO WBC SEEN RARE BUDDING YEAST SEEN Performed at [facility], [address] [place], [place] [address]    Culture 60,000 COLONIES/mL CANDIDA ALBICANS (A)  Final   Report Status 04/06/2019 FINAL  Final    Labs: Results for orders placed or performed during the hospital encounter of 04/22/2019 (from the past 48 hour(s))  Heparin level (unfractionated)     Status: None   Collection Time: 04/09/19 12:00 PM  Result Value Ref Range   Heparin Unfractionated 0.35 0.30 - 0.70 IU/mL    Comment: (NOTE) If heparin results are below expected values, and patient dosage has  been confirmed, suggest follow up testing of antithrombin III levels. Performed at [facility], [place] [address] [place], [place] [address]   Glucose, capillary     Status: Abnormal   Collection Time: 04/09/19 12:08 PM  Result Value Ref Range   Glucose-Capillary 212 (H) 70 - 99 mg/dL  Glucose, capillary     Status: Abnormal   Collection Time: 04/09/19  3:08 PM  Result Value Ref Range   Glucose-Capillary 170 (H) 70 - 99 mg/dL  Glucose, capillary     Status: Abnormal   Collection Time: 04/09/19  7:35 PM  Result Value Ref Range   Glucose-Capillary 178 (H) 70 - 99 mg/dL   Comment 1 Notify RN   BMET today at 2000     Status: Abnormal   Collection Time: 04/09/19  8:00 PM  Result Value Ref Range   Sodium 147 (H) 135 - 145 mmol/L    Potassium 3.5 3.5 - 5.1 mmol/L   Chloride 123 (H) 98 - 111 mmol/L   CO2 17 (L) 22 - 32 mmol/L   Glucose, Bld 243 (H) 70 - 99 mg/dL   BUN 58 (H) 8 - 23 mg/dL   Creatinine, Ser 1.38 (H) 0.61 - 1.24 mg/dL   Calcium 8.0 (L) 8.9 - 10.3 mg/dL   GFR calc non Af Amer 47 (L) >60 mL/min   GFR calc Af Amer 55 (L) >60 mL/min   Anion gap 7 5 - 15    Comment: Performed at [place] [address], [place], [place] [address]  Glucose, capillary     Status: Abnormal   Collection Time: 04/10/19 12:05 AM  Result Value Ref Range   Glucose-Capillary 192 (H) 70 - 99 mg/dL   Comment 1 Notify RN   Glucose, capillary     Status: Abnormal   Collection Time: 04/10/19  3:35 AM  Result Value Ref  Range   Glucose-Capillary 186 (H) 70 - 99 mg/dL   Comment 1 Notify RN   CBC     Status: Abnormal   Collection Time: 04/10/19  4:23 AM  Result Value Ref Range   WBC 15.3 (H) 4.0 - 10.5 K/uL   RBC 2.51 (L) 4.22 - 5.81 MIL/uL   Hemoglobin 7.1 (L) 13.0 - 17.0 g/dL   HCT 20.9 (L) 39.0 - 52.0 %   MCV 83.3 80.0 - 100.0 fL   MCH 28.3 26.0 - 34.0 pg   MCHC 34.0 30.0 - 36.0 g/dL   RDW 25.2 (H) 11.5 - 15.5 %   Platelets 283 150 - 400 K/uL   nRBC 0.1 0.0 - 0.2 %    Comment: Performed at [facility], [place] [address] [place], [place] [address]  Heparin level (unfractionated)     Status: None   Collection Time: 04/10/19  4:23 AM  Result Value Ref Range   Heparin Unfractionated 0.39 0.30 - 0.70 IU/mL    Comment: (NOTE) If heparin results are below expected values, and patient dosage has  been confirmed, suggest follow up testing of antithrombin III levels. Performed at [facility], [place] [address] [place], [place] [address]   BMET in AM     Status: Abnormal   Collection Time: 04/10/19  4:23 AM  Result Value Ref Range   Sodium 147 (H) 135 - 145 mmol/L   Potassium 3.5 3.5 - 5.1 mmol/L   Chloride 125 (H) 98 - 111 mmol/L   CO2 18 (L) 22 - 32 mmol/L   Glucose, Bld 191 (H) 70 - 99 mg/dL   BUN 59  (H) 8 - 23 mg/dL   Creatinine, Ser 1.40 (H) 0.61 - 1.24 mg/dL   Calcium 8.0 (L) 8.9 - 10.3 mg/dL   GFR calc non Af Amer 46 (L) >60 mL/min   GFR calc Af Amer 54 (L) >60 mL/min   Anion gap 4 (L) 5 - 15    Comment: Performed at [facility] [address] [place], [place] [address]  Magnesium     Status: None   Collection Time: 04/10/19  4:23 AM  Result Value Ref Range   Magnesium 1.8 1.7 - 2.4 mg/dL    Comment: Performed at [facility] [address], [place], [place] [address]  Prepare RBC     Status: None   Collection Time: 04/10/19  4:48 AM  Result Value Ref Range   Order Confirmation      ORDER PROCESSED BY BLOOD BANK Performed at [facility], [place] [address], [place], [place] [address]   Type and screen [place]     Status: None   Collection Time: 04/10/19  5:02 AM  Result Value Ref Range   ABO/RH(D) B POS    Antibody Screen NEG    Sample Expiration 04/13/2019,2359    Unit Number [id number]    Blood Component Type RED CELLS,LR    Unit division 00    Status of Unit ISSUED,FINAL    Transfusion Status OK TO TRANSFUSE    Crossmatch Result      Compatible Performed at [facility], [place] [address], [place], [place] [address]   Glucose, capillary     Status: Abnormal   Collection Time: 04/10/19  7:27 AM  Result Value Ref Range   Glucose-Capillary 134 (H) 70 - 99 mg/dL  Hemoglobin and hematocrit, blood     Status: Abnormal  Collection Time: 04/10/19 10:28 AM  Result Value Ref Range   Hemoglobin 8.0 (L) 13.0 - 17.0 g/dL   HCT 24.6 (L) 39.0 - 52.0 %    Comment: Performed at [facility] [address], [place],  [address]  Protime-INR     Status: Abnormal   Collection Time: 04/10/19 10:28 AM  Result Value Ref Range   Prothrombin Time 16.4 (H) 11.4 - 15.2 seconds   INR 1.3 (H) 0.8 - 1.2    Comment: (NOTE) INR goal varies based on device and disease states. Performed at [facility], [place] [address], [place], [place] [address]   Glucose, capillary     Status: Abnormal   Collection Time: 04/10/19 11:34 AM  Result Value Ref Range   Glucose-Capillary 104 (H) 70 - 99 mg/dL  Glucose, capillary     Status: None   Collection Time: 04/10/19  3:36 PM  Result Value Ref Range   Glucose-Capillary 91 70 - 99 mg/dL  Glucose, capillary     Status: Abnormal   Collection Time: 04/10/19  7:31 PM  Result Value Ref Range   Glucose-Capillary 110 (H) 70 - 99 mg/dL  Glucose, capillary     Status: Abnormal   Collection Time: 04/10/19 11:08 PM  Result Value Ref Range   Glucose-Capillary 160 (H) 70 - 99 mg/dL  CBC     Status: Abnormal   Collection Time: 04/11/19  3:16 AM  Result Value Ref Range   WBC 12.0 (H) 4.0 - 10.5 K/uL   RBC 2.70 (L) 4.22 - 5.81 MIL/uL   Hemoglobin 7.2 (L) 13.0 - 17.0 g/dL   HCT 22.3 (L) 39.0 - 52.0 %   MCV 82.6 80.0 - 100.0 fL   MCH 26.7 26.0 - 34.0 pg   MCHC 32.3 30.0 - 36.0 g/dL   RDW 22.1 (H) 11.5 - 15.5 %   Platelets 297 150 - 400 K/uL   nRBC 0.2 0.0 - 0.2 %    Comment: Performed at [facility], [place]. [address] [place], [place] [address]  Glucose, capillary     Status: Abnormal   Collection Time: 04/11/19  3:20 AM  Result Value Ref Range   Glucose-Capillary 115 (H) 70 - 99 mg/dL    Imaging / Studies: Dg Chest Port 1 View  Result Date: 04/10/2019 CLINICAL DATA:  Respiratory failure. EXAM: PORTABLE CHEST 1 VIEW COMPARISON:  April 08, 2019. FINDINGS: The heart size and mediastinal contours are within normal limits. Endotracheal and nasogastric tubes are unchanged in position. No pneumothorax is noted. Atherosclerosis of thoracic aorta is noted. Stable bilateral lung opacities are noted consistent with edema and associated pleural effusions. The visualized skeletal structures are unremarkable. IMPRESSION: Stable support apparatus.  Stable bilateral lung opacities. Electronically Signed   By: Marijo Conception M.D.   On: 04/10/2019 07:27   Dg Abd Portable 1v   Result Date: 04/10/2019 CLINICAL DATA:  84 year old male with history of laparotomy on 04/18/2019. EXAM: PORTABLE ABDOMEN - 1 VIEW COMPARISON:  No priors. FINDINGS: Enteric tube terminating in the antral pre-pyloric region of the stomach or proximal duodenum. Gas is noted throughout the colon extending to the level of the distal rectum. No pathologic dilatation of small bowel. Density projecting over the right-side of the abdomen, likely a stoma from recent ileostomy. No pneumoperitoneum. IMPRESSION: 1. Nonobstructive bowel gas pattern. 2. Postoperative changes and support apparatus, as above. Electronically Signed   By: Vinnie Langton M.D.   On: 04/10/2019 14:43  Medications / Allergies: per chart  Antibiotics: Anti-infectives (From admission, onward)   Start     Dose/Rate Route Frequency Ordered Stop   04/05/19 1200  ceFEPIme (MAXIPIME) 2 g in sodium chloride 0.9 % 100 mL IVPB     2 g 200 mL/hr over 30 Minutes Intravenous Every 12 hours 04/05/19 1131 04/08/19 2154   03/30/19 0600  cefoTEtan (CEFOTAN) 2 g in sodium chloride 0.9 % 100 mL IVPB    Note to Pharmacy: Pharmacy may adjust dose strength for optimal dosing.   Send with patient on call to the OR.  Anesthesia to complete antibiotic administration <69mn [facility]   2 g 200 mL/hr over 30 Minutes Intravenous To Short Stay 04/21/2019 1335 04/26/2019 1559   04/10/2019 2200  cefoTEtan (CEFOTAN) 2 g in sodium chloride 0.9 % 100 mL IVPB     2 g 200 mL/hr over 30 Minutes Intravenous Every 12 hours 04/14/2019 1902 04/11/2019 2214   03/30/2019 1730  vancomycin (VANCOCIN) 1,250 mg in sodium chloride 0.9 % 250 mL IVPB  Status:  Discontinued     1,250 mg 166.7 mL/hr over 90 Minutes Intravenous Every 24 hours 04/20/2019 1720 04/24/2019 0928   04/17/2019 1400  neomycin (MYCIFRADIN) tablet 1,000 mg  Status:  Discontinued     1,000 mg Oral 3 times per day 03/31/2019 0620 04/20/2019 0839   04/11/2019 1400  metroNIDAZOLE (FLAGYL) tablet 1,000 mg   Status:  Discontinued     1,000 mg Oral 3 times per day 03/31/2019 0620 04/06/2019 0839   03/31/2019 1013  vancomycin variable dose per unstable renal function (pharmacist dosing)  Status:  Discontinued      Does not apply See admin instructions 04/26/2019 1014 04/22/2019 1908   04/04/2019 0930  metroNIDAZOLE (FLAGYL) IVPB 500 mg  Status:  Discontinued     500 mg 100 mL/hr over 60 Minutes Intravenous Every 8 hours 04/16/2019 0920 04/01/19 1054   04/07/2019 0630  cefoTEtan (CEFOTAN) 2 g in sodium chloride 0.9 % 100 mL IVPB  Status:  Discontinued     2 g 200 mL/hr over 30 Minutes Intravenous On call to O.R. 04/04/2019 0034910/26/2020 0839   04/27/2019 0630  clindamycin (CLEOCIN) 900 mg, gentamicin (GARAMYCIN) 240 mg in sodium chloride 0.9 % 1,000 mL for intraperitoneal lavage  Status:  Discontinued      Irrigation To Surgery 04/09/2019 0620 04/11/2019 0840   04/27/2019 0500  ceFEPIme (MAXIPIME) 2 g in sodium chloride 0.9 % 100 mL IVPB  Status:  Discontinued     2 g 200 mL/hr over 30 Minutes Intravenous Every 12 hours 04/17/2019 1720 04/04/19 0459   04/19/2019 1630  ceFEPIme (MAXIPIME) 2 g in sodium chloride 0.9 % 100 mL IVPB     2 g 200 mL/hr over 30 Minutes Intravenous  Once 03/30/2019 1617 04/19/2019 1804   04/03/2019 1630  metroNIDAZOLE (FLAGYL) IVPB 500 mg     500 mg 100 mL/hr over 60 Minutes Intravenous  Once 04/22/2019 1617 04/07/2019 1928   04/05/2019 1630  vancomycin (VANCOCIN) IVPB 1000 mg/200 mL premix  Status:  Discontinued     1,000 mg 200 mL/hr over 60 Minutes Intravenous  Once 04/04/2019 1617 03/30/2019 1626   04/25/2019 1630  vancomycin (VANCOCIN) 1,500 mg in sodium chloride 0.9 % 500 mL IVPB     1,500 mg 250 mL/hr over 120 Minutes Intravenous  Once 04/08/2019 1626 04/17/2019 2157        Note: Portions of this report may have been transcribed using  voice recognition software. Every effort was made to ensure accuracy; however, inadvertent computerized transcription errors may be present.   Any transcriptional errors that  result from this process are unintentional.     Adin Hector, MD, FACS, MASCRS Gastrointestinal and Minimally Invasive Surgery    [address], [place], [place] [address] [phone] Main / Paging [phone] Fax

## 2019-04-12 ENCOUNTER — Inpatient Hospital Stay: Admit: 2019-04-12 | Payer: Medicare HMO | Admitting: Surgery

## 2019-04-12 DIAGNOSIS — Z515 Encounter for palliative care: Secondary | ICD-10-CM

## 2019-04-12 DIAGNOSIS — C184 Malignant neoplasm of transverse colon: Secondary | ICD-10-CM

## 2019-04-12 LAB — CBC WITH DIFFERENTIAL/PLATELET
Abs Immature Granulocytes: 0.25 10*3/uL — ABNORMAL HIGH (ref 0.00–0.07)
Basophils Absolute: 0 10*3/uL (ref 0.0–0.1)
Basophils Relative: 0 %
Eosinophils Absolute: 0.1 10*3/uL (ref 0.0–0.5)
Eosinophils Relative: 1 %
HCT: 23.8 % — ABNORMAL LOW (ref 39.0–52.0)
Hemoglobin: 7.8 g/dL — ABNORMAL LOW (ref 13.0–17.0)
Immature Granulocytes: 2 %
Lymphocytes Relative: 8 %
Lymphs Abs: 1 10*3/uL (ref 0.7–4.0)
MCH: 27.4 pg (ref 26.0–34.0)
MCHC: 32.8 g/dL (ref 30.0–36.0)
MCV: 83.5 fL (ref 80.0–100.0)
Monocytes Absolute: 0.3 10*3/uL (ref 0.1–1.0)
Monocytes Relative: 2 %
Neutro Abs: 10.2 10*3/uL — ABNORMAL HIGH (ref 1.7–7.7)
Neutrophils Relative %: 87 %
Platelets: 362 10*3/uL (ref 150–400)
RBC: 2.85 MIL/uL — ABNORMAL LOW (ref 4.22–5.81)
RDW: 22.5 % — ABNORMAL HIGH (ref 11.5–15.5)
WBC: 11.8 10*3/uL — ABNORMAL HIGH (ref 4.0–10.5)
nRBC: 0 % (ref 0.0–0.2)

## 2019-04-12 LAB — BASIC METABOLIC PANEL
Anion gap: 4 — ABNORMAL LOW (ref 5–15)
BUN: 59 mg/dL — ABNORMAL HIGH (ref 8–23)
CO2: 18 mmol/L — ABNORMAL LOW (ref 22–32)
Calcium: 8.1 mg/dL — ABNORMAL LOW (ref 8.9–10.3)
Chloride: 120 mmol/L — ABNORMAL HIGH (ref 98–111)
Creatinine, Ser: 1.8 mg/dL — ABNORMAL HIGH (ref 0.61–1.24)
GFR calc Af Amer: 40 mL/min — ABNORMAL LOW (ref >60)
GFR calc non Af Amer: 34 mL/min — ABNORMAL LOW (ref >60)
Glucose, Bld: 188 mg/dL — ABNORMAL HIGH (ref 70–99)
Potassium: 3.1 mmol/L — ABNORMAL LOW (ref 3.5–5.1)
Sodium: 142 mmol/L (ref 135–145)

## 2019-04-12 LAB — GLUCOSE, CAPILLARY
Glucose-Capillary: 140 mg/dL — ABNORMAL HIGH (ref 70–99)
Glucose-Capillary: 137 mg/dL — ABNORMAL HIGH (ref 70–99)
Glucose-Capillary: 135 mg/dL — ABNORMAL HIGH (ref 70–99)

## 2019-04-12 SURGERY — COLECTOMY, PARTIAL, ROBOT-ASSISTED, LAPAROSCOPIC
Anesthesia: General

## 2019-04-12 MED ORDER — LOPERAMIDE HCL 2 MG PO CAPS
2.0000 mg | ORAL_CAPSULE | Freq: Two times a day (BID) | ORAL | Status: DC
  Filled 2019-04-12: qty 1

## 2019-04-12 MED ORDER — POLYVINYL ALCOHOL 1.4 % OP SOLN
1.0000 [drp] | Freq: Four times a day (QID) | OPHTHALMIC | Status: DC | PRN
  Administered 2019-04-12: 1 [drp] via OPHTHALMIC
  Filled 2019-04-12: qty 15

## 2019-04-12 MED ORDER — PSYLLIUM 95 % PO PACK
1.0000 | PACK | Freq: Every day | ORAL | Status: DC
  Administered 2019-04-12: 1 via ORAL
  Filled 2019-04-12: qty 1

## 2019-04-12 MED ORDER — GLYCOPYRROLATE 0.2 MG/ML IJ SOLN: 0.2000 mg | INTRAMUSCULAR | Status: DC | PRN

## 2019-04-12 MED ORDER — GLYCOPYRROLATE 1 MG PO TABS: 1.0000 mg | ORAL_TABLET | ORAL | Status: DC | PRN

## 2019-04-12 MED ORDER — LORAZEPAM 2 MG/ML IJ SOLN
1.0000 mg | INTRAMUSCULAR | Status: DC | PRN
  Administered 2019-04-12: 1 mg via INTRAVENOUS
  Filled 2019-04-12: qty 1

## 2019-04-12 MED ORDER — GLYCOPYRROLATE 0.2 MG/ML IJ SOLN
0.2000 mg | INTRAMUSCULAR | Status: DC | PRN
  Administered 2019-04-12: 0.2 mg via INTRAVENOUS
  Filled 2019-04-12: qty 1

## 2019-04-12 MED ORDER — MIDAZOLAM HCL 2 MG/2ML IJ SOLN: 2.0000 mg | INTRAMUSCULAR | Status: DC | PRN

## 2019-04-12 MED ORDER — BIOTENE DRY MOUTH MT LIQD: 15.0000 mL | OROMUCOSAL | Status: DC | PRN

## 2019-04-12 MED ORDER — FREE WATER: 100.0000 mL | Freq: Three times a day (TID) | Status: DC

## 2019-04-12 MED ORDER — ACETAMINOPHEN 325 MG PO TABS: 650.0000 mg | ORAL_TABLET | Freq: Four times a day (QID) | ORAL | Status: DC | PRN

## 2019-04-12 MED ORDER — FERROUS SULFATE 325 (65 FE) MG PO TABS
325.0000 mg | ORAL_TABLET | Freq: Two times a day (BID) | ORAL | Status: DC
  Administered 2019-04-12: 325 mg via ORAL
  Filled 2019-04-12: qty 1

## 2019-04-12 MED ORDER — DEXTROSE 5 % IV SOLN: INTRAVENOUS | Status: DC

## 2019-04-12 MED ORDER — POTASSIUM CHLORIDE 20 MEQ PO PACK
20.0000 meq | PACK | Freq: Two times a day (BID) | ORAL | Status: AC
  Administered 2019-04-12: 10:00:00 20 meq via ORAL
  Filled 2019-04-12: qty 1

## 2019-04-12 MED ORDER — FERROUS SULFATE 300 (60 FE) MG/5ML PO SYRP
300.0000 mg | ORAL_SOLUTION | Freq: Two times a day (BID) | ORAL | Status: DC
  Filled 2019-04-12 (×2): qty 5

## 2019-04-12 MED ORDER — MORPHINE 100MG IN NS 100ML (1MG/ML) PREMIX INFUSION
0.0000 mg/h | INTRAVENOUS | Status: DC
  Administered 2019-04-12: 12:00:00 5 mg/h via INTRAVENOUS
  Administered 2019-04-12: 20:00:00 20 mg/h via INTRAVENOUS
  Filled 2019-04-12 (×2): qty 100

## 2019-04-12 MED ORDER — LOPERAMIDE HCL 1 MG/7.5ML PO SUSP
2.0000 mg | Freq: Two times a day (BID) | ORAL | Status: DC
  Administered 2019-04-12: 2 mg
  Filled 2019-04-12 (×2): qty 15

## 2019-04-12 MED ORDER — HALOPERIDOL LACTATE 2 MG/ML PO CONC
0.5000 mg | ORAL | Status: DC | PRN
  Filled 2019-04-12: qty 0.3

## 2019-04-12 MED ORDER — DIPHENHYDRAMINE HCL 50 MG/ML IJ SOLN: 25.0000 mg | INTRAMUSCULAR | Status: DC | PRN

## 2019-04-12 MED ORDER — POLYVINYL ALCOHOL 1.4 % OP SOLN: 1.0000 [drp] | Freq: Four times a day (QID) | OPHTHALMIC | Status: DC | PRN

## 2019-04-12 MED ORDER — HALOPERIDOL LACTATE 5 MG/ML IJ SOLN: 0.5000 mg | INTRAMUSCULAR | Status: DC | PRN

## 2019-04-12 MED ORDER — ACETAMINOPHEN 650 MG RE SUPP: 650.0000 mg | Freq: Four times a day (QID) | RECTAL | Status: DC | PRN

## 2019-04-12 MED ORDER — LORAZEPAM 2 MG/ML PO CONC: 1.0000 mg | ORAL | Status: DC | PRN

## 2019-04-12 MED ORDER — MORPHINE SULFATE (PF) 2 MG/ML IV SOLN: 2.0000 mg | INTRAVENOUS | Status: DC | PRN

## 2019-04-12 MED ORDER — PSYLLIUM 95 % PO PACK: 1.0000 | PACK | Freq: Every day | ORAL | Status: DC

## 2019-04-12 MED ORDER — LORAZEPAM 1 MG PO TABS: 1.0000 mg | ORAL_TABLET | ORAL | Status: DC | PRN

## 2019-04-12 MED ORDER — LORAZEPAM 2 MG/ML IJ SOLN
0.5000 mg | Freq: Two times a day (BID) | INTRAMUSCULAR | Status: DC
  Administered 2019-04-12: 0.5 mg via INTRAVENOUS
  Filled 2019-04-12: qty 1

## 2019-04-12 MED ORDER — HALOPERIDOL 0.5 MG PO TABS
0.5000 mg | ORAL_TABLET | ORAL | Status: DC | PRN
  Filled 2019-04-12: qty 1

## 2019-04-12 MED ORDER — MORPHINE BOLUS VIA INFUSION
5.0000 mg | INTRAVENOUS | Status: DC | PRN
  Administered 2019-04-12 (×3): 5 mg via INTRAVENOUS
  Filled 2019-04-12: qty 5

## 2019-04-15 ENCOUNTER — Telehealth: Payer: Self-pay | Admitting: Pulmonary Disease

## 2019-04-15 NOTE — Telephone Encounter (Signed)
04/18/2019 - Received signed death certificate from Dr. Ander Slade. [place] to pick up.  2019-04-18 - Received Death Cert from [facility]. Sending to Pulmonary for Dr. Ander Slade to sign.

## 2019-04-25 ENCOUNTER — Ambulatory Visit: Payer: Medicare HMO | Admitting: Hematology

## 2019-04-28 NOTE — Progress Notes (Signed)
Did have a discussion with spouse this morning  Patient will not want to be kept alive on a ventilator  We will not want to have a tracheostomy and PEG  Decision made to make him comfort measures only

## 2019-04-28 NOTE — Progress Notes (Signed)
Attempted wife on her mobile phone with no answer. I did not leave voicemail.

## 2019-04-28 NOTE — Consult Note (Signed)
Consultation Note Date: 05-12-2019   Patient Name: Darryl Johns  DOB: 1934-09-11  MRN: [id number]  Age / Sex: 84 y.o., male  PCP: Kristie Cowman, MD Referring Physician: Laurin Coder, MD  Reason for Consultation: Pain control, Psychosocial/spiritual support, Terminal Care and Withdrawal of life-sustaining treatment  HPI/Patient Profile: 84 y.o. male  with past medical history of right bundle branch block, peripheral neuropathy, Sjogren's syndrome and emphysema who was admitted on 04/20/2019 with hemorrhagic shock secondary to hematemesis and melena.  He was found to have a transverse colon mass and small bowel volvulus.  He required intubation and pressors.  He underwent a right hemicolectomy and colostomy placement.  He progressed to the point where extubation was attempted but unfortunately he had to be quickly reintubated.  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  He has had atrial fibrillation with rapid ventricular response.  He is now off pressors but remains intubated.  He has had difficulty with his hemoglobin dropping, tolerating tube feeds and an inability to wean from the vent..   Clinical Assessment and Goals of Care:  I have reviewed medical records including EPIC notes, labs and imaging, received report from ICU RN and Dr. Ander Slade, assessed the patient and then attempted to call his wife to discuss diagnosis prognosis, GOC, EOL wishes, disposition and options.  In speaking with the attending physician I understand that goals of care have been determined and the plan is for extubation to comfort soon.  I understand that my charge is to support the family, answer their questions, and ensure the patient is comfortable.  Dr. Ander Slade has already placed orders for a morphine drip.  I  attempted to call his wife this morning at approximately 10:00 and again at 1245.  I have left voicemail messages both times.    Primary Decision Maker:  NEXT OF KIN: Wife    SUMMARY OF RECOMMENDATIONS    Orders adjusted to reflect comfort.  Labs discontinued.  Medications minimized. Agree with morphine drip and as needed boluses.  Will add Ativan scheduled and as needed. PMT will continue to attempt to reach the patient's family. PMT will follow with you Chaplain consulted  Code Status/Advance Care Planning:  DNR   Symptom Management:   As above  Additional Recommendations (Limitations, Scope, Preferences):  Full Comfort Care  Palliative Prophylaxis:   Aspiration, Delirium Protocol and Frequent Pain Assessment  Psycho-social/Spiritual:   Desire for further Chaplaincy support: Requested  Prognosis: Hours to days    Discharge Planning: To Be Determined anticipated hospital death versus a possible  transfer to hospice house      Primary Diagnoses: Present on Admission: . Sepsis ([place]) . (Resolved) GI bleed . Essential hypertension . RBBB   I have reviewed the medical record, interviewed the patient and family, and examined the patient. The following aspects are pertinent.  Past Medical History:  Diagnosis Date  . Allergic rhinitis   . Colonic mass   .  Emphysema of lung ([place]) 04/01/2019  . Gait abnormality 09/04/2018  . Hypertension   . Peripheral neuropathy 09/04/2018  . RBBB    Social History   Socioeconomic History  . Marital status: Married    Spouse name: Kennyth Lose  . Number of children: 3  . Years of education: Not on file  . Highest education level: 11th grade  Occupational History  . Occupation: Retired   Scientific laboratory technician  . Financial resource strain: Not on file  . Food insecurity    Worry: Not on file    Inability: Not on file  . Transportation needs    Medical: Not on file    Non-medical: Not on file  Tobacco Use  . Smoking status:  Former Research scientist (life sciences)  . Smokeless tobacco: Never Used  Substance and Sexual Activity  . Alcohol use: Yes  . Drug use: No  . Sexual activity: Not on file  Lifestyle  . Physical activity    Days per week: Not on file    Minutes per session: Not on file  . Stress: Not on file  Relationships  . Social Herbalist on phone: Not on file    Gets together: Not on file    Attends religious service: Not on file    Active member of club or organization: Not on file    Attends meetings of clubs or organizations: Not on file    Relationship status: Not on file  Other Topics Concern  . Not on file  Social History Narrative   Right handed   2 cups daily of caffeine    Lives at home with wife    Has children.   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  Former smoker no alcohol now   Family History  Problem Relation Age of Onset  . Stroke Mother   . Lung cancer Brother   . Ovarian cancer Sister   . Cancer Brother        Possibly colon cancer   Scheduled Meds: . chlorhexidine gluconate (MEDLINE KIT)  15 mL Mouth Rinse BID  . Chlorhexidine Gluconate Cloth  6 each Topical Daily  . ferrous sulfate  300 mg Per Tube BID  . lip balm  1 application Topical BID  . loperamide HCl  2 mg Per Tube Q12H  . LORazepam  0.5 mg Intravenous BID  . mouth rinse  15 mL Mouth Rinse 10 times per day  . pantoprazole (PROTONIX) IV  40 mg Intravenous Q12H   Continuous Infusions: . dextrose Stopped (2019-05-08 1053)  . morphine 5 mg/hr (May 08, 2019 1146)   PRN Meds:.acetaminophen **OR** acetaminophen, albuterol, antiseptic oral rinse, diphenhydrAMINE, glycopyrrolate **OR** glycopyrrolate **OR** glycopyrrolate, haloperidol **OR** haloperidol **OR** haloperidol lactate, hydrocortisone, hydrocortisone cream, LORazepam **OR** LORazepam **OR** LORazepam, metoprolol tartrate, midazolam, morphine injection, morphine, polyvinyl alcohol No Known Allergies Review of Systems patient is intubated and unable to speak Physical  Exam  Thin frail gentleman, he is awake, intubated, with an NG tube in place.  No apparent distress He is able to nod his head yes and no Cardiovascular regular rate and rhythm Respiratory: Currently intubated no acute distress, no wheezes crackles or rales Abdomen: Thin, soft, central abdomen is bandaged.  Bandage is clean dry and intact, ostomy appears healthy with no swelling or inappropriate drainage  Vital Signs: BP 116/63   Pulse 92   Temp (!) 97.3 F (36.3 C) (Axillary) Comment: warm blankets applied  Resp (!) 25   Ht '6\' 2"'$  (1.88 m)   Wt  74.6 kg   SpO2 100%   BMI 21.12 kg/m  Pain Scale: CPOT   Pain Score: 1    SpO2: SpO2: 100 % O2 Device:SpO2: 100 % O2 Flow Rate: .O2 Flow Rate (L/min): 40 L/min  IO: Intake/output summary:   Intake/Output Summary (Last 24 hours) at May 01, 2019 1245 Last data filed at 2019-05-01 1100 Gross per 24 hour  Intake 2972.39 ml  Output 3750 ml  Net -777.61 ml    LBM: Last BM Date: May 01, 2019 Baseline Weight: Weight: 70 kg Most recent weight: Weight: 74.6 kg     Palliative Assessment/Data: 20%     Time In: 11:00 Time Out: 11:50 Time Total: 50 minutes Visit consisted of counseling and education dealing with the complex and emotionally intense issues surrounding the need for palliative care and symptom management in the setting of serious and potentially life-threatening illness. Greater than 50%  of this time was spent counseling and coordinating care related to the above assessment and plan.  Signed by: Florentina Jenny, PA-C Palliative Medicine Pager: [phone]  Please contact Palliative Medicine Team phone at [phone] for questions and concerns.  For individual provider: See Shea Evans

## 2019-04-28 NOTE — Progress Notes (Signed)
Darryl Johns [id number] 01-08-1935  CARE TEAM:  PCP: Kristie Cowman, MD  Outpatient Care Team: Patient Care Team: Kristie Cowman, MD as PCP - General (Family Medicine) Debara Pickett, Nadean Corwin, MD as Consulting Physician (Cardiology) Michael Boston, MD as Consulting Physician (Colon and Rectal Surgery) Coral Spikes, MD as Consulting Physician (Gastroenterology) Kathrynn Ducking, MD as Consulting Physician (Neurology)  Inpatient Treatment Team: Treatment Team: Attending Provider: Laurin Coder, MD; Rounding Team: Pccm, Md, MD; Registered Nurse: Tenna Child, RN; Consulting Physician: Nolon Nations, MD; Consulting Physician: Michael Boston, MD; Registered Nurse: Romona Curls, RN; Registered Nurse: Charmayne Sheer, RN; Registered Nurse: Ernestene Kiel, RN; Registered Nurse: Jackalyn Lombard, RN; Registered Nurse: Geni Bers, RN   Problem List:   Principal Problem:   Acute ischemia of jejunum s/p SB resection 04/24/2019 Active Problems:   Cancer of transverse colon pT4b, 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   Protein-calorie malnutrition, severe   Lung mass - primary vs colon metastasis   Essential hypertension   RBBB   Dizziness   Hypotension   Peripheral neuropathy   Gait abnormality   Sepsis ([place])   HOH (hard of hearing)   Coffee ground emesis   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[place])   Shock ([place])   Cachexia ([place])   Elevated carcinoembryonic antigen (CEA)   Recent unintentional weight loss over several months   Hemorrhagic shock ([place])   AKI (acute kidney injury) ([place])   Emphysema of lung ([place])   Coagulopathy ([place])   Colon bleed requiring more than 4 units of blood in 24 hours, ICU, or surgery   Generalized anxiety disorder   Acute respiratory distress   Aspiration into airway   Acute respiratory failure with hypoxia ([place])   Pressure injury of skin   Left ventricular diastolic dysfunction with preserved systolic function   Anemia due to GI blood loss  Anemia of chronic disease   High output ileostomy ([place])   14 Days Post-Op  04/24/2019  POST-OPERATIVE DIAGNOSIS:   Bulky transverse colon mass.  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  Suspicious for possible microperforation but no feculent contamination.  Moderate ascites.  Proximal jejunum just distal ligament of Treitz x2 feet twisted around to the colon tumor and trapped by omentum causing a volvulus and closed-loop type obstruction.  Distal jejunum with edema.  Ileum without inflammation. Patient has a side-to-side staple anastomosis of the very proximal jejunum at the ligament of Treitz to mid jejunum isoperistaltic.  Proximal colon including tumor excised en bloc with proximal jejunum.  End ileostomy in right supraumbilical paramedian region.  Prolene sutures on the proximal Hartmann staple line at the distal transverse colon near the splenic flexure.  Hard fibrotic 5 mm nodule on the dome of the right liver.  Wedge biopsy done.  No other evidence of any intraperitoneal carcinomatosis.  PATHOLOGY:  TRANSVERSE COLON CANCER mpT4b, pN1a   A. COLON JEJUNUM, RIGHT PROXIMAL, RESECTION:  - Right hemicolectomy specimen showing 2 separate tumors  1) Invasive poorly differentiated adenocarcinoma, 12 cm, involving  transverse colon, with marked necrosis  - Carcinoma invades through the serosa into adherent jejunum  - Lymphovascular  invasion is present   2) Invasive moderately differentiated adenocarcinoma, 1.6 cm, involving  transverse colon (2.8 cm proximal to the larger tumor)  - Carcinoma  invades into pericolonic soft tissue  - Lymphovascular invasion is present   - Resection margins are negative for carcinoma  - Metastatic carcinoma to one of thirty-six lymph nodes (1/36). See  comment  - Unremarkable appendix  - See oncology table   B. LIVER, NODULE, WEDGE:  - Benign liver parenchyma showing a fibrotic nodule with calcification  - Negative for ova, parasites,granulomas or malignancy   Assessment  Guarded  [facility] Stay = 15 days)  Plan:  Tube feeds at goal.  Continue  Melena with drop in hemoglobin suspicious for GI bleed.  Resolved.  Most likely upper.  PPI BID Hgb more stable.  Transfuse if hemoglobin less than 7 or shock.  Defer to critical care.  FTT.  He will need trach/PEG to transition off vent & retry PO with expected poor PO effort.  Maintain full enteral nutrition  Minimal vent settings.  Weaning parameters for critical care.  Weaning trial OK this AM - ?maybe extubate.  I think he would benefit from diuresing.  I agree with critical care is concerns that he may require tracheostomy to gradually wean with trach collar trials  Bump in creatinine but urine output okay.  Auto diuresing.  Rising creatinine.  Follow closely.  No severe renal failure.  More aggressive diuresis as tolerated in the setting of hypernatremia, higher ileostomy output and borderline hemoglobin challenging.  Low K, Phos  & Mg - replacing as needed.  K low w diuresis.  Schedule K for a while & follow  HyperNa - prob w fluid shifts - per CCM.  Guarded against free water replacement setting of being fluid overloaded, but understand worsening hypernatremia an even bigger concern  Ileostomy care.  High output ileostomy not surprising in this malnourished man.   I reordered loperamide twice daily, switch iron to tablet twice daily since liquid may have sorbitol diarrhea affect, resume psyllium daily.  Thicken up and slow down the ileostomy succus/effluent.  We will have to see how this  plays out.  Sometimes people need a PICC line and IV fluids for 6 weeks to avoid dehydration.  We will see  Anemia - acute GI losses & chronic disease.  Add enteral iron.  Consider IV iron as well versus transfusion.  Defer to critical care will help w high output ileostomy as well.  Pathology:  Adenocarcinoma of transverse colon invading into the jejunum.  Stage III.  Ideally would benefit from post adjuvant chemotherapy.  Suspect with his advanced age he would just get post-adjuvant oral capecitabine/Xeloda.  Clearly we need to wait to see how he will do with the emergency surgery.  Dr. Burr Medico aware and will follow up with him as an outpatient.  No need to continue IV antibiotics from a surgical standpoint.  He did not have frank severe peritonitis and is been 72 hours since surgery.  Defer to critical care/medicine if they feel he needs it for pulmonary or other concerns.    40 minutes spent in review, evaluation, examination, counseling, and coordination of care.  More than 50% of that time was spent in counseling.     05/07/19    Subjective: (Chief complaint)  Remains on minimal vent settings.  Still rather weak and not tolerating weaning trials well.  ICU nurse in room.  Increased ileostomy output.  Back to brown.  No more  melena.  Family meeting supposedly later today  Objective:  Vital signs:  Vitals:   05-12-2019 0400 2019/05/12 0421 May 12, 2019 0500 12-May-2019 0600  BP: (!) 117/47  (!) 118/45 (!) 112/47  Pulse: 98  93 97  Resp: (!) 32  (!) 27 (!) 31  Temp:  98.4 F (36.9 C)    TempSrc:  Axillary    SpO2: 100%  100% 100%  Weight:  74.6 kg    Height:        Last BM Date: 2019/05/12  Intake/Output   Yesterday:  10/15 0701 - 10/16 0700 In: 2460.8 [I.V.:1145.8; NG/GT:1315] Out: 0165 [Urine:2335; Stool:1125] This shift:  No intake/output data recorded.  Bowel function:  Flatus: YES  BM:  YES -light brown watery succus effluent out ileostomy  Drain: (No drain)    Physical Exam:  General: Pt awakens in no acute distress.  Following commands.  Nods yes or no.  Moderate anasarca Eyes: PERRL, normal EOM.  Sclera clear.  No icterus Neuro: CN II-XII intact w/o focal sensory/motor deficits. Lymph: No head/neck/groin lymphadenopathy Psych:  No delerium/psychosis/paranoia HENT: Normocephalic, Mucus membranes moist.  No thrush.   NG tube in place.  ET tube in place Neck: Supple, No tracheal deviation Chest: No chest wall pain w good excursion CV:  Pulses intact.  Regular rhythm MS: Normal AROM mjr joints.  No obvious deformity  Abdomen: Soft.  Nondistended.  Nontender.  Ileostomy right upper quadrant pink and edematous. Thin effluent in bag.  Midline incision with weak granulation tissue but clean.  More superficial.  Just being packed at this point.  No evidence of peritonitis.  No incarcerated hernias.  Ext:  2+ edema, especially both hands..  No deformity.  No mjr edema.  No cyanosis Skin: No petechiae / purpura  Results:   PATHOLOGY DIAGNOSIS:   A. COLON JEJUNUM, RIGHT PROXIMAL, RESECTION:  - Right hemicolectomy specimen showing 2 separate tumors  1) Invasive poorly differentiated adenocarcinoma, 12 cm, involving  transverse colon, with marked necrosis  - Carcinoma invades through the serosa into adherent jejunum  - Lymphovascular invasion is present   2) Invasive moderately differentiated adenocarcinoma, 1.6 cm, involving  transverse colon (2.8 cm proximal to the larger tumor)  - Carcinoma invades into pericolonic soft tissue  - Lymphovascular invasion is present   - Resection margins are negative for carcinoma  - Metastatic carcinoma to one of thirty-six lymph nodes (1/36). See  comment  - Unremarkable appendix  - See oncology table   B. LIVER, NODULE, WEDGE:  - Benign liver parenchyma showing a fibrotic nodule with calcification  - Negative for ova, parasites,granulomas or malignancy    COMMENT:   The histomorphology of  metastatic carcinoma in the lymph node is  suggestive of metastasis from the smaller, moderately differentiated  adenocarcinoma.    ONCOLOGY TABLE:   COLON AND RECTUM: Resection, Including Transanal Disk Excision of  Rectal Neoplasms   Procedure: Resection, partial right colon and proximal jejunum  Tumor Site: Transverse colon  Tumor Size: 12.0 cm  Macroscopic Tumor Perforation: Not identified  Histologic Type: Adenocarcinoma  Histologic Grade: G3: Poorly differentiated  Tumor Extension: Tumor invades through serosa into adherent jejunum  Margins: Uninvolved by tumor  Treatment Effect: No known presurgical therapy  Lymphovascular Invasion: Present  Perineural Invasion: Not identified  Tumor Deposits: Not identified  Regional Lymph Nodes:    Number of Lymph Nodes Involved: 1    Number of Lymph Nodes Examined: 36  Pathologic Stage Classification (pTNM, AJCC 8th Edition): mpT4b, pN1a  Ancillary Studies: MMR / MSItesting will be ordered.  Representative Tumor Block: A8  Comments: A separate, invasive moderately differential adenocarcinoma  measuring 1.6 cm was identified 2.8 cm proximal to the larger tumor   (v4.1.0.0)      Kailen Hinkle DESCRIPTION:   A: Specimen: Received fresh labeled right colon and proximal jejunum  Specimen integrity: Specimen consists of an unoriented segment of small  intestine with 2 stapled resection margins, and attached portion of  colon (including terminal ileum, right colon, ascending, hepatic flexure  and portion of transverse colon)  Specimen length: Separate segment of jejunum measures 57.5 cm in length,  and the colon specimen displays 3.0 cm of terminal ileum and 45 cm of  colon.  Mesorectal intactness: Nonapplicable  Tumor location: Within the transverse colon, and extending through the  wall into the adjacent adherent portion of jejunum. Possible second  lesion identified, see mucosal polyp heading.  Tumor size: There is a 12.0 x 8.1  cm tan-gray, firm, circumferential  mass within the distal portion of the colon. The lesion appears to  invade through the wall, and extends into the lumen of the attached  additional portion of jejunum. Adipose tissue surrounding the jejunum  is indurated and slightly nodular.  Percent of bowel circumference involved: 100% of colon circumference,  and approximately 50% of small bowel circumference  Tumor distance to margins:            Proximal: 28.0 cm to the terminal ileum resection  margin            Distal: 5.3 cm from the distal colon resection  margin            Mesenteric (sigmoid and transverse): The serosa and  adipose tissue underlying the bulky colon tumor is puckered and  indurated. The indurated tissue measures approximately 1.9 cm from the  presumed mesenteric margin.            Radial (posterior ascending, posterior descending;  lateral and posterior mid-rectum; and entire lower 1/3 rectum):  Nonapplicable  Macroscopic extent of tumor invasion: Sectioning through the flattened  polyp proximal to the primary lesion reveals a tan-white solid cut  surface, which grossly extends into the muscularis propria and possible  adipose tissue. Sectioning through the primary colon lesion reveals a  tan-white to red cut surface, invading through the muscularis propria  into the surrounding adipose tissue.  Total presumed lymph nodes: 36 tan-gray possible lymph nodes are  identified, ranging from 0.2 cm to 1.4 x 1.0 0.7 cm.  Extramural satellite tumor nodules: None identified  Mucosal polyp(s): 2.8 cm proximal to the main colon lesion there is a  1.6 x 1.3 x 0.2 cm tan-brown, firm, flattened possible secondary lesion  identified. No additional polyps are identified.  Additional findings: Tumor invading into the jejunum measures 23.5 cm to  the closest margin and 33.0 cm from the opposing small bowel margin.  The segment of jejunum is  partially twisted around the indurated adipose  tissue. The uninvolved colonic mucosa is tan-gray with normal folding.  The appendix is present, measures 6.0 cm in length and up to 0.9 cm in  diameter. The serosal surface is tan-gray and smooth, the mucosa is  tan, the wall measures up to 0.2 cm in thickness, the lumen is  moderately dilated and filled with an abundant amount of tan-yellow  fecal material. The indurated and puckered serosa and adipose tissue  underlying the bulky tumor is inked black. The mucosa of the uninvolved  small bowel is tan-gray, slightly dusky and focally edematous.  Block summary:  1 = terminal ileum resection margin  2 = distal colon resection margin  3 = jejunal margin closest to lesion  4 = jejunal margin distal to lesion  5-7 = smaller, secondary colon lesion  8 and 9 = cross-section from colon to jejunum  10-14 = representative sections of primary lesion  15 = grossly uninvolved mucosa  16 = appendix  17 and 18 = 5 possible lymph nodes, each  19 and 20 = 4 possible lymph nodes, each  21-23 = 3 possible lymph nodes, each  24 and 25 = 2 possible lymph nodes, each  26 = 2 possible lymph nodes, differentially inked and bisected  27-29 = 1 possible lymph node bisected, each  30 = tissue for molecular studies   B: The specimen is received fresh and consists of a 1.6 x 1.6 x 0.5 cm  wedge portion of tan-pink, nodular soft tissue. The cut surface  displays a 0.3 cm in greatest dimension tan-white, firm, calcified  nodule. The specimen is bisected and entirely submitted in 1 cassette  following decalcification.      Final Diagnosis performed by Jaquita Folds, MD.  Electronically  signed 04/01/2019  Technical and / or Professional components performed at [facility], [place] [address] [place], [place] [address].  Immunohistochemistry Technical component (if applicable) was performed  at [facility]. [address], [place],  [place], [place] [address].  IMMUNOHISTOCHEMISTRY DISCLAIMER (if applicable):  Some of these immunohistochemical stains may have been developed and the  performance characteristics determine by [facility]. Some  may not have been cleared or approved by the U.S. Food and Drug  Administration. The FDA has determined that such clearance or approval  is not necessary. This test is used for clinical purposes. It should not  be regarded as investigational or for research. This laboratory is  certified under the [place]  (CLIA-88) as qualified to perform high complexity clinical laboratory  testing. The controls stained appropriately.   Cultures: Recent Results (from the past 720 hour(s))  SARS Coronavirus 2 [facility] order, Performed in [facility] hospital lab) Nasopharyngeal Nasopharyngeal Swab     Status: None   Collection Time: 04/18/2019  4:03 PM   Specimen: Nasopharyngeal Swab  Result Value Ref Range Status   SARS Coronavirus 2 NEGATIVE NEGATIVE Final    Comment: (NOTE) If result is NEGATIVE SARS-CoV-2 target nucleic acids are NOT DETECTED. The SARS-CoV-2 RNA is generally detectable in upper and lower  respiratory specimens during the acute phase of infection. The lowest  concentration of SARS-CoV-2 viral copies this assay can detect is 250  copies / mL. A negative result does not preclude SARS-CoV-2 infection  and should not be used as the sole basis for treatment or other  patient management decisions.  A negative result may occur with  improper specimen collection / handling, submission of specimen other  than nasopharyngeal swab, presence of viral mutation(s) within the  areas targeted by this assay, and inadequate number of viral copies  (<250 copies / mL). A negative result must be combined with clinical  observations, patient history, and epidemiological information. If result is POSITIVE SARS-CoV-2  target nucleic acids are DETECTED. The SARS-CoV-2 RNA is generally detectable in upper and lower  respiratory specimens dur ing the acute phase of infection.  Positive  results are indicative of active infection with SARS-CoV-2.  Clinical  correlation with patient history and other diagnostic information is  necessary to determine patient infection status.  Positive results do  not rule out bacterial infection or co-infection with other viruses. If result is PRESUMPTIVE POSTIVE SARS-CoV-2 nucleic acids MAY BE PRESENT.   A presumptive positive result was obtained on the submitted specimen  and confirmed on repeat testing.  While 2019 novel coronavirus  (SARS-CoV-2) nucleic acids may be present in the submitted sample  additional confirmatory testing may be necessary for epidemiological  and / or clinical management purposes  to differentiate between  SARS-CoV-2 and other Sarbecovirus currently known to infect humans.  If clinically indicated additional testing with an alternate test  methodology [phone]) is advised. The SARS-CoV-2 RNA is generally  detectable in upper and lower respiratory sp ecimens during the acute  phase of infection. The expected result is Negative. Fact Sheet for Patients:  StrictlyIdeas.no Fact Sheet for Healthcare Providers: BankingDealers.co.za This test is not yet approved or cleared by the [place] FDA and has been authorized for detection and/or diagnosis of SARS-CoV-2 by FDA under an Emergency Use Authorization (EUA).  This EUA will remain in effect (meaning this test can be used) for the duration of the COVID-19 declaration under Section 564(b)(1) of the Act, 21 U.S.C. section 360bbb-3(b)(1), unless the authorization is terminated or revoked sooner. Performed at [facility], [place] [address] [place], [place] [address]   Blood culture (routine x 2)     Status: None   Collection Time: 03/30/2019   7:55 PM   Specimen: BLOOD LEFT HAND  Result Value Ref Range Status   Specimen Description BLOOD LEFT HAND  Final   Special Requests   Final    BOTTLES DRAWN AEROBIC AND ANAEROBIC Blood Culture adequate volume   Culture   Final    NO GROWTH 5 DAYS Performed at [facility], [place] [address] [place], [place] [address]    Report Status 04/02/2019 FINAL  Final  Blood culture (routine x 2)     Status: None   Collection Time: 04/07/2019  8:04 PM   Specimen: BLOOD RIGHT HAND  Result Value Ref Range Status   Specimen Description BLOOD RIGHT HAND  Final   Special Requests   Final    BOTTLES DRAWN AEROBIC ONLY Blood Culture results may not be optimal due to an inadequate volume of blood received in culture bottles   Culture   Final    NO GROWTH 5 DAYS Performed at [facility], [place] [address], [place], [place] [address]    Report Status 04/02/2019 FINAL  Final  Urine culture     Status: None   Collection Time: 04/05/2019 12:35 AM   Specimen: Urine, Catheterized  Result Value Ref Range Status   Specimen Description URINE, CATHETERIZED  Final   Special Requests NONE  Final   Culture   Final    NO GROWTH Performed at [facility], [place] [address] [place], [place] [address]    Report Status 04/11/2019 FINAL  Final  MRSA PCR Screening     Status: None   Collection Time: 04/05/2019 12:39 PM   Specimen: Nasal Mucosa; Nasopharyngeal  Result Value Ref Range Status   MRSA by PCR NEGATIVE NEGATIVE Final    Comment:        The GeneXpert MRSA Assay (FDA approved for NASAL specimens only), is one component of a comprehensive MRSA colonization surveillance program. It is not intended to diagnose MRSA infection nor  to guide or monitor treatment for MRSA infections. Performed at [facility], [place] [address] [place], [place] [address]   Culture, respiratory (non-expectorated)     Status: None   Collection Time: 03/31/19 11:20 AM   Specimen: Tracheal Aspirate; Respiratory   Result Value Ref Range Status   Specimen Description TRACHEAL ASPIRATE  Final   Special Requests NONE  Final   Gram Stain   Final    RARE WBC PRESENT, PREDOMINANTLY PMN RARE YEAST Performed at [facility], [place] [address] [place], [place] [address]    Culture FEW CANDIDA ALBICANS  Final   Report Status 04/02/2019 FINAL  Final  Culture, bal-quantitative     Status: Abnormal   Collection Time: 04/04/19  8:49 PM   Specimen: Bronchoalveolar Lavage; Respiratory  Result Value Ref Range Status   Specimen Description BRONCHIAL ALVEOLAR LAVAGE  Final   Special Requests NONE  Final   Gram Stain   Final    NO WBC SEEN RARE BUDDING YEAST SEEN Performed at [facility], [address], [place], [place] [address]    Culture 60,000 COLONIES/mL CANDIDA ALBICANS (A)  Final   Report Status 04/06/2019 FINAL  Final    Labs: Results for orders placed or performed during the hospital encounter of 04/07/2019 (from the past 48 hour(s))  Glucose, capillary     Status: Abnormal   Collection Time: 04/10/19  7:27 AM  Result Value Ref Range   Glucose-Capillary 134 (H) 70 - 99 mg/dL  Hemoglobin and hematocrit, blood     Status: Abnormal   Collection Time: 04/10/19 10:28 AM  Result Value Ref Range   Hemoglobin 8.0 (L) 13.0 - 17.0 g/dL   HCT 24.6 (L) 39.0 - 52.0 %    Comment: Performed at [facility], [place] [address] [place], [place] [address]  Protime-INR     Status: Abnormal   Collection Time: 04/10/19 10:28 AM  Result Value Ref Range   Prothrombin Time 16.4 (H) 11.4 - 15.2 seconds   INR 1.3 (H) 0.8 - 1.2    Comment: (NOTE) INR goal varies based on device and disease states. Performed at [facility], [place] [address] [place], [place] [address]   Glucose, capillary     Status: Abnormal   Collection Time: 04/10/19 11:34 AM  Result Value Ref Range   Glucose-Capillary 104 (H) 70 - 99 mg/dL  Glucose, capillary     Status: None   Collection Time: 04/10/19  3:36 PM  Result  Value Ref Range   Glucose-Capillary 91 70 - 99 mg/dL  Glucose, capillary     Status: Abnormal   Collection Time: 04/10/19  7:31 PM  Result Value Ref Range   Glucose-Capillary 110 (H) 70 - 99 mg/dL  Glucose, capillary     Status: Abnormal   Collection Time: 04/10/19 11:08 PM  Result Value Ref Range   Glucose-Capillary 160 (H) 70 - 99 mg/dL  CBC     Status: Abnormal   Collection Time: 04/11/19  3:16 AM  Result Value Ref Range   WBC 12.0 (H) 4.0 - 10.5 K/uL   RBC 2.70 (L) 4.22 - 5.81 MIL/uL   Hemoglobin 7.2 (L) 13.0 - 17.0 g/dL   HCT 22.3 (L) 39.0 - 52.0 %   MCV 82.6 80.0 - 100.0 fL   MCH 26.7 26.0 - 34.0 pg   MCHC 32.3 30.0 - 36.0 g/dL   RDW 22.1 (H) 11.5 - 15.5 %   Platelets 297  150 - 400 K/uL   nRBC 0.2 0.0 - 0.2 %    Comment: Performed at [facility], [place] [address] [place], [place] [address]  Glucose, capillary     Status: Abnormal   Collection Time: 04/11/19  3:20 AM  Result Value Ref Range   Glucose-Capillary 115 (H) 70 - 99 mg/dL  Glucose, capillary     Status: Abnormal   Collection Time: 04/11/19  7:37 AM  Result Value Ref Range   Glucose-Capillary 112 (H) 70 - 99 mg/dL  Glucose, capillary     Status: Abnormal   Collection Time: 04/11/19 11:31 AM  Result Value Ref Range   Glucose-Capillary 144 (H) 70 - 99 mg/dL  I-STAT 7, (LYTES, BLD GAS, ICA, H+H)     Status: Abnormal   Collection Time: 04/11/19 11:33 AM  Result Value Ref Range   pH, Arterial 7.502 (H) 7.350 - 7.450   pCO2 arterial 19.6 (LL) 32.0 - 48.0 mmHg   pO2, Arterial 51.0 (L) 83.0 - 108.0 mmHg   Bicarbonate 15.4 (L) 20.0 - 28.0 mmol/L   TCO2 16 (L) 22 - 32 mmol/L   O2 Saturation 90.0 %   Acid-base deficit 7.0 (H) 0.0 - 2.0 mmol/L   Sodium 149 (H) 135 - 145 mmol/L   Potassium 3.7 3.5 - 5.1 mmol/L   Calcium, Ion 1.36 1.15 - 1.40 mmol/L   HCT 22.0 (L) 39.0 - 52.0 %   Hemoglobin 7.5 (L) 13.0 - 17.0 g/dL   Patient temperature 98.4 F    Collection site RADIAL, ALLEN'S TEST ACCEPTABLE    Sample type  ARTERIAL    Comment NOTIFIED PHYSICIAN   Basic metabolic panel     Status: Abnormal   Collection Time: 04/11/19  2:30 PM  Result Value Ref Range   Sodium 146 (H) 135 - 145 mmol/L   Potassium 3.6 3.5 - 5.1 mmol/L   Chloride 122 (H) 98 - 111 mmol/L   CO2 18 (L) 22 - 32 mmol/L   Glucose, Bld 156 (H) 70 - 99 mg/dL   BUN 58 (H) 8 - 23 mg/dL   Creatinine, Ser 1.63 (H) 0.61 - 1.24 mg/dL   Calcium 8.5 (L) 8.9 - 10.3 mg/dL   GFR calc non Af Amer 39 (L) >60 mL/min   GFR calc Af Amer 45 (L) >60 mL/min   Anion gap 6 5 - 15    Comment: Performed at [facility], [place] [address], [place], [place] [address]  CBC     Status: Abnormal   Collection Time: 04/11/19  2:30 PM  Result Value Ref Range   WBC 11.0 (H) 4.0 - 10.5 K/uL   RBC 2.65 (L) 4.22 - 5.81 MIL/uL   Hemoglobin 7.3 (L) 13.0 - 17.0 g/dL   HCT 21.7 (L) 39.0 - 52.0 %   MCV 81.9 80.0 - 100.0 fL   MCH 27.5 26.0 - 34.0 pg   MCHC 33.6 30.0 - 36.0 g/dL   RDW 22.2 (H) 11.5 - 15.5 %   Platelets 329 150 - 400 K/uL   nRBC 0.3 (H) 0.0 - 0.2 %    Comment: Performed at [facility] [address], [place], [place] [address]  Glucose, capillary     Status: Abnormal   Collection Time: 04/11/19  3:04 PM  Result Value Ref Range   Glucose-Capillary 113 (H) 70 - 99 mg/dL  Glucose, capillary     Status: Abnormal   Collection Time: 04/11/19  7:18 PM  Result Value  Ref Range   Glucose-Capillary 121 (H) 70 - 99 mg/dL  Glucose, capillary     Status: Abnormal   Collection Time: 04/11/19 11:30 PM  Result Value Ref Range   Glucose-Capillary 113 (H) 70 - 99 mg/dL  Glucose, capillary     Status: Abnormal   Collection Time: 04/20/2019  4:01 AM  Result Value Ref Range   Glucose-Capillary 140 (H) 70 - 99 mg/dL  Basic metabolic panel     Status: Abnormal   Collection Time: Apr 20, 2019  4:04 AM  Result Value Ref Range   Sodium 142 135 - 145 mmol/L   Potassium 3.1 (L) 3.5 - 5.1 mmol/L   Chloride 120 (H) 98 - 111 mmol/L   CO2 18 (L) 22 - 32 mmol/L    Glucose, Bld 188 (H) 70 - 99 mg/dL   BUN 59 (H) 8 - 23 mg/dL   Creatinine, Ser 1.80 (H) 0.61 - 1.24 mg/dL   Calcium 8.1 (L) 8.9 - 10.3 mg/dL   GFR calc non Af Amer 34 (L) >60 mL/min   GFR calc Af Amer 40 (L) >60 mL/min   Anion gap 4 (L) 5 - 15    Comment: Performed at [facility] [address] [place], [place] [address]  CBC with Differential/Platelet     Status: Abnormal   Collection Time: 2019-04-20  4:04 AM  Result Value Ref Range   WBC 11.8 (H) 4.0 - 10.5 K/uL   RBC 2.85 (L) 4.22 - 5.81 MIL/uL   Hemoglobin 7.8 (L) 13.0 - 17.0 g/dL   HCT 23.8 (L) 39.0 - 52.0 %   MCV 83.5 80.0 - 100.0 fL   MCH 27.4 26.0 - 34.0 pg   MCHC 32.8 30.0 - 36.0 g/dL   RDW 22.5 (H) 11.5 - 15.5 %   Platelets 362 150 - 400 K/uL   nRBC 0.0 0.0 - 0.2 %   Neutrophils Relative % 87 %   Neutro Abs 10.2 (H) 1.7 - 7.7 K/uL   Lymphocytes Relative 8 %   Lymphs Abs 1.0 0.7 - 4.0 K/uL   Monocytes Relative 2 %   Monocytes Absolute 0.3 0.1 - 1.0 K/uL   Eosinophils Relative 1 %   Eosinophils Absolute 0.1 0.0 - 0.5 K/uL   Basophils Relative 0 %   Basophils Absolute 0.0 0.0 - 0.1 K/uL   Immature Granulocytes 2 %   Abs Immature Granulocytes 0.25 (H) 0.00 - 0.07 K/uL    Comment: Performed at [facility] [address], [place], [place] [address]    Imaging / Studies: Dg Chest Port 1 View  Result Date: 04/11/2019 CLINICAL DATA:  Respiratory distress ,ETT,HX HTN,EMPHYSEMA EXAM: PORTABLE CHEST 1 VIEW COMPARISON:  04/10/2019 FINDINGS: Endotracheal tube is in place with tip 4.6 centimeters above the carina. Nasogastric tube is in place, tip beyond the gastroesophageal junction. RIGHT subclavian line tip overlies the LOWER superior vena cava. Patchy infiltrates are identified throughout the lungs bilaterally with confluence at the LEFT lung base and similar in appearance to prior study. IMPRESSION: 1. Stable appearance of bilateral infiltrates. 2. Stable appearance of support lines and tubes. Electronically  Signed   By: Nolon Nations M.D.   On: 04/11/2019 09:57   Dg Abd Portable 1v  Result Date: 04/10/2019 CLINICAL DATA:  84 year old male with history of laparotomy on 04/05/2019. EXAM: PORTABLE ABDOMEN - 1 VIEW COMPARISON:  No priors. FINDINGS: Enteric tube terminating in the antral pre-pyloric region of the stomach or proximal duodenum. Gas is noted throughout  the colon extending to the level of the distal rectum. No pathologic dilatation of small bowel. Density projecting over the right-side of the abdomen, likely a stoma from recent ileostomy. No pneumoperitoneum. IMPRESSION: 1. Nonobstructive bowel gas pattern. 2. Postoperative changes and support apparatus, as above. Electronically Signed   By: Vinnie Langton M.D.   On: 04/10/2019 14:43    Medications / Allergies: per chart  Antibiotics: Anti-infectives (From admission, onward)   Start     Dose/Rate Route Frequency Ordered Stop   04/05/19 1200  ceFEPIme (MAXIPIME) 2 g in sodium chloride 0.9 % 100 mL IVPB     2 g 200 mL/hr over 30 Minutes Intravenous Every 12 hours 04/05/19 1131 04/08/19 2154   03/30/19 0600  cefoTEtan (CEFOTAN) 2 g in sodium chloride 0.9 % 100 mL IVPB    Note to Pharmacy: Pharmacy may adjust dose strength for optimal dosing.   Send with patient on call to the OR.  Anesthesia to complete antibiotic administration <29mn [facility]   2 g 200 mL/hr over 30 Minutes Intravenous To Short Stay 04/23/2019 1335 04/06/2019 1559   04/05/2019 2200  cefoTEtan (CEFOTAN) 2 g in sodium chloride 0.9 % 100 mL IVPB     2 g 200 mL/hr over 30 Minutes Intravenous Every 12 hours 04/22/2019 1902 03/30/2019 2214   04/01/2019 1730  vancomycin (VANCOCIN) 1,250 mg in sodium chloride 0.9 % 250 mL IVPB  Status:  Discontinued     1,250 mg 166.7 mL/hr over 90 Minutes Intravenous Every 24 hours 04/05/2019 1720 03/31/2019 0928   03/30/2019 1400  neomycin (MYCIFRADIN) tablet 1,000 mg  Status:  Discontinued     1,000 mg Oral 3 times per day  04/20/2019 0620 04/18/2019 0839   04/25/2019 1400  metroNIDAZOLE (FLAGYL) tablet 1,000 mg  Status:  Discontinued     1,000 mg Oral 3 times per day 04/23/2019 0620 04/01/2019 0839   04/04/2019 1013  vancomycin variable dose per unstable renal function (pharmacist dosing)  Status:  Discontinued      Does not apply See admin instructions 03/31/2019 1014 03/30/2019 1908   03/30/2019 0930  metroNIDAZOLE (FLAGYL) IVPB 500 mg  Status:  Discontinued     500 mg 100 mL/hr over 60 Minutes Intravenous Every 8 hours 04/07/2019 0920 04/01/19 1054   04/23/2019 0630  cefoTEtan (CEFOTAN) 2 g in sodium chloride 0.9 % 100 mL IVPB  Status:  Discontinued     2 g 200 mL/hr over 30 Minutes Intravenous On call to O.R. 04/21/2019 0003410/21/2020 0839   04/17/2019 0630  clindamycin (CLEOCIN) 900 mg, gentamicin (GARAMYCIN) 240 mg in sodium chloride 0.9 % 1,000 mL for intraperitoneal lavage  Status:  Discontinued      Irrigation To Surgery 03/30/2019 0620 04/14/2019 0840   04/18/2019 0500  ceFEPIme (MAXIPIME) 2 g in sodium chloride 0.9 % 100 mL IVPB  Status:  Discontinued     2 g 200 mL/hr over 30 Minutes Intravenous Every 12 hours 04/10/2019 1720 04/04/19 0459   04/16/2019 1630  ceFEPIme (MAXIPIME) 2 g in sodium chloride 0.9 % 100 mL IVPB     2 g 200 mL/hr over 30 Minutes Intravenous  Once 04/06/2019 1617 04/13/2019 1804   04/21/2019 1630  metroNIDAZOLE (FLAGYL) IVPB 500 mg     500 mg 100 mL/hr over 60 Minutes Intravenous  Once 04/05/2019 1617 04/24/2019 1928   04/19/2019 1630  vancomycin (VANCOCIN) IVPB 1000 mg/200 mL premix  Status:  Discontinued     1,000 mg 200  mL/hr over 60 Minutes Intravenous  Once 04/11/2019 1617 04/05/2019 1626   04/04/2019 1630  vancomycin (VANCOCIN) 1,500 mg in sodium chloride 0.9 % 500 mL IVPB     1,500 mg 250 mL/hr over 120 Minutes Intravenous  Once 04/27/2019 1626 04/27/2019 2157        Note: Portions of this report may have been transcribed using voice recognition software. Every effort was made to ensure accuracy; however, inadvertent  computerized transcription errors may be present.   Any transcriptional errors that result from this process are unintentional.     Adin Hector, MD, FACS, MASCRS [facility] and [facility]    [address] [place], [place] [address] [phone] Main / Paging [phone] Fax

## 2019-04-28 NOTE — Discharge Summary (Signed)
DEATH SUMMARY   Patient Details  Name: Darryl Johns MRN: [id number] DOB: [birth year]  Admission/Discharge Information   Admit Date:  April 21, 2019  Date of Death: Date of Death: 2019-05-06  Time of Death: Time of Death: 14-Oct-2103  Length of Stay: Oct 03, 2022  Referring Physician: Kristie Cowman, MD   Reason(s) for Hospitalization  Patient was admitted with altered mental status  Diagnoses  Preliminary cause of death: Sepsis Secondary Diagnoses (including complications and co-morbidities):  Principal Problem:   Acute ischemia of jejunum s/p SB resection 04/27/2019 Active Problems:   Essential hypertension   RBBB   Dizziness   Hypotension   Peripheral neuropathy   Gait abnormality   Sepsis ([place])   Protein-calorie malnutrition, severe   Cancer of transverse colon pT4b, 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   Lung mass - primary vs colon metastasis   HOH (hard of hearing)   Coffee ground emesis   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HCC)   Shock ([place])   Cachexia ([place])   Elevated carcinoembryonic antigen (CEA)   Recent unintentional weight loss over several months   Hemorrhagic shock ([place])   AKI (acute kidney injury) ([place])   Emphysema of lung (HCC)   Coagulopathy (HCC)   Colon bleed requiring more than 4 units of blood in 24 hours, ICU, or surgery   Generalized anxiety disorder   Acute respiratory distress   Aspiration into airway   Acute respiratory failure with hypoxia ([place])   Pressure injury of skin   Left ventricular diastolic dysfunction with preserved systolic function   Anemia due to GI blood loss   Anemia of chronic disease   High output ileostomy ([place])   Palliative care encounter   Brief Hospital Course (including significant findings, care, treatment, and services provided and events leading to death)  Darryl Johns is a 84 y.o. year old male who admitted with altered mental status Found to have a sigmoid volvulus for which he went for surgery-diagnosed with metastatic  adenocarcinoma Patient was extubated, had to be reintubated He continued to deteriorate Discussions had with family, patient will not want to be kept alive artificially We will not desire tracheostomy or feeding tube to end up in a nursing home  Was withdrawn to comfort measures only  Patient expired 14-Oct-2103 on 05-06-19  Pertinent Labs and Studies  Significant Diagnostic Studies Ct Head Wo Contrast  Result Date: April 21, 2019 CLINICAL DATA:  Trauma.  Altered mental status.  Fall. EXAM: CT HEAD WITHOUT CONTRAST CT CERVICAL SPINE WITHOUT CONTRAST TECHNIQUE: Multidetector CT imaging of the head and cervical spine was performed following the standard protocol without intravenous contrast. Multiplanar CT image reconstructions of the cervical spine were also generated. COMPARISON:  None. FINDINGS: CT HEAD FINDINGS Brain: No evidence of acute infarction, hemorrhage, hydrocephalus, extra-axial collection or mass lesion/mass effect. There is ventricular and sulcal enlargement reflecting mild diffuse atrophy. White matter hypoattenuation is noted bilaterally consistent with advanced chronic microvascular ischemic change. 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 There is a focus of calcification in the right pons. Vascular: No hyperdense vessel or unexpected calcification. Skull: Normal. Negative for fracture or focal lesion. Sinuses/Orbits: Globes and orbits are unremarkable. Sinuses and mastoid air cells are clear. Other: None. CT CERVICAL SPINE FINDINGS Alignment: Normal. Skull base and vertebrae: No acute fracture. No primary bone lesion or focal pathologic process. Soft tissues and spinal canal: No prevertebral fluid or swelling. No visible canal hematoma. Oval low-attenuation soft tissue mass in the subcutaneous soft tissues of the left posterior neck measuring 2.4 cm,  consistent with a sebaceous or inclusion cyst. Disc levels: Moderate loss of disc height at C4-C5 and C6-C7 with spondylotic disc  bulging and endplate spurring. Facet degenerative changes are noted bilaterally, greatest on the right at C5-C6. No evidence of a disc herniation. Upper chest: No acute findings. Other: None. IMPRESSION: HEAD CT 1. No acute intracranial abnormalities. 2. No skull fracture. 3. Moderate chronic microvascular ischemic change. CERVICAL CT 1. No fracture or acute finding. Electronically Signed   By: Lajean Manes M.D.   On: 04/26/2019 18:18   Ct Angio Chest Pe W And/or Wo Contrast  Result Date: 04/13/2019 CLINICAL DATA:  84 year old male with clinical suspicion of pulmonary embolism. High pretest probability. EXAM: CT ANGIOGRAPHY CHEST WITH CONTRAST TECHNIQUE: Multidetector CT imaging of the chest was performed using the standard protocol during bolus administration of intravenous contrast. Multiplanar CT image reconstructions and MIPs were obtained to evaluate the vascular anatomy. CONTRAST:  155mL OMNIPAQUE IOHEXOL 350 MG/ML SOLN COMPARISON:  No priors. FINDINGS: Cardiovascular: No filling defects within the pulmonary arterial tree to suggest underlying pulmonary embolism. Heart size is normal. There is no significant pericardial fluid, thickening or pericardial calcification. There is aortic atherosclerosis, as well as atherosclerosis of the great vessels of the mediastinum and the coronary arteries, including calcified atherosclerotic plaque in the left main, left anterior descending, left circumflex and right coronary arteries. Mediastinum/Nodes: No pathologically enlarged mediastinal or hilar lymph nodes. Patulous esophagus which is partially fluid filled. No axillary lymphadenopathy. Lungs/Pleura: Patchy multifocal airspace consolidation in the right lower lobe dependently, potentially sequela of aspiration. In the medial aspect of the left upper lobe (axial image 37 of series 7) there is a well-circumscribed pulmonary nodule measuring 1.2 x 1.4 cm. No other definite suspicious appearing pulmonary nodules or  masses are noted. Mild diffuse bronchial wall thickening with mild centrilobular and paraseptal emphysema. No pleural effusions. Upper Abdomen: Aortic atherosclerosis. Pneumobilia, presumably from prior sphincterotomy. Small volume of ascites noted adjacent to the liver and spleen. Aortic atherosclerosis. Musculoskeletal: There are no aggressive appearing lytic or blastic lesions noted in the visualized portions of the skeleton. Review of the MIP images confirms the above findings. IMPRESSION: 1. No evidence of pulmonary embolism. 2. Dependent areas of airspace consolidation in the right lower lobe, concerning for sequela of aspiration. 3. 1.2 x 1.4 cm well-circumscribed nodule in the left upper lobe. Consider one of the following in 3 months for both low-risk and high-risk individuals: (a) repeat chest CT or (b) follow-up PET-CT. This recommendation follows the consensus statement: Guidelines for Management of Incidental Pulmonary Nodules Detected on CT Images: From the Fleischner Society 2017; Radiology 2017; 284:228-243. 4. Mild diffuse bronchial wall thickening with mild centrilobular and paraseptal emphysema; imaging findings suggestive of underlying COPD. 5. Aortic atherosclerosis, in addition to left main and 3 vessel coronary artery disease. 6. Small volume of ascites. Aortic Atherosclerosis (ICD10-I70.0) and Emphysema (ICD10-J43.9). Electronically Signed   By: Vinnie Langton M.D.   On: 04/23/2019 18:21   Ct Cervical Spine Wo Contrast  Result Date: 04/11/2019 CLINICAL DATA:  Trauma.  Altered mental status.  Fall. EXAM: CT HEAD WITHOUT CONTRAST CT CERVICAL SPINE WITHOUT CONTRAST TECHNIQUE: Multidetector CT imaging of the head and cervical spine was performed following the standard protocol without intravenous contrast. Multiplanar CT image reconstructions of the cervical spine were also generated. COMPARISON:  None. FINDINGS: CT HEAD FINDINGS Brain: No evidence of acute infarction, hemorrhage,  hydrocephalus, extra-axial collection or mass lesion/mass effect. There is ventricular and sulcal enlargement reflecting mild diffuse  atrophy. White matter hypoattenuation is noted bilaterally consistent with advanced chronic microvascular ischemic change. 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 There is a focus of calcification in the right pons. Vascular: No hyperdense vessel or unexpected calcification. Skull: Normal. Negative for fracture or focal lesion. Sinuses/Orbits: Globes and orbits are unremarkable. Sinuses and mastoid air cells are clear. Other: None. CT CERVICAL SPINE FINDINGS Alignment: Normal. Skull base and vertebrae: No acute fracture. No primary bone lesion or focal pathologic process. Soft tissues and spinal canal: No prevertebral fluid or swelling. No visible canal hematoma. Oval low-attenuation soft tissue mass in the subcutaneous soft tissues of the left posterior neck measuring 2.4 cm, consistent with a sebaceous or inclusion cyst. Disc levels: Moderate loss of disc height at C4-C5 and C6-C7 with spondylotic disc bulging and endplate spurring. Facet degenerative changes are noted bilaterally, greatest on the right at C5-C6. No evidence of a disc herniation. Upper chest: No acute findings. Other: None. IMPRESSION: HEAD CT 1. No acute intracranial abnormalities. 2. No skull fracture. 3. Moderate chronic microvascular ischemic change. CERVICAL CT 1. No fracture or acute finding. Electronically Signed   By: Lajean Manes M.D.   On: 04/06/2019 18:18   Ct Abdomen Pelvis W Contrast  Result Date: 03/28/2019 CLINICAL DATA:  Abdominal mass, nonpulsatile and palpable, known transverse colon mass. EXAM: CT ABDOMEN AND PELVIS WITH CONTRAST TECHNIQUE: Multidetector CT imaging of the abdomen and pelvis was performed using the standard protocol following bolus administration of intravenous contrast. CONTRAST:  26mL OMNIPAQUE IOHEXOL 300 MG/ML  SOLN COMPARISON:  None. FINDINGS: Lower chest: Dense  basilar consolidation on the right with patchy opacities and small right effusion. Also with scattered opacities in the inferior left chest. Hepatobiliary: No signs of focal hepatic lesion. Gallbladder distended without signs of pericholecystic inflammation. No biliary ductal dilation. Pancreas: Pancreatic atrophy, mild, no ductal dilation or acute process. Spleen: Normal appearance of the spleen. Adrenals/Urinary Tract: Mild bilateral adrenal thickening, nonspecific. Signs of excreted contrast with symmetric renal enhancement, no renal mass. No hydronephrosis. Stomach/Bowel: Signs of obstructing mass in the mid transverse colon, circumferential mass measuring approximately 7.9 x 4.8 cm in greatest axial dimension with wall thickness of 2.5 to 3 cm. Mass is obstructing in adjacent small bowel loop in the jejunum which it invades, best seen in the coronal plane. Small bowel loops in the right hemiabdomen show mural stratification, wall thickening and some areas of nonenhancement, best seen on axial dataset, image 57 of series 3 the more posteriorly placed small bowel loop does not appear to show enhancement to the same extent as surrounding small bowel loops. The duodenum does not cross midline in this patient. There is a partial mesenteric twist. No signs of free air. The appendix is normal. There is free fluid also along the right flank tracking into the pelvis, right upper quadrant and to a lesser extent across the abdomen into the left hemiabdomen. Question thickening also of the descending colon at the descending/sigmoid junction. There is marked gastric distention with distended patulous esophagus above. Vascular/Lymphatic: No signs of acute vascular process. Moderate calcific atherosclerotic changes throughout the abdominal aorta extending the iliac vessels. Scattered lymph nodes are seen in the abdomen. No signs of pelvic adenopathy. Reproductive: Prostate heterogeneous. Foley catheter in the urinary bladder.  Other: No signs of free air with fluid throughout the abdomen as discussed. Some nodularity adjacent to the primary mass in the colon. Musculoskeletal: No signs of acute bone finding or destructive bone process. IMPRESSION: 1. Small-bowel obstruction  with signs of bowel ischemia secondary to colonic mass with involvement of adjacent small bowel. 2. Small bowel loops in the right hemiabdomen are jejunal. 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 3. No signs of free air at this time though bowel with this appearance is at risk for perforation. 4. Signs of bilateral pneumonia and/or aspiration related changes. 5. Free fluid without abscess is likely related to ischemic enteritis. 6. These results were called by telephone at the time of interpretation on 04/27/2019 at 12:55 pm to provider [facility] , who verbally acknowledged these results. Electronically Signed   By: Zetta Bills M.D.   On: 04/02/2019 12:56   Dg Chest Port 1 View  Result Date: 04/11/2019 CLINICAL DATA:  Respiratory distress ,ETT,HX HTN,EMPHYSEMA EXAM: PORTABLE CHEST 1 VIEW COMPARISON:  04/10/2019 FINDINGS: Endotracheal tube is in place with tip 4.6 centimeters above the carina. Nasogastric tube is in place, tip beyond the gastroesophageal junction. RIGHT subclavian line tip overlies the LOWER superior vena cava. Patchy infiltrates are identified throughout the lungs bilaterally with confluence at the LEFT lung base and similar in appearance to prior study. IMPRESSION: 1. Stable appearance of bilateral infiltrates. 2. Stable appearance of support lines and tubes. Electronically Signed   By: Nolon Nations M.D.   On: 04/11/2019 09:57   Dg Chest Port 1 View  Result Date: 04/10/2019 CLINICAL DATA:  Respiratory failure. EXAM: PORTABLE CHEST 1 VIEW COMPARISON:  April 08, 2019. FINDINGS: The heart size and mediastinal contours are within normal limits. Endotracheal and  nasogastric tubes are unchanged in position. No pneumothorax is noted. Atherosclerosis of thoracic aorta is noted. Stable bilateral lung opacities are noted consistent with edema and associated pleural effusions. The visualized skeletal structures are unremarkable. IMPRESSION: Stable support apparatus.  Stable bilateral lung opacities. Electronically Signed   By: Marijo Conception M.D.   On: 04/10/2019 07:27   Dg Chest Port 1 View  Result Date: 04/08/2019 CLINICAL DATA:  Respiratory failure EXAM: PORTABLE CHEST 1 VIEW COMPARISON:  Yesterday FINDINGS: Endotracheal tube tip between the clavicular heads and carina. Right subclavian line with tip at the upper cavoatrial junction. The enteric tube tip reaches the pyloric region. Hazy bilateral airspace opacity pleural fluid on the left at least. Normal heart size and mediastinal contours. No pneumothorax. IMPRESSION: 1. Stable hardware positioning. 2. Unchanged bilateral airspace disease with symmetry and small pleural effusions favoring pulmonary edema. Electronically Signed   By: Monte Fantasia M.D.   On: 04/08/2019 06:28   Dg Chest Port 1 View  Result Date: 04/07/2019 CLINICAL DATA:  Respiratory failure  ET tube EXAM: PORTABLE CHEST 1 VIEW COMPARISON:  04/06/2019 FINDINGS: Central venous line and NG tube unchanged. Endotracheal tube unchanged in position. There is bilateral patchy airspace disease which is slightly less dense than prior. Small LEFT effusion. IMPRESSION: 1. Stable support apparatus. 2. Mild improvement in diffuse airspace disease. Electronically Signed   By: Suzy Bouchard M.D.   On: 04/07/2019 07:09   Dg Chest Port 1 View  Result Date: 04/06/2019 CLINICAL DATA:  Endotracheal placement EXAM: PORTABLE CHEST 1 VIEW COMPARISON:  04/04/2019 FINDINGS: Endotracheal tube tip is 3 cm above the carina. Orogastric or nasogastric tube enters the stomach. Right subclavian central line tip at the SVC RA junction. Heart size is normal. Chronic aortic  atherosclerosis. Widespread bilateral pulmonary infiltrates persist, most consistent with pneumonia. There may be slight worsening on the left. IMPRESSION: Lines and tubes well position. Persistent widespread bilateral  infiltrates, possibly slightly worsened on the left. Electronically Signed   By: Nelson Chimes M.D.   On: 04/06/2019 09:41   Dg Chest Port 1 View  Result Date: 04/04/2019 CLINICAL DATA:  Respiratory distress EXAM: PORTABLE CHEST 1 VIEW COMPARISON:  04/04/2019, 04/03/2019, 04/24/2019 FINDINGS: Endotracheal tube tip is about 2.5 cm superior to the carina. Esophageal tube tip below the diaphragm but incompletely visualized. Right subclavian central venous catheter tip over the proximal right atrium. Overall no significant change in extensive bilateral interstitial and ground-glass opacity which may be secondary to edema or diffuse infection. Suspected small pleural effusions. Table cardiomediastinal silhouette. IMPRESSION: 1. Endotracheal tube tip about 2.5 cm superior to carina 2. No significant interval change in extensive bilateral interstitial and ground-glass airspace disease. Probable small pleural effusions. Electronically Signed   By: Donavan Foil M.D.   On: 04/04/2019 20:55   Dg Chest Port 1 View  Result Date: 04/04/2019 CLINICAL DATA:  Respiratory failure EXAM: PORTABLE CHEST 1 VIEW COMPARISON:  Yesterday FINDINGS: Endotracheal tube tip just below the clavicular heads. The orogastric tube at least reaches the stomach. Right subclavian line with tip at the upper right atrium. Extensive bilateral airspace disease. There is improved inflation at the right base. Normal heart size. Probable small pleural effusions IMPRESSION: 1. Stable and unremarkable hardware positioning. 2. Improved aeration at the right base but still extensive bilateral airspace disease. Electronically Signed   By: Monte Fantasia M.D.   On: 04/04/2019 07:57   Dg Chest Port 1 View  Result Date: 04/03/2019 CLINICAL  DATA:  Respiratory failure EXAM: PORTABLE CHEST 1 VIEW COMPARISON:  04/02/2019 FINDINGS: No significant change in AP portable examination with endotracheal tube, esophagogastric tube, and right subclavian vascular catheter. Esophagogastric tube tip and side port are directed significantly right of midline likely within the proximal duodenum. Diffuse bilateral interstitial pulmonary opacity, likely with small layering pleural effusions. No new airspace opacity. The heart and mediastinum are unremarkable. IMPRESSION: 1. No significant change in AP portable examination with endotracheal tube, esophagogastric tube, and right subclavian vascular catheter. 2. Esophagogastric tube tip and side port are directed significantly right of midline likely within the proximal duodenum. Consider slight retraction to ensure intragastric positioning. 3. Diffuse bilateral interstitial pulmonary opacity, likely with small layering pleural effusions, consistent with edema or infection. No new airspace opacity. Electronically Signed   By: Eddie Candle M.D.   On: 04/03/2019 08:37   Dg Chest Port 1 View  Result Date: 04/02/2019 CLINICAL DATA:  Acute respiratory failure with hypoxia EXAM: PORTABLE CHEST 1 VIEW COMPARISON:  Chest radiograph 03/30/2019 FINDINGS: The endotracheal tube tip is 4.1 cm above the carina. Nasogastric tube enters the stomach. A right subclavian central line is noted with tip projecting over the right atrium. Worsened indistinct perihilar and basilar opacities with interstitial accentuation. Hazy accentuated density at the lung bases with some mild blunting of the lateral costophrenic angles. Heart size within normal limits. Atherosclerotic calcification of the aortic arch. Vague gaseous density along the liver, nonspecific for free air versus air within bowel. However the patient did have exploratory laparotomy four days ago. IMPRESSION: 1. Worsened bilateral interstitial and perihilar and basilar airspace  opacities. Possibilities may include drug reaction, atypical pneumonia, or noncardiogenic edema. 2. Hazy obscuration of the lung bases, possibly from atelectasis although a component of pleural effusion cannot be excluded given the mild blunting of both costophrenic angles laterally. 3.  Aortic Atherosclerosis (ICD10-I70.0). 4. Tubes and lines satisfactorily position. 5. Questionable free intraperitoneal gas. The patient had  exploratory laparotomy 4 days ago and thus this is not considered unexpected even if it does represent free intraperitoneal gas. Electronically Signed   By: Van Clines M.D.   On: 04/02/2019 08:42   Portable Chest X-ray  Result Date: 04/22/2019 CLINICAL DATA:  Verify endotracheal tube placement EXAM: PORTABLE CHEST 1 VIEW COMPARISON:  04/24/2019 FINDINGS: Endotracheal tube with the tip 2 cm above the carina. Nasogastric tube with the tip projecting over the stomach. Right subclavian central venous catheter with the tip projecting over the cavoatrial junction. Bilateral diffuse interstitial thickening. No pleural effusion or pneumothorax. Stable cardiomediastinal silhouette. No acute osseous abnormality. Gaseous distension of the colon. IMPRESSION: 1. Endotracheal tube with the tip 2 cm above the carina. 2. Nasogastric tube with the tip projecting over the stomach. Electronically Signed   By: Kathreen Devoid   On: 03/31/2019 15:12   Dg Chest Portable 1 View  Result Date: 04/16/2019 CLINICAL DATA:  Shortness of breath EXAM: PORTABLE CHEST 1 VIEW COMPARISON:  08/25/2009 FINDINGS: Heart is normal size. Low volumes. Bibasilar opacities likely reflect atelectasis. Aortic atherosclerosis. No visible effusions or acute bony abnormality. IMPRESSION: Low lung volumes with bibasilar atelectasis. Electronically Signed   By: Rolm Baptise M.D.   On: 04/10/2019 16:05   Dg Abd Portable 1v  Result Date: 04/10/2019 CLINICAL DATA:  83 year old male with history of laparotomy on 04/17/2019. EXAM:  PORTABLE ABDOMEN - 1 VIEW COMPARISON:  No priors. FINDINGS: Enteric tube terminating in the antral pre-pyloric region of the stomach or proximal duodenum. Gas is noted throughout the colon extending to the level of the distal rectum. No pathologic dilatation of small bowel. Density projecting over the right-side of the abdomen, likely a stoma from recent ileostomy. No pneumoperitoneum. IMPRESSION: 1. Nonobstructive bowel gas pattern. 2. Postoperative changes and support apparatus, as above. Electronically Signed   By: Vinnie Langton M.D.   On: 04/10/2019 14:43    Microbiology No results found for this or any previous visit (from the past 240 hour(s)).  Lab Basic Metabolic Panel: No results for input(s): NA, K, CL, CO2, GLUCOSE, BUN, CREATININE, CALCIUM, MG, PHOS in the last 168 hours. Liver Function Tests: No results for input(s): AST, ALT, ALKPHOS, BILITOT, PROT, ALBUMIN in the last 168 hours. No results for input(s): LIPASE, AMYLASE in the last 168 hours. No results for input(s): AMMONIA in the last 168 hours. CBC: No results for input(s): WBC, NEUTROABS, HGB, HCT, MCV, PLT in the last 168 hours. Cardiac Enzymes: No results for input(s): CKTOTAL, CKMB, CKMBINDEX, TROPONINI in the last 168 hours. Sepsis Labs: No results for input(s): PROCALCITON, WBC, LATICACIDVEN in the last 168 hours.  Procedures/Operations  03/28/2019 had exploratory laparotomy with bowel resection, wedge biopsy of liver mass  Cassidie Veiga A Erhard Senske 04/20/2019, 4:05 PM

## 2019-04-28 NOTE — Progress Notes (Signed)
NAME:  Darryl Johns, MRN:  [id number], DOB:  [birth year], LOS: 66 ADMISSION DATE:  04/23/2019, CONSULTATION DATE:   REFERRING MD: Velna Ochs MD, CHIEF COMPLAINT:  GI bleed  Brief History   84 yo male former smoker presented to ER with altered mental status after falling.  Found to have hematemesis and melena with hemorrhagic shock.  CT imaging showed transverse colon mass and SB volvulus.  Required ventilatory support and pressors. Discussed with family 10/15/ 2020 regarding trach  Past Medical History  RBBB, Sensorimotor axonal peripheral neuropathy, HTN, allergies, colon mass, Sjogren's syndrome, Emphysema  Significant Hospital Events   10/02 laparotomy with Rt colectomy and end ileostomy, jejunal SB resection with anastomosis, wedge bx of liver mass, application of wound vac over closed fascia  10/06 off pressors 10/08 extubated >> reintubated with concern for airway protection 10/12 A fib with RVR, start heparin gtt  Consults:  GI Surgery  Procedures:  ETT 10/2 >> 10/8 Rt [place] CVL 10/2 >> ETT 10/8 >>  Significant Diagnostic Tests:  CTA 10/1 > no PE, right lower lobe consolidation.  1.4 cm left upper lobe nodule.  Emphysema, aortic, coronary atherosclerosis. CT head / neck 10/1 >> chronic ischemic changes.  No acute abnormalities. Echo 10/12 > EF 60 to 65%, mild MR/TR, grade 1 DD Colon biopsy 10/2 > Invasive poorly differentiated adenocarcinoma, 12 cm, involving  transverse colon, with marked necrosis. Carcinoma invades through the serosa into adherent jejunum. Lymphovascular invasion is present.  Micro Data:  COVID 10/1 >> negative  Tracheal aspirate 10/4 >> Predom PMN, Rare yeast BCx2 10/1 >> negative BAL 10/8 >> Candida albicans  Antimicrobials:  Cefepime 10/1 >> 10/7 Cefepime 10/9 >> 10/12  Interim history/subjective:  Continues with melena from stoma. Heparin held.  Denies pain or discomfort On vent Objective   Blood pressure (!) 112/47, pulse 97,  temperature 98.4 F (36.9 C), temperature source Axillary, resp. rate (!) 30, height _0  (1.88 m), weight 74.6 kg, SpO2 100 %.    Vent Mode: PRVC FiO2 (%):  [30 %] 30 % Set Rate:  [24 bmp] 24 bmp Vt Set:  [570 mL-580 mL] 580 mL PEEP:  [5 cmH20] 5 cmH20 Plateau Pressure:  [21 cmH20-31 cmH20] 23 cmH20   Intake/Output Summary (Last 24 hours) at 28-Apr-2019 0813 Last data filed at April 28, 2019 0745 Gross per 24 hour  Intake 2585.73 ml  Output 3485 ml  Net -899.27 ml   Filed Weights   04/10/19 0434 04/11/19 0321 April 28, 2019 0421  Weight: 76.2 kg 75.3 kg 74.6 kg    Examination: General -frail, elderly, awake and interactive HEENT -pupils equal and reactive Cardiac -sinus no procedure Chest -coarse breath sounds bilaterally  abdomen - Ostomy look beefy red, draining brown stool Extremities - 1+ edema to upper and lower extremities.  Skin - no rashes Neuro -following commands  Resolved Hospital Problem list   Lactic Acidosis, Hemorrhagic shock, Acute GI bleed, Elevated ammonia in setting of GI bleed, HCAP  Assessment & Plan:    .  Patient is DO NOT RESUSCITATE .  Goals of care discussion ongoing .  Expecting call from family today  .  Acute hypoxic respiratory failure .  Emphysema .  Interstitial edema -Still with copious secretions -Not tolerating breathing trials, secondary to weakness and deconditioning -Discussion had with family on 04/11/2019  .  Questionable aspiration event on 10/14 -Chest x-ray did not reveal new infiltrative process -Monitor for fever, monitor chest x-ray findings  .  New onset atrial fibrillation 10/12 -Discontinue  Lopressor -Heparin on hold secondary to GI bleed  .  Left upper lobe nodule -Assessment of outpatient  .  Status post volvulus, colon cancer, status post laparotomy -Postoperative care per surgery -Tube feeds restarted  .  Malnutrition -Continue supplemental nutrition  .  Metastatic adenocarcinoma of the colon -We will  require chemotherapy when more stable, difficulty is whether he will be able to rally enough to be able to tolerate treatment   Acute kidney injury Hypokalemia -Replace electrolytes-potassium replacement -Avoid nephrotoxic's -Follow BMET  .  Anemia of critical illness -Trend CBC  .  Metabolic encephalopathy -Monitor closely   Best practice:  Diet: tube feeds DVT prophylaxis: SCDs GI prophylaxis: protonix Mobility: Bed Code Status: Full Code   Disposition: ICU  Labs    CMP Latest Ref Rng & Units 2019-04-22 04/11/2019 04/11/2019  Glucose 70 - 99 mg/dL 188(H) 156(H) -  BUN 8 - 23 mg/dL 59(H) 58(H) -  Creatinine 0.61 - 1.24 mg/dL 1.80(H) 1.63(H) -  Sodium 135 - 145 mmol/L 142 146(H) 149(H)  Potassium 3.5 - 5.1 mmol/L 3.1(L) 3.6 3.7  Chloride 98 - 111 mmol/L 120(H) 122(H) -  CO2 22 - 32 mmol/L 18(L) 18(L) -  Calcium 8.9 - 10.3 mg/dL 8.1(L) 8.5(L) -  Total Protein 6.5 - 8.1 g/dL - - -  Total Bilirubin 0.3 - 1.2 mg/dL - - -  Alkaline Phos 38 - 126 U/L - - -  AST 15 - 41 U/L - - -  ALT 0 - 44 U/L - - -   CBC Latest Ref Rng & Units April 22, 2019 04/11/2019 04/11/2019  WBC 4.0 - 10.5 K/uL 11.8(H) 11.0(H) -  Hemoglobin 13.0 - 17.0 g/dL 7.8(L) 7.3(L) 7.5(L)  Hematocrit 39.0 - 52.0 % 23.8(L) 21.7(L) 22.0(L)  Platelets 150 - 400 K/uL 362 329 -   ABG    Component Value Date/Time   PHART 7.502 (H) 04/11/2019 1133   PCO2ART 19.6 (LL) 04/11/2019 1133   PO2ART 51.0 (L) 04/11/2019 1133   HCO3 15.4 (L) 04/11/2019 1133   TCO2 16 (L) 04/11/2019 1133   ACIDBASEDEF 7.0 (H) 04/11/2019 1133   O2SAT 90.0 04/11/2019 1133   CBG (last 3)  Recent Labs    04/11/19 1918 04/11/19 2330 22-Apr-2019 0401  GLUCAP 121* 113* 140*   The patient is critically ill with multiple organ systems failure and requires high complexity decision making for assessment and support, frequent evaluation and titration of therapies, application of advanced monitoring technologies and extensive interpretation of  multiple databases. Critical Care Time devoted to patient care services described in this note independent of APP/resident time (if applicable)  is 35 minutes.   Sherrilyn Rist MD [facility] Personal pager: [phone] If unanswered, please page CCM On-call: [phone]

## 2019-04-28 NOTE — Progress Notes (Signed)
This chaplain responded to consult for Pt. and family spiritual care. The chaplain understands from talking to the RN the family plans to visit at [facility].  The Pt. was not alert at time of chaplain visit. The chaplain will communicate request for pastoral care to spiritual care team.

## 2019-04-28 NOTE — Procedures (Signed)
Extubation Procedure Note  Patient Details:   Name: Darryl Johns DOB: 1934-09-18 MRN: [id number]   Airway Documentation:    Vent end date: 04-30-2019 Vent end time: 1720   Patient extubated per MD order. RN at bedside. Comfort measures in place.    Darryl Johns Apr 30, 2019, 5:35 PM

## 2019-04-28 NOTE — Progress Notes (Signed)
Patient expired at 2105. Declared by 2 RN's per orders. Pieter Partridge, RN and Jonnie Kind, RN. Family notified of patient passing. Not ready to discuss funeral arrangements at this time.   Milford Cage, RN

## 2019-04-28 NOTE — Progress Notes (Signed)
Wasted 75 ML Morphine in liquid medication disposal with Jonnie Kind, RN.   Milford Cage, RN

## 2019-04-28 NOTE — Plan of Care (Signed)
Patient expired- plan of care complete.

## 2019-04-28 DEATH — deceased

## 2019-05-07 ENCOUNTER — Other Ambulatory Visit: Payer: Self-pay | Admitting: Internal Medicine

## 2020-06-21 IMAGING — CT CT ABD-PELV W/ CM
2 of 5 series · 14 of 46 positions shown, 16 images · IV contrast (Omni 300)
Comparison: None.

CLINICAL DATA: Abdominal mass, nonpulsatile and palpable, known
transverse colon mass.

EXAM:
CT ABDOMEN AND PELVIS WITH CONTRAST
TECHNIQUE: Multidetector CT imaging of the abdomen and pelvis was performed
using the standard protocol following bolus administration of
intravenous contrast.
CONTRAST:  80mL OMNIPAQUE IOHEXOL 300 MG/ML  SOLN

[Series 3: a/p w/ 5mm · axial · 0.65mm/px · z∈[-455,-30]mm · 11 of 97 slices shown, 13 images]
[im 6/97  soft-tissue]
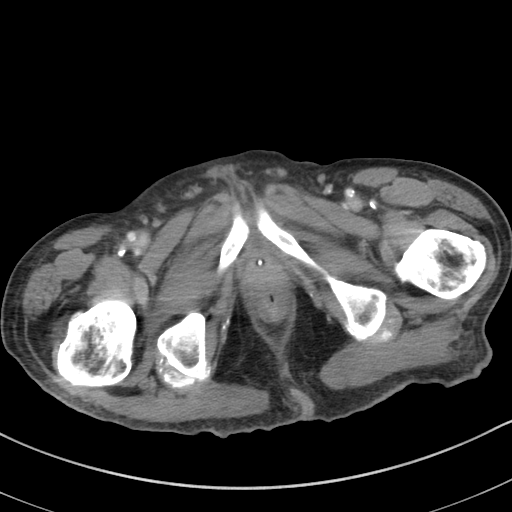
[im 6/97  bone]
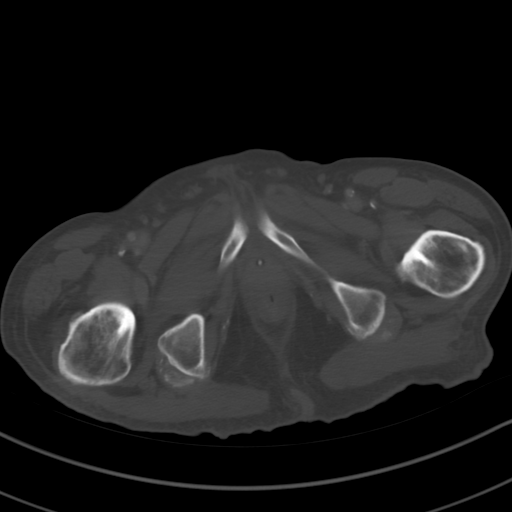
[im 16/97  soft-tissue]
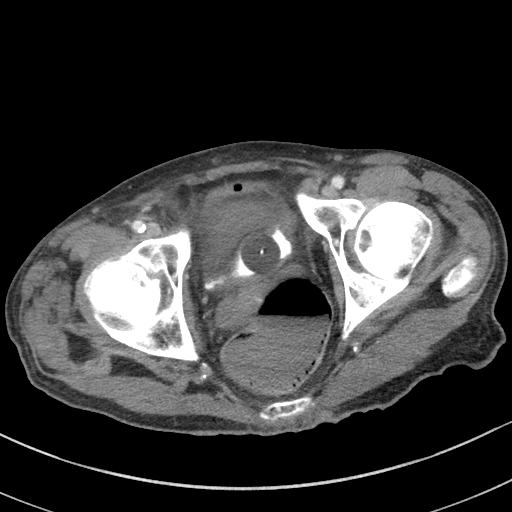
[im 26/97  soft-tissue]
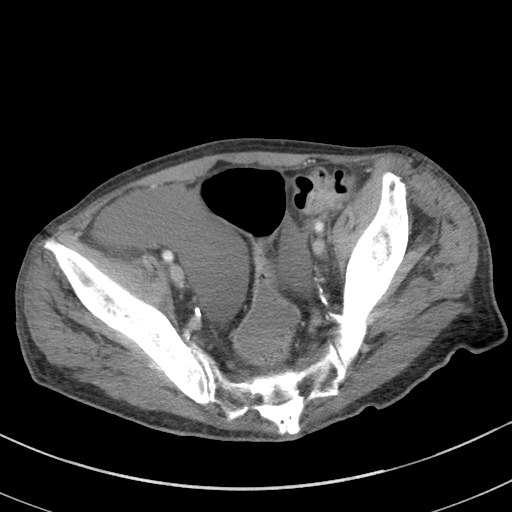
[im 31/97  soft-tissue]
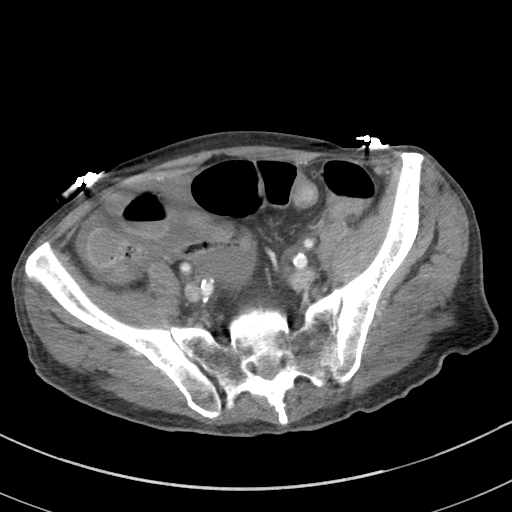
[im 41/97  soft-tissue]
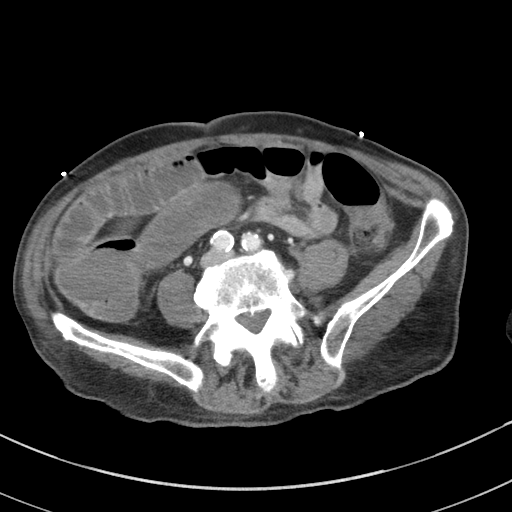
[im 51/97  soft-tissue]
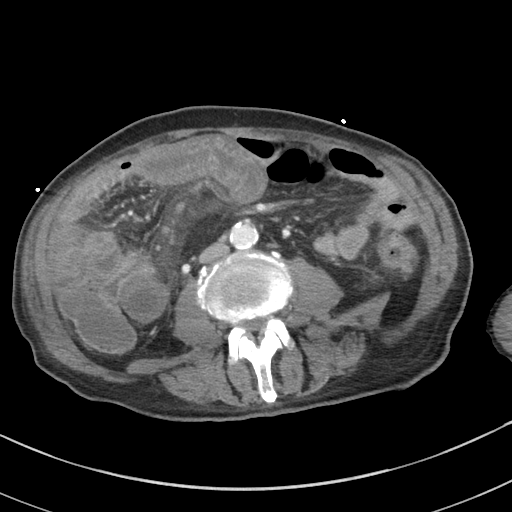
[im 56/97  soft-tissue]
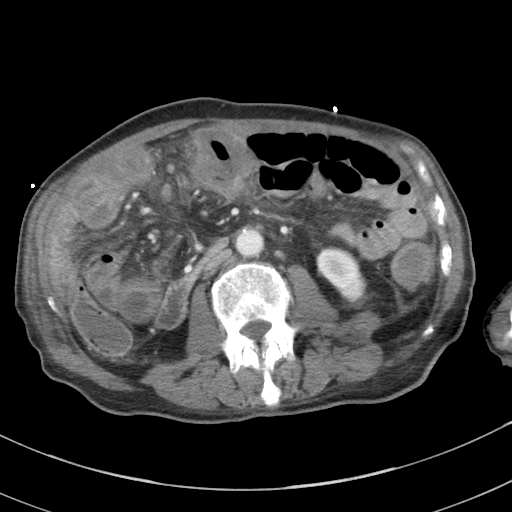
[im 66/97  soft-tissue]
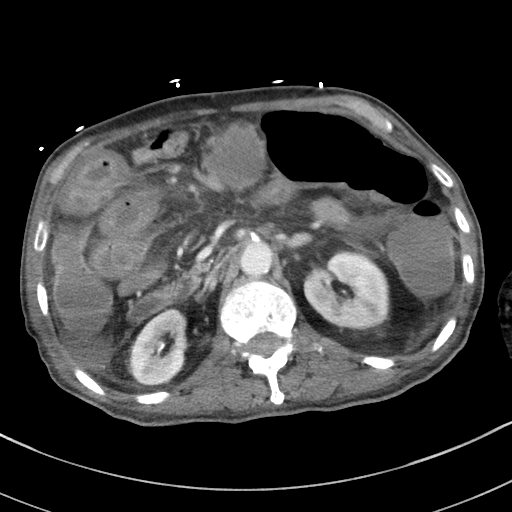
[im 71/97  soft-tissue]
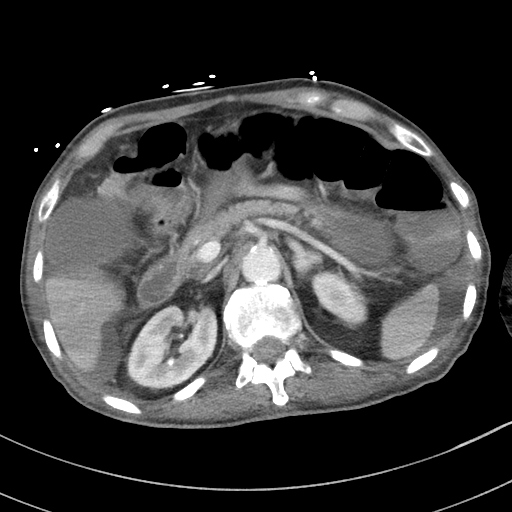
[im 71/97  bone]
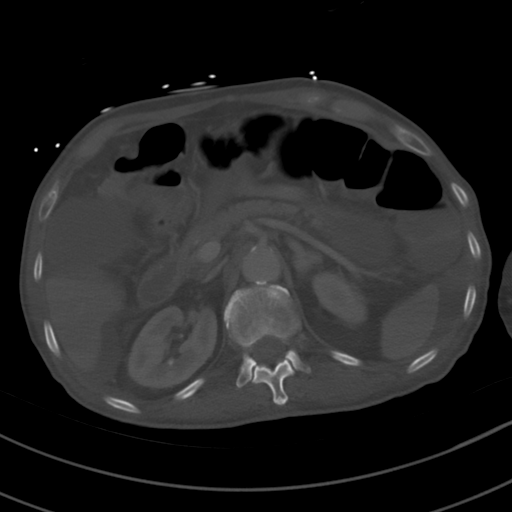
[im 81/97  soft-tissue]
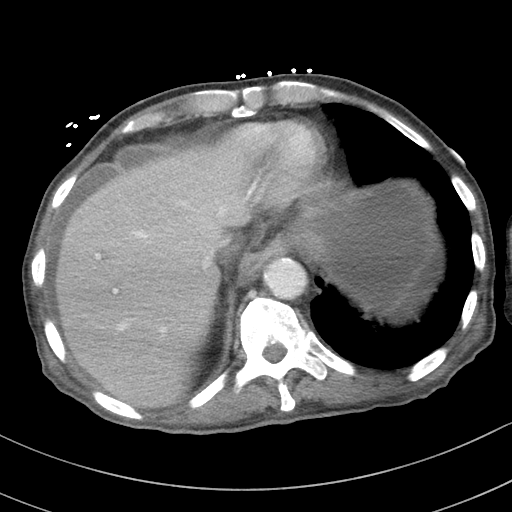
[im 91/97  soft-tissue]
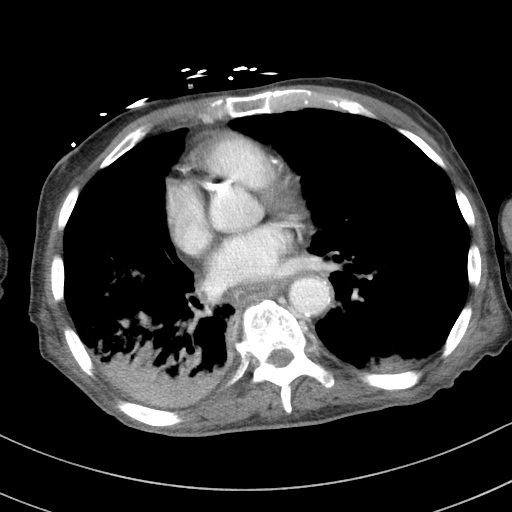

[Series 6: a/p w/ cor · coronal · 0.65mm/px · 3 of 119 slices shown]
[im 40/119  soft-tissue]
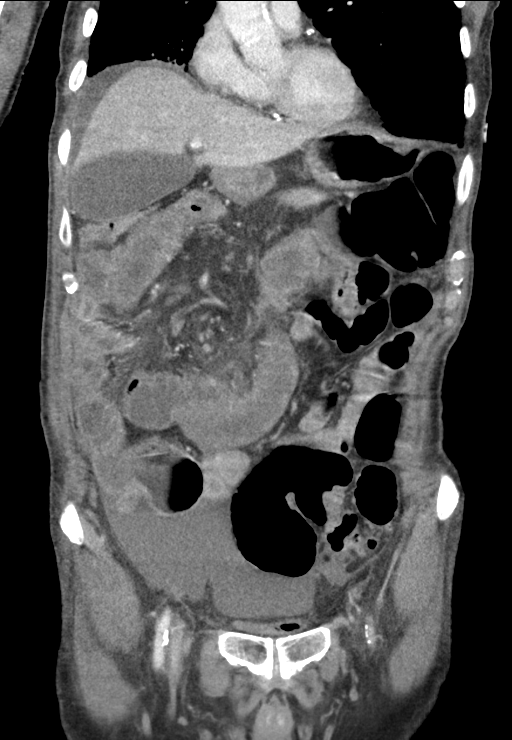
[im 53/119  soft-tissue]
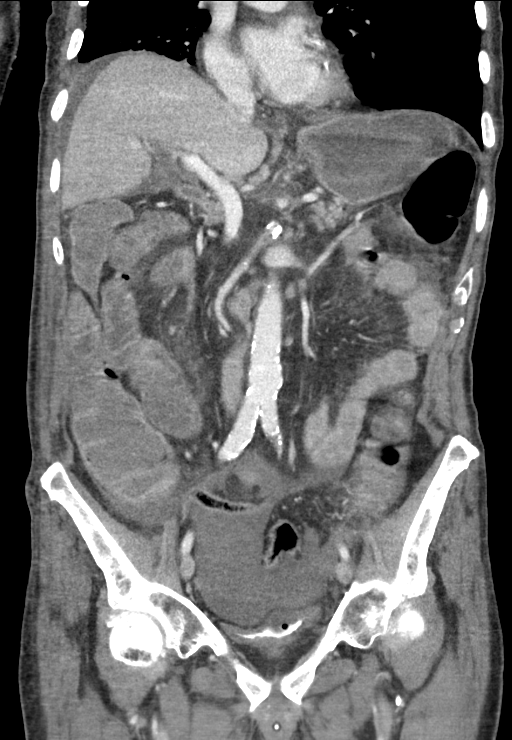
[im 66/119  soft-tissue]
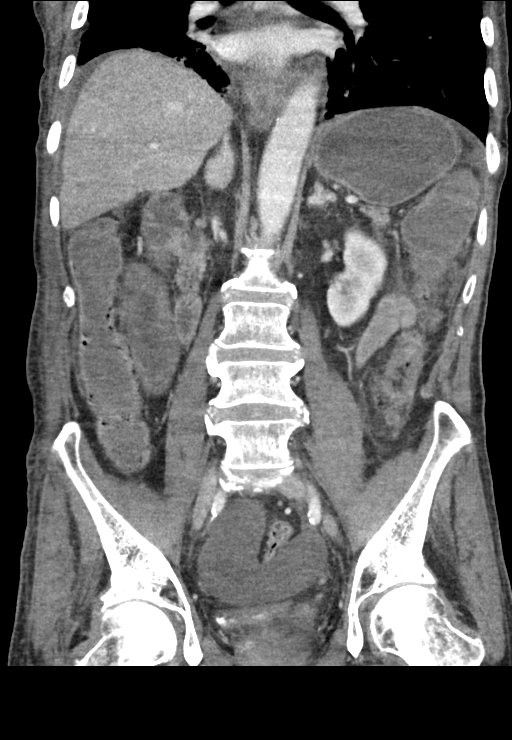

[14 of 46 positions shown; findings below may reference images not displayed]

FINDINGS: Lower chest: Dense basilar consolidation on the right with patchy
opacities and small right effusion. Also with scattered opacities in
the inferior left chest.

Hepatobiliary: No signs of focal hepatic lesion. Gallbladder
distended without signs of pericholecystic inflammation. No biliary
ductal dilation.

Pancreas: Pancreatic atrophy, mild, no ductal dilation or acute
process.

Spleen: Normal appearance of the spleen.

Adrenals/Urinary Tract: Mild bilateral adrenal thickening,
nonspecific.

Signs of excreted contrast with symmetric renal enhancement, no
renal mass. No hydronephrosis.

Stomach/Bowel: Signs of obstructing mass in the mid transverse
colon, circumferential mass measuring approximately 7.9 x 4.8 cm in
greatest axial dimension with wall thickness of 2.5 to 3 cm. Mass is
obstructing in adjacent small bowel loop in the jejunum which it
invades, best seen in the coronal plane. Small bowel loops in the
right hemiabdomen show mural stratification, wall thickening and
some areas of nonenhancement, best seen on axial dataset, image 57
of series 3 the more posteriorly placed small bowel loop does not
appear to show enhancement to the same extent as surrounding small
bowel loops.

The duodenum does not cross midline in this patient. There is a
partial mesenteric twist.

No signs of free air. The appendix is normal. There is free fluid
also along the right flank tracking into the pelvis, right upper
quadrant and to a lesser extent across the abdomen into the left
hemiabdomen. Question thickening also of the descending colon at the
descending/sigmoid junction. There is marked gastric distention with
distended patulous esophagus above.

Vascular/Lymphatic: No signs of acute vascular process. Moderate
calcific atherosclerotic changes throughout the abdominal aorta
extending the iliac vessels. Scattered lymph nodes are seen in the
abdomen.

No signs of pelvic adenopathy.

Reproductive: Prostate heterogeneous. Foley catheter in the urinary
bladder.

Other: No signs of free air with fluid throughout the abdomen as
discussed. Some nodularity adjacent to the primary mass in the
colon.

Musculoskeletal: No signs of acute bone finding or destructive bone
process.
IMPRESSION: 1. Small-bowel obstruction with signs of bowel ischemia secondary to
colonic mass with involvement of adjacent small bowel.
2. Small bowel loops in the right hemiabdomen are jejunal. Findings
suggest malrotation of small bowel with partial volvulus fixed in
position by colonic involvement.
3. No signs of free air at this time though bowel with this
appearance is at risk for perforation.
4. Signs of bilateral pneumonia and/or aspiration related changes.
5. Free fluid without abscess is likely related to ischemic
enteritis.
6. These results were called by telephone at the time of
interpretation on 03/29/2019 at [DATE] to provider DAFINI VEDOVATO ,
who verbally acknowledged these results.

## 2020-06-26 IMAGING — DX DG CHEST 1V PORT
1 series · 2 of 2 positions shown · non-contrast
Comparison: 04/02/2019

CLINICAL DATA: Respiratory failure

EXAM:
PORTABLE CHEST 1 VIEW

[Series 1: chest · 0.14mm/px · 2 of 2 slices shown]
[im 1/2]
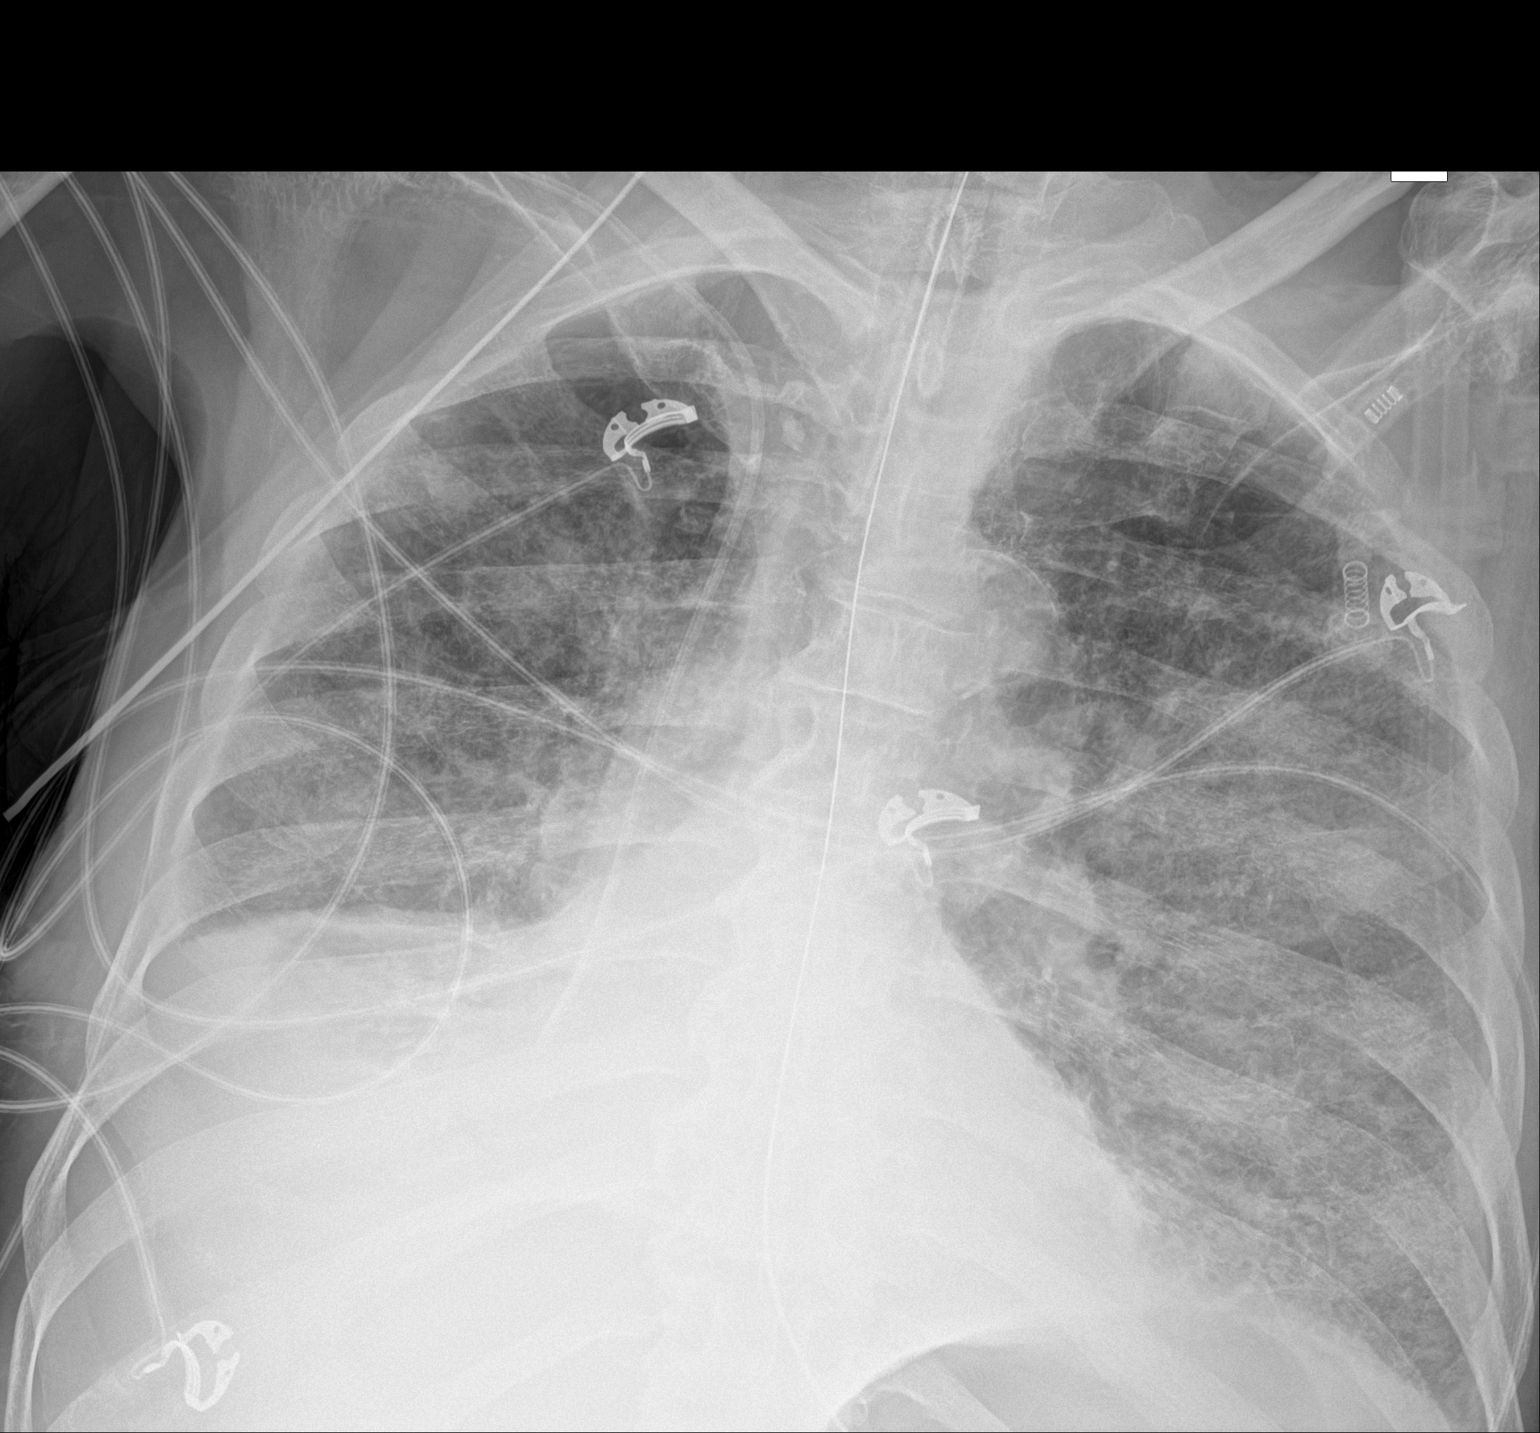
[im 2/2]
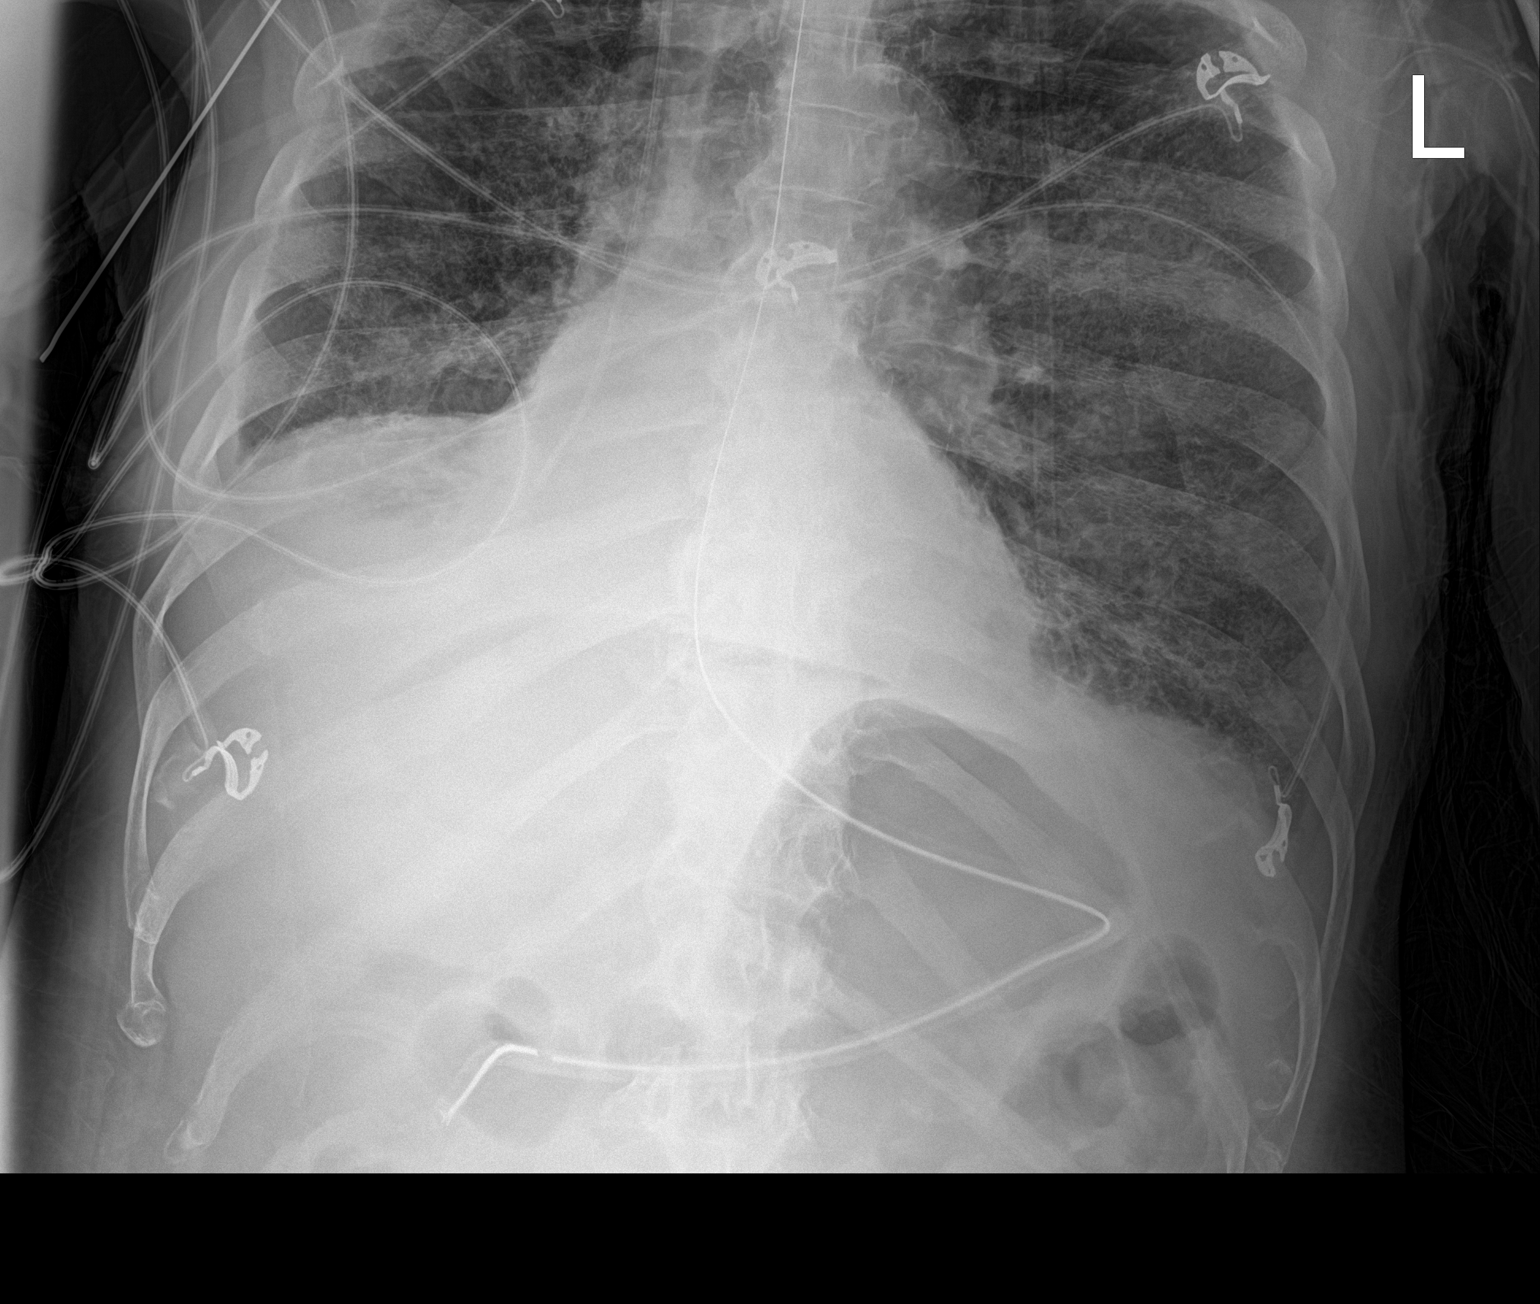

[2 of 2 positions shown; findings below may reference images not displayed]

FINDINGS: No significant change in AP portable examination with endotracheal
tube, esophagogastric tube, and right subclavian vascular catheter.
Esophagogastric tube tip and side port are directed significantly
right of midline likely within the proximal duodenum. Diffuse
bilateral interstitial pulmonary opacity, likely with small layering
pleural effusions. No new airspace opacity. The heart and
mediastinum are unremarkable.
IMPRESSION: 1. No significant change in AP portable examination with
endotracheal tube, esophagogastric tube, and right subclavian
vascular catheter.

2. Esophagogastric tube tip and side port are directed significantly
right of midline likely within the proximal duodenum. Consider
slight retraction to ensure intragastric positioning.

3. Diffuse bilateral interstitial pulmonary opacity, likely with
small layering pleural effusions, consistent with edema or
infection. No new airspace opacity.

## 2020-06-27 IMAGING — DX DG CHEST 1V PORT
1 series · 1 of 1 positions shown · non-contrast
Comparison: Yesterday

CLINICAL DATA: Respiratory failure

EXAM:
PORTABLE CHEST 1 VIEW

[chest]
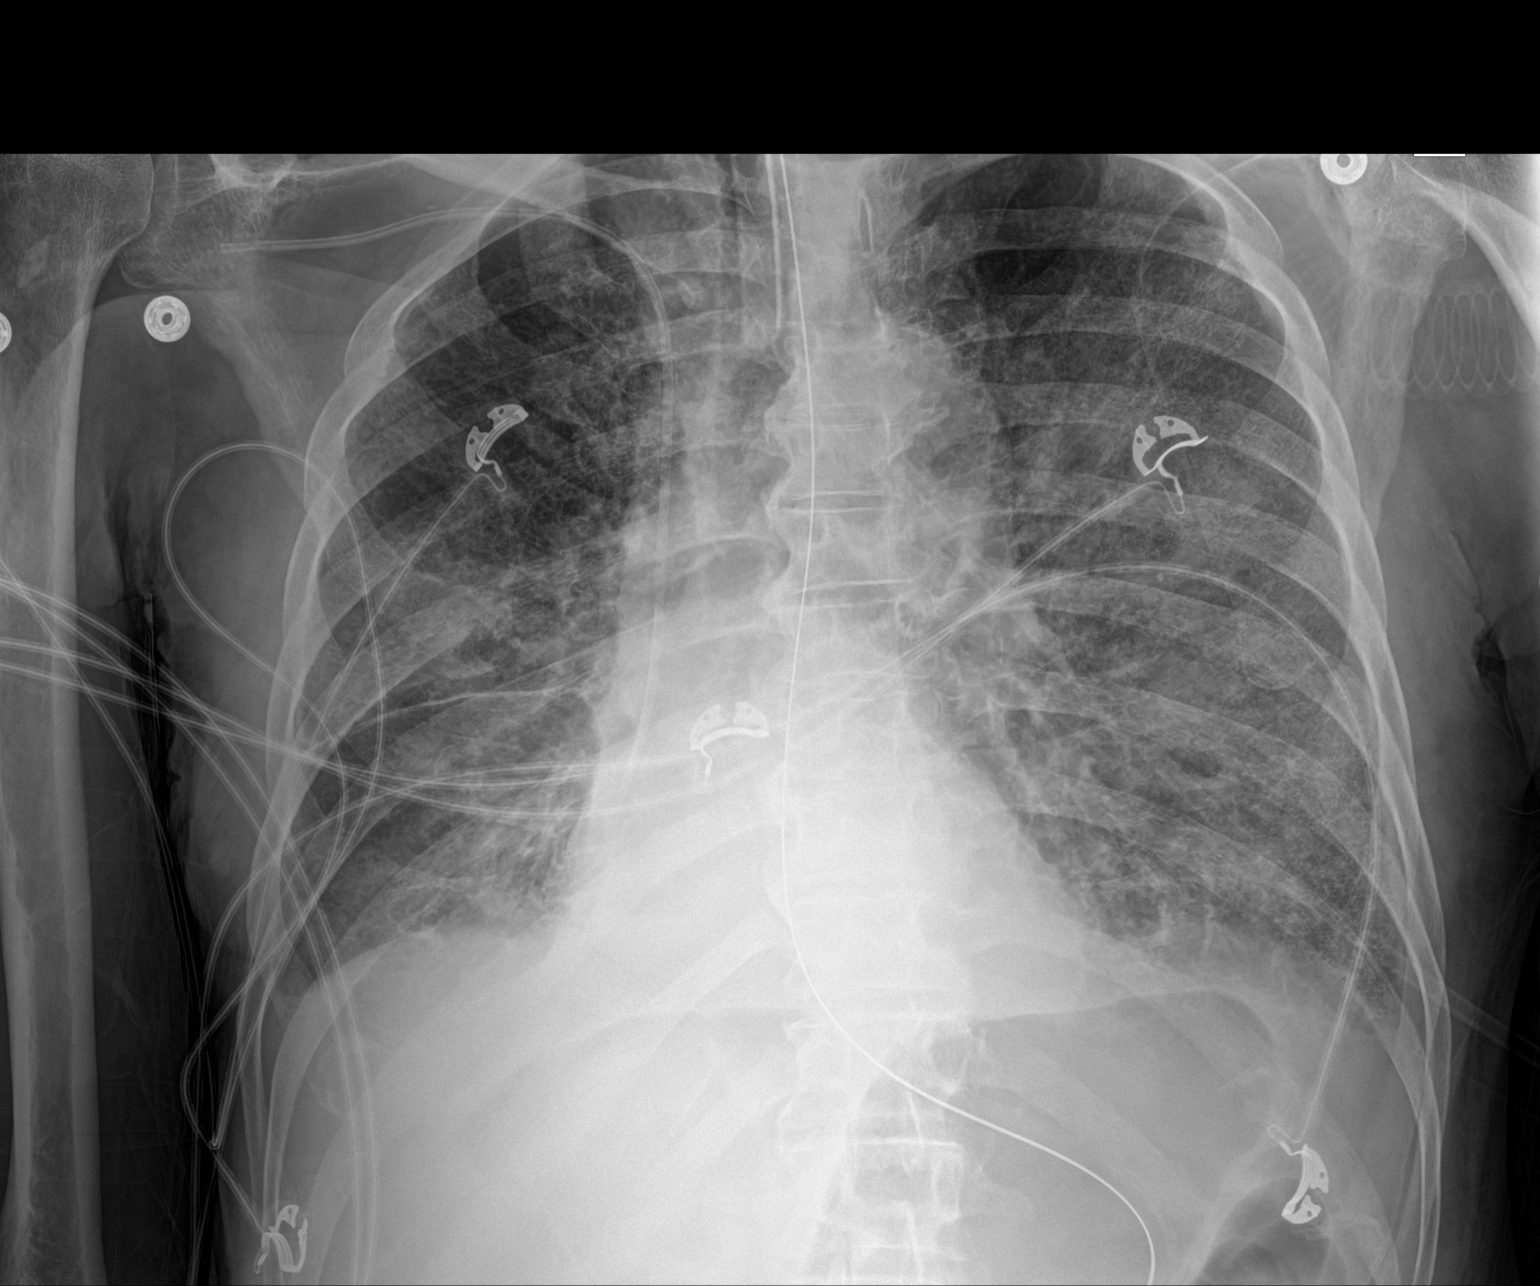

[1 of 1 positions shown; findings below may reference images not displayed]

FINDINGS: Endotracheal tube tip just below the clavicular heads. The
orogastric tube at least reaches the stomach. Right subclavian line
with tip at the upper right atrium.

Extensive bilateral airspace disease. There is improved inflation at
the right base. Normal heart size. Probable small pleural effusions
IMPRESSION: 1. Stable and unremarkable hardware positioning.
2. Improved aeration at the right base but still extensive bilateral
airspace disease.
